# Patient Record
Sex: Female | Born: 1935 | Race: White | Hispanic: No | Smoking: Never smoker
Health system: Southern US, Community
[De-identification: ages and names within clinical notes are randomized; demographics above are authoritative.]

## PROBLEM LIST (undated history)

## (undated) DIAGNOSIS — I10 Essential (primary) hypertension: Secondary | ICD-10-CM

## (undated) DIAGNOSIS — Z9889 Other specified postprocedural states: Secondary | ICD-10-CM

## (undated) DIAGNOSIS — E785 Hyperlipidemia, unspecified: Secondary | ICD-10-CM

## (undated) DIAGNOSIS — F419 Anxiety disorder, unspecified: Secondary | ICD-10-CM

## (undated) DIAGNOSIS — E7800 Pure hypercholesterolemia, unspecified: Secondary | ICD-10-CM

## (undated) DIAGNOSIS — G4730 Sleep apnea, unspecified: Secondary | ICD-10-CM

## (undated) DIAGNOSIS — I739 Peripheral vascular disease, unspecified: Secondary | ICD-10-CM

## (undated) DIAGNOSIS — F32A Depression, unspecified: Secondary | ICD-10-CM

## (undated) DIAGNOSIS — K5790 Diverticulosis of intestine, part unspecified, without perforation or abscess without bleeding: Secondary | ICD-10-CM

## (undated) DIAGNOSIS — R112 Nausea with vomiting, unspecified: Secondary | ICD-10-CM

## (undated) DIAGNOSIS — K219 Gastro-esophageal reflux disease without esophagitis: Secondary | ICD-10-CM

## (undated) HISTORY — PX: HERNIA REPAIR: SHX51

## (undated) HISTORY — PX: CATARACT EXTRACTION: SUR2

## (undated) HISTORY — PX: COLONOSCOPY: SHX174

---

## 1999-01-29 ENCOUNTER — Inpatient Hospital Stay (HOSPITAL_COMMUNITY): Admission: AD | Admit: 1999-01-29 | Discharge: 1999-01-29 | Payer: Self-pay | Admitting: Obstetrics & Gynecology

## 2001-05-01 ENCOUNTER — Ambulatory Visit (HOSPITAL_BASED_OUTPATIENT_CLINIC_OR_DEPARTMENT_OTHER): Admission: RE | Admit: 2001-05-01 | Discharge: 2001-05-01 | Payer: Self-pay | Admitting: *Deleted

## 2002-10-20 ENCOUNTER — Other Ambulatory Visit: Admission: RE | Admit: 2002-10-20 | Discharge: 2002-10-20 | Payer: Self-pay | Admitting: Obstetrics and Gynecology

## 2004-03-01 ENCOUNTER — Other Ambulatory Visit: Admission: RE | Admit: 2004-03-01 | Discharge: 2004-03-01 | Payer: Self-pay | Admitting: Obstetrics and Gynecology

## 2004-10-29 ENCOUNTER — Other Ambulatory Visit: Payer: Self-pay

## 2004-10-29 ENCOUNTER — Emergency Department: Payer: Self-pay | Admitting: Emergency Medicine

## 2005-08-21 ENCOUNTER — Ambulatory Visit: Payer: Self-pay | Admitting: Family Medicine

## 2006-08-23 ENCOUNTER — Ambulatory Visit: Payer: Self-pay | Admitting: Family Medicine

## 2007-01-01 ENCOUNTER — Inpatient Hospital Stay: Payer: Self-pay | Admitting: *Deleted

## 2007-01-27 ENCOUNTER — Emergency Department: Payer: Self-pay | Admitting: Emergency Medicine

## 2007-01-27 ENCOUNTER — Other Ambulatory Visit: Payer: Self-pay

## 2007-02-18 ENCOUNTER — Ambulatory Visit: Payer: Self-pay | Admitting: Unknown Physician Specialty

## 2007-05-29 ENCOUNTER — Emergency Department: Payer: Self-pay | Admitting: Emergency Medicine

## 2007-09-11 ENCOUNTER — Ambulatory Visit: Payer: Self-pay | Admitting: Family Medicine

## 2007-11-21 ENCOUNTER — Emergency Department: Payer: Self-pay | Admitting: Emergency Medicine

## 2007-11-26 ENCOUNTER — Emergency Department: Payer: Self-pay | Admitting: Unknown Physician Specialty

## 2008-02-21 ENCOUNTER — Emergency Department: Payer: Self-pay | Admitting: Emergency Medicine

## 2008-11-10 ENCOUNTER — Ambulatory Visit: Payer: Self-pay | Admitting: Family Medicine

## 2009-04-28 ENCOUNTER — Inpatient Hospital Stay: Payer: Self-pay | Admitting: Internal Medicine

## 2009-06-10 ENCOUNTER — Ambulatory Visit: Payer: Self-pay | Admitting: Family Medicine

## 2010-01-04 ENCOUNTER — Ambulatory Visit: Payer: Self-pay | Admitting: Family Medicine

## 2010-02-16 ENCOUNTER — Ambulatory Visit: Payer: Self-pay | Admitting: Family Medicine

## 2011-01-04 ENCOUNTER — Emergency Department: Payer: Self-pay | Admitting: *Deleted

## 2011-04-11 DIAGNOSIS — Z1339 Encounter for screening examination for other mental health and behavioral disorders: Secondary | ICD-10-CM | POA: Diagnosis not present

## 2011-04-11 DIAGNOSIS — Z23 Encounter for immunization: Secondary | ICD-10-CM | POA: Diagnosis not present

## 2011-04-11 DIAGNOSIS — Z1331 Encounter for screening for depression: Secondary | ICD-10-CM | POA: Diagnosis not present

## 2011-04-11 DIAGNOSIS — Z0000 Encounter for general adult medical examination without abnormal findings: Secondary | ICD-10-CM | POA: Diagnosis not present

## 2011-06-02 ENCOUNTER — Ambulatory Visit: Payer: Self-pay | Admitting: Family Medicine

## 2011-06-02 DIAGNOSIS — Z1231 Encounter for screening mammogram for malignant neoplasm of breast: Secondary | ICD-10-CM | POA: Diagnosis not present

## 2011-06-22 DIAGNOSIS — R1084 Generalized abdominal pain: Secondary | ICD-10-CM | POA: Diagnosis not present

## 2011-06-22 DIAGNOSIS — R112 Nausea with vomiting, unspecified: Secondary | ICD-10-CM | POA: Diagnosis not present

## 2011-06-22 DIAGNOSIS — R109 Unspecified abdominal pain: Secondary | ICD-10-CM | POA: Diagnosis not present

## 2011-06-22 DIAGNOSIS — R509 Fever, unspecified: Secondary | ICD-10-CM | POA: Diagnosis not present

## 2011-06-22 DIAGNOSIS — K5289 Other specified noninfective gastroenteritis and colitis: Secondary | ICD-10-CM | POA: Diagnosis not present

## 2011-06-22 DIAGNOSIS — R197 Diarrhea, unspecified: Secondary | ICD-10-CM | POA: Diagnosis not present

## 2011-07-05 ENCOUNTER — Emergency Department: Payer: Self-pay | Admitting: Unknown Physician Specialty

## 2011-07-05 DIAGNOSIS — K449 Diaphragmatic hernia without obstruction or gangrene: Secondary | ICD-10-CM | POA: Diagnosis not present

## 2011-07-05 DIAGNOSIS — R6889 Other general symptoms and signs: Secondary | ICD-10-CM | POA: Diagnosis not present

## 2011-07-05 DIAGNOSIS — Z7982 Long term (current) use of aspirin: Secondary | ICD-10-CM | POA: Diagnosis not present

## 2011-07-05 DIAGNOSIS — R109 Unspecified abdominal pain: Secondary | ICD-10-CM | POA: Diagnosis not present

## 2011-07-05 DIAGNOSIS — Z79899 Other long term (current) drug therapy: Secondary | ICD-10-CM | POA: Diagnosis not present

## 2011-07-05 DIAGNOSIS — R197 Diarrhea, unspecified: Secondary | ICD-10-CM | POA: Diagnosis not present

## 2011-07-05 DIAGNOSIS — E869 Volume depletion, unspecified: Secondary | ICD-10-CM | POA: Diagnosis not present

## 2011-07-05 LAB — CBC
MCH: 29.4 pg (ref 26.0–34.0)
MCHC: 32.4 g/dL (ref 32.0–36.0)
RBC: 4.41 10*6/uL (ref 3.80–5.20)
WBC: 11.8 10*3/uL — ABNORMAL HIGH (ref 3.6–11.0)

## 2011-07-05 LAB — COMPREHENSIVE METABOLIC PANEL
Albumin: 3.4 g/dL (ref 3.4–5.0)
Alkaline Phosphatase: 53 U/L (ref 50–136)
Chloride: 106 mmol/L (ref 98–107)
Co2: 25 mmol/L (ref 21–32)
Creatinine: 0.9 mg/dL (ref 0.60–1.30)
EGFR (African American): >60
EGFR (Non-African Amer.): >60
Osmolality: 282 (ref 275–301)
SGOT(AST): 26 U/L (ref 15–37)
Total Protein: 7.1 g/dL (ref 6.4–8.2)

## 2011-07-07 LAB — STOOL CULTURE

## 2011-07-10 DIAGNOSIS — F411 Generalized anxiety disorder: Secondary | ICD-10-CM | POA: Diagnosis not present

## 2011-07-10 DIAGNOSIS — F329 Major depressive disorder, single episode, unspecified: Secondary | ICD-10-CM | POA: Diagnosis not present

## 2011-07-10 DIAGNOSIS — K2100 Gastro-esophageal reflux disease with esophagitis, without bleeding: Secondary | ICD-10-CM | POA: Diagnosis not present

## 2011-07-10 DIAGNOSIS — R911 Solitary pulmonary nodule: Secondary | ICD-10-CM | POA: Diagnosis not present

## 2011-07-10 DIAGNOSIS — F3289 Other specified depressive episodes: Secondary | ICD-10-CM | POA: Diagnosis not present

## 2011-10-30 DIAGNOSIS — G4733 Obstructive sleep apnea (adult) (pediatric): Secondary | ICD-10-CM | POA: Diagnosis not present

## 2011-10-30 DIAGNOSIS — F3289 Other specified depressive episodes: Secondary | ICD-10-CM | POA: Diagnosis not present

## 2011-10-30 DIAGNOSIS — E785 Hyperlipidemia, unspecified: Secondary | ICD-10-CM | POA: Diagnosis not present

## 2011-10-30 DIAGNOSIS — F411 Generalized anxiety disorder: Secondary | ICD-10-CM | POA: Diagnosis not present

## 2011-10-30 DIAGNOSIS — F329 Major depressive disorder, single episode, unspecified: Secondary | ICD-10-CM | POA: Diagnosis not present

## 2011-12-02 DIAGNOSIS — Z23 Encounter for immunization: Secondary | ICD-10-CM | POA: Diagnosis not present

## 2011-12-02 DIAGNOSIS — G4733 Obstructive sleep apnea (adult) (pediatric): Secondary | ICD-10-CM | POA: Diagnosis not present

## 2011-12-02 DIAGNOSIS — E785 Hyperlipidemia, unspecified: Secondary | ICD-10-CM | POA: Diagnosis not present

## 2012-02-27 DIAGNOSIS — Z23 Encounter for immunization: Secondary | ICD-10-CM | POA: Diagnosis not present

## 2012-02-27 DIAGNOSIS — Z1331 Encounter for screening for depression: Secondary | ICD-10-CM | POA: Diagnosis not present

## 2012-02-27 DIAGNOSIS — G4733 Obstructive sleep apnea (adult) (pediatric): Secondary | ICD-10-CM | POA: Diagnosis not present

## 2012-02-27 DIAGNOSIS — E785 Hyperlipidemia, unspecified: Secondary | ICD-10-CM | POA: Diagnosis not present

## 2012-02-27 DIAGNOSIS — Z1339 Encounter for screening examination for other mental health and behavioral disorders: Secondary | ICD-10-CM | POA: Diagnosis not present

## 2012-02-27 DIAGNOSIS — Z0000 Encounter for general adult medical examination without abnormal findings: Secondary | ICD-10-CM | POA: Diagnosis not present

## 2012-03-11 ENCOUNTER — Ambulatory Visit: Payer: Self-pay | Admitting: Family Medicine

## 2012-03-11 DIAGNOSIS — N951 Menopausal and female climacteric states: Secondary | ICD-10-CM | POA: Diagnosis not present

## 2012-03-11 DIAGNOSIS — M899 Disorder of bone, unspecified: Secondary | ICD-10-CM | POA: Diagnosis not present

## 2012-04-19 DIAGNOSIS — H2510 Age-related nuclear cataract, unspecified eye: Secondary | ICD-10-CM | POA: Diagnosis not present

## 2012-04-25 DIAGNOSIS — F3289 Other specified depressive episodes: Secondary | ICD-10-CM | POA: Diagnosis not present

## 2012-04-25 DIAGNOSIS — I10 Essential (primary) hypertension: Secondary | ICD-10-CM | POA: Diagnosis not present

## 2012-04-25 DIAGNOSIS — E7800 Pure hypercholesterolemia, unspecified: Secondary | ICD-10-CM | POA: Diagnosis not present

## 2012-04-25 DIAGNOSIS — F411 Generalized anxiety disorder: Secondary | ICD-10-CM | POA: Diagnosis not present

## 2012-04-26 DIAGNOSIS — R5381 Other malaise: Secondary | ICD-10-CM | POA: Diagnosis not present

## 2012-04-26 DIAGNOSIS — E7800 Pure hypercholesterolemia, unspecified: Secondary | ICD-10-CM | POA: Diagnosis not present

## 2012-05-20 DIAGNOSIS — E7800 Pure hypercholesterolemia, unspecified: Secondary | ICD-10-CM | POA: Diagnosis not present

## 2012-05-20 DIAGNOSIS — R350 Frequency of micturition: Secondary | ICD-10-CM | POA: Diagnosis not present

## 2012-05-20 DIAGNOSIS — R002 Palpitations: Secondary | ICD-10-CM | POA: Diagnosis not present

## 2012-05-20 DIAGNOSIS — I10 Essential (primary) hypertension: Secondary | ICD-10-CM | POA: Diagnosis not present

## 2012-06-26 ENCOUNTER — Emergency Department: Payer: Self-pay

## 2012-06-26 DIAGNOSIS — I10 Essential (primary) hypertension: Secondary | ICD-10-CM | POA: Diagnosis not present

## 2012-06-26 DIAGNOSIS — R51 Headache: Secondary | ICD-10-CM | POA: Diagnosis not present

## 2012-06-26 DIAGNOSIS — Z888 Allergy status to other drugs, medicaments and biological substances status: Secondary | ICD-10-CM | POA: Diagnosis not present

## 2012-06-26 DIAGNOSIS — R6889 Other general symptoms and signs: Secondary | ICD-10-CM | POA: Diagnosis not present

## 2012-06-26 LAB — COMPREHENSIVE METABOLIC PANEL
Albumin: 3.7 g/dL (ref 3.4–5.0)
BUN: 15 mg/dL (ref 7–18)
Co2: 28 mmol/L (ref 21–32)
Creatinine: 0.83 mg/dL (ref 0.60–1.30)
EGFR (African American): >60
EGFR (Non-African Amer.): >60
Glucose: 92 mg/dL (ref 65–99)
Osmolality: 276 (ref 275–301)
Potassium: 3.9 mmol/L (ref 3.5–5.1)
SGOT(AST): 25 U/L (ref 15–37)
Sodium: 138 mmol/L (ref 136–145)
Total Protein: 7.7 g/dL (ref 6.4–8.2)

## 2012-06-26 LAB — URINALYSIS, COMPLETE
Bacteria: NONE SEEN
Glucose,UR: NEGATIVE mg/dL (ref 0–75)
Leukocyte Esterase: NEGATIVE
Nitrite: NEGATIVE
Ph: 6 (ref 4.5–8.0)
Protein: NEGATIVE
RBC,UR: 2 /HPF (ref 0–5)
WBC UR: 1 /HPF (ref 0–5)

## 2012-06-26 LAB — CBC WITH DIFFERENTIAL/PLATELET
Basophil #: 0.1 10*3/uL (ref 0.0–0.1)
Basophil %: 1.2 %
Eosinophil #: 0.5 10*3/uL (ref 0.0–0.7)
HCT: 40 % (ref 35.0–47.0)
Lymphocyte #: 0.6 10*3/uL — ABNORMAL LOW (ref 1.0–3.6)
MCH: 30 pg (ref 26.0–34.0)
MCHC: 33.8 g/dL (ref 32.0–36.0)
Monocyte %: 19.1 %
Neutrophil #: 5.4 10*3/uL (ref 1.4–6.5)
Platelet: 244 10*3/uL (ref 150–440)
RBC: 4.51 10*6/uL (ref 3.80–5.20)

## 2012-06-27 ENCOUNTER — Ambulatory Visit: Payer: Self-pay | Admitting: Family Medicine

## 2012-06-27 DIAGNOSIS — F329 Major depressive disorder, single episode, unspecified: Secondary | ICD-10-CM | POA: Diagnosis not present

## 2012-06-27 DIAGNOSIS — R509 Fever, unspecified: Secondary | ICD-10-CM | POA: Diagnosis not present

## 2012-06-27 DIAGNOSIS — F3289 Other specified depressive episodes: Secondary | ICD-10-CM | POA: Diagnosis not present

## 2012-06-27 DIAGNOSIS — J029 Acute pharyngitis, unspecified: Secondary | ICD-10-CM | POA: Diagnosis not present

## 2012-06-27 DIAGNOSIS — R059 Cough, unspecified: Secondary | ICD-10-CM | POA: Diagnosis not present

## 2012-06-27 DIAGNOSIS — R51 Headache: Secondary | ICD-10-CM | POA: Diagnosis not present

## 2012-06-27 DIAGNOSIS — R05 Cough: Secondary | ICD-10-CM | POA: Diagnosis not present

## 2012-06-28 LAB — BETA STREP CULTURE(ARMC)

## 2012-07-01 ENCOUNTER — Emergency Department: Payer: Self-pay | Admitting: Emergency Medicine

## 2012-07-01 DIAGNOSIS — R6889 Other general symptoms and signs: Secondary | ICD-10-CM | POA: Diagnosis not present

## 2012-07-01 DIAGNOSIS — Z888 Allergy status to other drugs, medicaments and biological substances status: Secondary | ICD-10-CM | POA: Diagnosis not present

## 2012-07-01 DIAGNOSIS — E785 Hyperlipidemia, unspecified: Secondary | ICD-10-CM | POA: Diagnosis not present

## 2012-07-01 DIAGNOSIS — I10 Essential (primary) hypertension: Secondary | ICD-10-CM | POA: Diagnosis not present

## 2012-07-01 LAB — URINALYSIS, COMPLETE
Bacteria: NONE SEEN
Blood: NEGATIVE
Ketone: NEGATIVE
Leukocyte Esterase: NEGATIVE
Nitrite: NEGATIVE
Ph: 7 (ref 4.5–8.0)
Protein: NEGATIVE
Squamous Epithelial: <1
WBC UR: 1 /HPF (ref 0–5)

## 2012-07-01 LAB — COMPREHENSIVE METABOLIC PANEL
Albumin: 3.5 g/dL (ref 3.4–5.0)
Alkaline Phosphatase: 89 U/L (ref 50–136)
Anion Gap: 9 (ref 7–16)
BUN: 17 mg/dL (ref 7–18)
Bilirubin,Total: 0.4 mg/dL (ref 0.2–1.0)
Calcium, Total: 9.4 mg/dL (ref 8.5–10.1)
Creatinine: 0.97 mg/dL (ref 0.60–1.30)
EGFR (African American): >60
EGFR (Non-African Amer.): 56 — ABNORMAL LOW
Glucose: 95 mg/dL (ref 65–99)
Potassium: 3.8 mmol/L (ref 3.5–5.1)
SGOT(AST): 24 U/L (ref 15–37)
SGPT (ALT): 24 U/L (ref 12–78)
Sodium: 140 mmol/L (ref 136–145)
Total Protein: 7.4 g/dL (ref 6.4–8.2)

## 2012-07-01 LAB — CBC
HCT: 39.9 % (ref 35.0–47.0)
MCH: 30.6 pg (ref 26.0–34.0)
MCHC: 34.7 g/dL (ref 32.0–36.0)
MCV: 88 fL (ref 80–100)
Platelet: 246 10*3/uL (ref 150–440)
WBC: 6 10*3/uL (ref 3.6–11.0)

## 2012-07-01 LAB — CK TOTAL AND CKMB (NOT AT ARMC)
CK, Total: 55 U/L (ref 21–215)
CK, Total: 47 U/L (ref 21–215)
CK-MB: 0.6 ng/mL (ref 0.5–3.6)

## 2012-07-01 LAB — TROPONIN I: Troponin-I: <0.02 ng/mL

## 2012-07-02 DIAGNOSIS — R079 Chest pain, unspecified: Secondary | ICD-10-CM | POA: Diagnosis not present

## 2012-07-02 DIAGNOSIS — R0609 Other forms of dyspnea: Secondary | ICD-10-CM | POA: Diagnosis not present

## 2012-07-02 DIAGNOSIS — R0989 Other specified symptoms and signs involving the circulatory and respiratory systems: Secondary | ICD-10-CM | POA: Diagnosis not present

## 2012-07-02 DIAGNOSIS — R9431 Abnormal electrocardiogram [ECG] [EKG]: Secondary | ICD-10-CM | POA: Diagnosis not present

## 2012-07-02 DIAGNOSIS — I10 Essential (primary) hypertension: Secondary | ICD-10-CM | POA: Diagnosis not present

## 2012-07-05 DIAGNOSIS — R0609 Other forms of dyspnea: Secondary | ICD-10-CM | POA: Diagnosis not present

## 2012-07-05 DIAGNOSIS — R0989 Other specified symptoms and signs involving the circulatory and respiratory systems: Secondary | ICD-10-CM | POA: Diagnosis not present

## 2012-07-05 DIAGNOSIS — I10 Essential (primary) hypertension: Secondary | ICD-10-CM | POA: Diagnosis not present

## 2012-07-05 DIAGNOSIS — R5381 Other malaise: Secondary | ICD-10-CM | POA: Diagnosis not present

## 2012-07-05 DIAGNOSIS — R5383 Other fatigue: Secondary | ICD-10-CM | POA: Diagnosis not present

## 2012-08-01 DIAGNOSIS — F3289 Other specified depressive episodes: Secondary | ICD-10-CM | POA: Diagnosis not present

## 2012-08-01 DIAGNOSIS — R9431 Abnormal electrocardiogram [ECG] [EKG]: Secondary | ICD-10-CM | POA: Diagnosis not present

## 2012-08-01 DIAGNOSIS — I10 Essential (primary) hypertension: Secondary | ICD-10-CM | POA: Diagnosis not present

## 2012-08-01 DIAGNOSIS — F329 Major depressive disorder, single episode, unspecified: Secondary | ICD-10-CM | POA: Diagnosis not present

## 2012-08-01 DIAGNOSIS — F411 Generalized anxiety disorder: Secondary | ICD-10-CM | POA: Diagnosis not present

## 2012-08-02 DIAGNOSIS — R0602 Shortness of breath: Secondary | ICD-10-CM | POA: Diagnosis not present

## 2012-08-02 DIAGNOSIS — R509 Fever, unspecified: Secondary | ICD-10-CM | POA: Diagnosis not present

## 2012-08-02 DIAGNOSIS — I658 Occlusion and stenosis of other precerebral arteries: Secondary | ICD-10-CM | POA: Diagnosis not present

## 2012-08-12 DIAGNOSIS — I10 Essential (primary) hypertension: Secondary | ICD-10-CM | POA: Diagnosis not present

## 2012-08-12 DIAGNOSIS — E782 Mixed hyperlipidemia: Secondary | ICD-10-CM | POA: Diagnosis not present

## 2012-08-29 ENCOUNTER — Ambulatory Visit: Payer: Self-pay | Admitting: Family Medicine

## 2012-08-29 DIAGNOSIS — F411 Generalized anxiety disorder: Secondary | ICD-10-CM | POA: Diagnosis not present

## 2012-08-29 DIAGNOSIS — Z1231 Encounter for screening mammogram for malignant neoplasm of breast: Secondary | ICD-10-CM | POA: Diagnosis not present

## 2012-08-29 DIAGNOSIS — N3000 Acute cystitis without hematuria: Secondary | ICD-10-CM | POA: Diagnosis not present

## 2012-08-29 DIAGNOSIS — R9431 Abnormal electrocardiogram [ECG] [EKG]: Secondary | ICD-10-CM | POA: Diagnosis not present

## 2012-08-29 DIAGNOSIS — F329 Major depressive disorder, single episode, unspecified: Secondary | ICD-10-CM | POA: Diagnosis not present

## 2012-08-29 DIAGNOSIS — F3289 Other specified depressive episodes: Secondary | ICD-10-CM | POA: Diagnosis not present

## 2012-11-02 DIAGNOSIS — R9431 Abnormal electrocardiogram [ECG] [EKG]: Secondary | ICD-10-CM | POA: Diagnosis not present

## 2012-11-02 DIAGNOSIS — F3289 Other specified depressive episodes: Secondary | ICD-10-CM | POA: Diagnosis not present

## 2012-11-02 DIAGNOSIS — F329 Major depressive disorder, single episode, unspecified: Secondary | ICD-10-CM | POA: Diagnosis not present

## 2012-11-02 DIAGNOSIS — F411 Generalized anxiety disorder: Secondary | ICD-10-CM | POA: Diagnosis not present

## 2012-11-02 DIAGNOSIS — Z23 Encounter for immunization: Secondary | ICD-10-CM | POA: Diagnosis not present

## 2012-11-02 DIAGNOSIS — N3000 Acute cystitis without hematuria: Secondary | ICD-10-CM | POA: Diagnosis not present

## 2013-02-04 DIAGNOSIS — Z23 Encounter for immunization: Secondary | ICD-10-CM | POA: Diagnosis not present

## 2013-02-04 DIAGNOSIS — F411 Generalized anxiety disorder: Secondary | ICD-10-CM | POA: Diagnosis not present

## 2013-02-04 DIAGNOSIS — K2100 Gastro-esophageal reflux disease with esophagitis, without bleeding: Secondary | ICD-10-CM | POA: Diagnosis not present

## 2013-02-04 DIAGNOSIS — I10 Essential (primary) hypertension: Secondary | ICD-10-CM | POA: Diagnosis not present

## 2013-02-04 DIAGNOSIS — F320 Major depressive disorder, single episode, mild: Secondary | ICD-10-CM | POA: Diagnosis not present

## 2013-04-21 DIAGNOSIS — E782 Mixed hyperlipidemia: Secondary | ICD-10-CM | POA: Diagnosis not present

## 2013-04-21 DIAGNOSIS — I10 Essential (primary) hypertension: Secondary | ICD-10-CM | POA: Diagnosis not present

## 2013-04-22 DIAGNOSIS — H2510 Age-related nuclear cataract, unspecified eye: Secondary | ICD-10-CM | POA: Diagnosis not present

## 2013-05-27 DIAGNOSIS — H2510 Age-related nuclear cataract, unspecified eye: Secondary | ICD-10-CM | POA: Diagnosis not present

## 2013-05-29 ENCOUNTER — Ambulatory Visit: Payer: Self-pay | Admitting: Ophthalmology

## 2013-05-29 DIAGNOSIS — H25049 Posterior subcapsular polar age-related cataract, unspecified eye: Secondary | ICD-10-CM | POA: Diagnosis not present

## 2013-05-29 DIAGNOSIS — Z01810 Encounter for preprocedural cardiovascular examination: Secondary | ICD-10-CM | POA: Diagnosis not present

## 2013-05-29 DIAGNOSIS — I10 Essential (primary) hypertension: Secondary | ICD-10-CM | POA: Diagnosis not present

## 2013-06-02 ENCOUNTER — Ambulatory Visit: Payer: Self-pay | Admitting: Ophthalmology

## 2013-06-02 DIAGNOSIS — Z79899 Other long term (current) drug therapy: Secondary | ICD-10-CM | POA: Diagnosis not present

## 2013-06-02 DIAGNOSIS — H269 Unspecified cataract: Secondary | ICD-10-CM | POA: Diagnosis not present

## 2013-06-02 DIAGNOSIS — H25049 Posterior subcapsular polar age-related cataract, unspecified eye: Secondary | ICD-10-CM | POA: Diagnosis not present

## 2013-06-02 DIAGNOSIS — E7800 Pure hypercholesterolemia, unspecified: Secondary | ICD-10-CM | POA: Diagnosis not present

## 2013-06-02 DIAGNOSIS — H2510 Age-related nuclear cataract, unspecified eye: Secondary | ICD-10-CM | POA: Diagnosis not present

## 2013-06-02 DIAGNOSIS — G4730 Sleep apnea, unspecified: Secondary | ICD-10-CM | POA: Diagnosis not present

## 2013-06-02 DIAGNOSIS — K219 Gastro-esophageal reflux disease without esophagitis: Secondary | ICD-10-CM | POA: Diagnosis not present

## 2013-06-02 DIAGNOSIS — I10 Essential (primary) hypertension: Secondary | ICD-10-CM | POA: Diagnosis not present

## 2013-06-13 DIAGNOSIS — H2510 Age-related nuclear cataract, unspecified eye: Secondary | ICD-10-CM | POA: Diagnosis not present

## 2013-06-23 ENCOUNTER — Ambulatory Visit: Payer: Self-pay | Admitting: Ophthalmology

## 2013-06-23 DIAGNOSIS — G4730 Sleep apnea, unspecified: Secondary | ICD-10-CM | POA: Diagnosis not present

## 2013-06-23 DIAGNOSIS — F329 Major depressive disorder, single episode, unspecified: Secondary | ICD-10-CM | POA: Diagnosis not present

## 2013-06-23 DIAGNOSIS — H269 Unspecified cataract: Secondary | ICD-10-CM | POA: Diagnosis not present

## 2013-06-23 DIAGNOSIS — Z881 Allergy status to other antibiotic agents status: Secondary | ICD-10-CM | POA: Diagnosis not present

## 2013-06-23 DIAGNOSIS — I10 Essential (primary) hypertension: Secondary | ICD-10-CM | POA: Diagnosis not present

## 2013-06-23 DIAGNOSIS — Z79899 Other long term (current) drug therapy: Secondary | ICD-10-CM | POA: Diagnosis not present

## 2013-06-23 DIAGNOSIS — E7800 Pure hypercholesterolemia, unspecified: Secondary | ICD-10-CM | POA: Diagnosis not present

## 2013-06-23 DIAGNOSIS — H2510 Age-related nuclear cataract, unspecified eye: Secondary | ICD-10-CM | POA: Diagnosis not present

## 2013-06-23 DIAGNOSIS — F3289 Other specified depressive episodes: Secondary | ICD-10-CM | POA: Diagnosis not present

## 2013-08-04 DIAGNOSIS — F320 Major depressive disorder, single episode, mild: Secondary | ICD-10-CM | POA: Diagnosis not present

## 2013-08-04 DIAGNOSIS — F411 Generalized anxiety disorder: Secondary | ICD-10-CM | POA: Diagnosis not present

## 2013-08-04 DIAGNOSIS — I10 Essential (primary) hypertension: Secondary | ICD-10-CM | POA: Diagnosis not present

## 2013-08-04 DIAGNOSIS — Z23 Encounter for immunization: Secondary | ICD-10-CM | POA: Diagnosis not present

## 2013-08-04 DIAGNOSIS — K2100 Gastro-esophageal reflux disease with esophagitis, without bleeding: Secondary | ICD-10-CM | POA: Diagnosis not present

## 2013-08-06 DIAGNOSIS — D649 Anemia, unspecified: Secondary | ICD-10-CM | POA: Diagnosis not present

## 2013-08-06 DIAGNOSIS — E7800 Pure hypercholesterolemia, unspecified: Secondary | ICD-10-CM | POA: Diagnosis not present

## 2013-08-06 DIAGNOSIS — R5383 Other fatigue: Secondary | ICD-10-CM | POA: Diagnosis not present

## 2013-08-06 DIAGNOSIS — R5381 Other malaise: Secondary | ICD-10-CM | POA: Diagnosis not present

## 2013-10-28 DIAGNOSIS — K2100 Gastro-esophageal reflux disease with esophagitis, without bleeding: Secondary | ICD-10-CM | POA: Diagnosis not present

## 2013-10-28 DIAGNOSIS — Z111 Encounter for screening for respiratory tuberculosis: Secondary | ICD-10-CM | POA: Diagnosis not present

## 2013-10-28 DIAGNOSIS — Z0000 Encounter for general adult medical examination without abnormal findings: Secondary | ICD-10-CM | POA: Diagnosis not present

## 2013-10-28 DIAGNOSIS — F411 Generalized anxiety disorder: Secondary | ICD-10-CM | POA: Diagnosis not present

## 2013-10-28 DIAGNOSIS — Z23 Encounter for immunization: Secondary | ICD-10-CM | POA: Diagnosis not present

## 2013-10-28 DIAGNOSIS — I10 Essential (primary) hypertension: Secondary | ICD-10-CM | POA: Diagnosis not present

## 2013-10-28 DIAGNOSIS — Z1339 Encounter for screening examination for other mental health and behavioral disorders: Secondary | ICD-10-CM | POA: Diagnosis not present

## 2013-10-28 DIAGNOSIS — Z1331 Encounter for screening for depression: Secondary | ICD-10-CM | POA: Diagnosis not present

## 2013-11-24 ENCOUNTER — Ambulatory Visit: Payer: Self-pay | Admitting: Family Medicine

## 2013-11-24 DIAGNOSIS — Z1231 Encounter for screening mammogram for malignant neoplasm of breast: Secondary | ICD-10-CM | POA: Diagnosis not present

## 2013-12-16 DIAGNOSIS — E780 Pure hypercholesterolemia: Secondary | ICD-10-CM | POA: Diagnosis not present

## 2013-12-16 DIAGNOSIS — I10 Essential (primary) hypertension: Secondary | ICD-10-CM | POA: Diagnosis not present

## 2013-12-16 DIAGNOSIS — F419 Anxiety disorder, unspecified: Secondary | ICD-10-CM | POA: Diagnosis not present

## 2013-12-16 DIAGNOSIS — I495 Sick sinus syndrome: Secondary | ICD-10-CM | POA: Diagnosis not present

## 2013-12-16 DIAGNOSIS — G4730 Sleep apnea, unspecified: Secondary | ICD-10-CM | POA: Insufficient documentation

## 2013-12-26 DIAGNOSIS — Z961 Presence of intraocular lens: Secondary | ICD-10-CM | POA: Diagnosis not present

## 2014-04-27 ENCOUNTER — Ambulatory Visit: Payer: Self-pay | Admitting: Family Medicine

## 2014-04-27 DIAGNOSIS — R05 Cough: Secondary | ICD-10-CM | POA: Diagnosis not present

## 2014-04-27 DIAGNOSIS — I10 Essential (primary) hypertension: Secondary | ICD-10-CM | POA: Diagnosis not present

## 2014-04-27 DIAGNOSIS — K219 Gastro-esophageal reflux disease without esophagitis: Secondary | ICD-10-CM | POA: Diagnosis not present

## 2014-04-27 DIAGNOSIS — R062 Wheezing: Secondary | ICD-10-CM | POA: Diagnosis not present

## 2014-04-27 DIAGNOSIS — J309 Allergic rhinitis, unspecified: Secondary | ICD-10-CM | POA: Diagnosis not present

## 2014-04-27 DIAGNOSIS — F329 Major depressive disorder, single episode, unspecified: Secondary | ICD-10-CM | POA: Diagnosis not present

## 2014-04-28 DIAGNOSIS — D649 Anemia, unspecified: Secondary | ICD-10-CM | POA: Diagnosis not present

## 2014-04-28 DIAGNOSIS — I10 Essential (primary) hypertension: Secondary | ICD-10-CM | POA: Diagnosis not present

## 2014-04-28 DIAGNOSIS — E780 Pure hypercholesterolemia: Secondary | ICD-10-CM | POA: Diagnosis not present

## 2014-04-28 LAB — LIPID PANEL
CHOLESTEROL: 179 mg/dL (ref 0–200)
HDL: 49 mg/dL (ref 35–70)
LDL CALC: 110 mg/dL
TRIGLYCERIDES: 101 mg/dL (ref 40–160)

## 2014-04-28 LAB — CBC AND DIFFERENTIAL
HCT: 38 % (ref 36–46)
Hemoglobin: 12.6 g/dL (ref 12.0–16.0)
NEUTROS ABS: 46 /uL
PLATELETS: 292 10*3/uL (ref 150–399)
WBC: 6.5 10^3/mL

## 2014-04-28 LAB — HEPATIC FUNCTION PANEL
ALT: 11 U/L (ref 7–35)
AST: 14 U/L (ref 13–35)
Alkaline Phosphatase: 72 U/L (ref 25–125)
Bilirubin, Total: 0.3 mg/dL

## 2014-04-28 LAB — BASIC METABOLIC PANEL
BUN: 15 mg/dL (ref 4–21)
Creatinine: 0.9 mg/dL (ref 0.5–1.1)
GLUCOSE: 94 mg/dL
Potassium: 4.4 mmol/L (ref 3.4–5.3)
Sodium: 143 mmol/L (ref 137–147)

## 2014-04-28 LAB — TSH: TSH: 1.52 u[IU]/mL (ref 0.41–5.90)

## 2014-05-11 DIAGNOSIS — Z961 Presence of intraocular lens: Secondary | ICD-10-CM | POA: Diagnosis not present

## 2014-05-19 DIAGNOSIS — K219 Gastro-esophageal reflux disease without esophagitis: Secondary | ICD-10-CM | POA: Diagnosis not present

## 2014-05-19 DIAGNOSIS — F329 Major depressive disorder, single episode, unspecified: Secondary | ICD-10-CM | POA: Diagnosis not present

## 2014-05-19 DIAGNOSIS — J309 Allergic rhinitis, unspecified: Secondary | ICD-10-CM | POA: Diagnosis not present

## 2014-05-19 DIAGNOSIS — R05 Cough: Secondary | ICD-10-CM | POA: Diagnosis not present

## 2014-05-19 DIAGNOSIS — I10 Essential (primary) hypertension: Secondary | ICD-10-CM | POA: Diagnosis not present

## 2014-05-30 NOTE — Op Note (Signed)
PATIENT NAME:  Kathy Pacheco, Kathy Pacheco MR#:  [id number] DATE OF BIRTH:  1931-12-04  DATE OF PROCEDURE:  06/23/2013  PREOPERATIVE DIAGNOSIS:  Cataract, right eye.   POSTOPERATIVE DIAGNOSIS:  Cataract, right eye.  PROCEDURE PERFORMED:  Extracapsular cataract extraction using phacoemulsification with placement of an Alcon SN6CWS, 22.0-diopter posterior chamber lens, serial [id number].  SURGEON:  Loura Back. Brylin Stopper, MD  ASSISTANT:  None.  ANESTHESIA:  4% lidocaine and 0.75% Marcaine in a 50/50 mixture with 10 units/mL of Hylenex added, given as a peribulbar.   ANESTHESIOLOGIST:  Dr. Benjamine Mola  COMPLICATIONS:  None.  ESTIMATED BLOOD LOSS:  Less than 1 ml.  DESCRIPTION OF PROCEDURE:  The patient was brought to the operating room and given a peribulbar block.  The patient was then prepped and draped in the usual fashion.  The vertical rectus muscles were imbricated using 5-0 silk sutures.  These sutures were then clamped to the sterile drapes as bridle sutures.  A limbal peritomy was performed extending two clock hours and hemostasis was obtained with cautery.  A partial thickness scleral groove was made at the surgical limbus and dissected anteriorly in a lamellar dissection using an Alcon crescent knife.  The anterior chamber was entered superonasally with a Superblade and through the lamellar dissection with a 2.6 mm keratome.  DisCoVisc was used to replace the aqueous and a continuous tear capsulorrhexis was carried out.  Hydrodissection and hydrodelineation were carried out with balanced salt and a 27 gauge canula.  The nucleus was rotated to confirm the effectiveness of the hydrodissection.  Phacoemulsification was carried out using a divide-and-conquer technique.  Total ultrasound time was 1 minute and 17 seconds with an average power of 23.8 percent and CDE of 30.36.  Irrigation/aspiration was used to remove the residual cortex.  DisCoVisc was used to inflate the capsule and the internal incision  was enlarged to 3 mm with the crescent knife.  The intraocular lens was folded and inserted into the capsular bag using the AcrySert delivery system. Irrigation/aspiration was used to remove the residual DisCoVisc.  Miostat was injected into the anterior chamber through the paracentesis track to inflate the anterior chamber and induce miosis.  A tenth of a milliliter of cefuroxime was injected via the paracentesis tract containing 1 mg of drug. The wound was checked for leaks and none were found. The conjunctiva was closed with cautery and the bridle sutures were removed.  Two drops of 0.3% Vigamox were placed on the eye.   An eye shield was placed on the eye.  The patient was discharged to the recovery room in good condition.  ____________________________ Loura Back Karisma Meiser, MD sad:sb D: 06/23/2013 13:38:07 ET T: 06/23/2013 14:22:04 ET JOB#: [id number]  cc: Remo Lipps A. Wyeth Hoffer, MD, <Dictator> Martie Lee MD ELECTRONICALLY SIGNED 07/14/2013 13:50

## 2014-05-30 NOTE — Op Note (Signed)
PATIENT NAME:  Kathy Pacheco, Kathy Pacheco MR#:  [id number] DATE OF BIRTH:  Feb 25, 1931  DATE OF PROCEDURE:  06/02/2013  PREOPERATIVE DIAGNOSIS:  Cataract, left eye.    POSTOPERATIVE DIAGNOSIS:  Cataract, left eye.  PROCEDURE PERFORMED:  Extracapsular cataract extraction using phacoemulsification with placement of an Alcon SN6CWS, 21.5-diopter posterior chamber lens, serial #[id number].  SURGEON:  Loura Back. Breckan Cafiero, MD  ASSISTANT:  None.  ANESTHESIA:  4% lidocaine and 0.75% Marcaine in a 50/50 mixture with 10 units/mL of Hylenex added, given as a peribulbar.   ANESTHESIOLOGIST:  Dr. Myra Gianotti  COMPLICATIONS:  None.  ESTIMATED BLOOD LOSS:  Less than 1 ml.  DESCRIPTION OF PROCEDURE:  The patient was brought to the operating room and given a peribulbar block.  The patient was then prepped and draped in the usual fashion.  The vertical rectus muscles were imbricated using 5-0 silk sutures.  These sutures were then clamped to the sterile drapes as bridle sutures.  A limbal peritomy was performed extending two clock hours and hemostasis was obtained with cautery.  A partial thickness scleral groove was made at the surgical limbus and dissected anteriorly in a lamellar dissection using an Alcon crescent knife.  The anterior chamber was entered supero-temporally with a Superblade and through the lamellar dissection with a 2.6 mm keratome.  DisCoVisc was used to replace the aqueous and a continuous tear capsulorrhexis was carried out.  Hydrodissection and hydrodelineation were carried out with balanced salt and a 27 gauge canula.  The nucleus was rotated to confirm the effectiveness of the hydrodissection.  Phacoemulsification was carried out using a divide-and-conquer technique.  Total ultrasound time was 1 minute and 37 seconds with an average power of 26.5 percent and a CDE of 42.20.  Irrigation/aspiration was used to remove the residual cortex.  DisCoVisc was used to inflate the capsule and the internal  incision was enlarged to 3 mm with the crescent knife.  The intraocular lens was folded and inserted into the capsular bag using the AcrySert delivery system. Irrigation/aspiration was used to remove the residual DisCoVisc.  Miostat was injected into the anterior chamber through the paracentesis track to inflate the anterior chamber and induce miosis. A tenth of a milliliter of cefuroxime containing 1 mg of drug was injected via the paracentesis tract. The wound was checked for leaks and none were found. The conjunctiva was closed with cautery and the bridle sutures were removed.  Two drops of 0.3% Vigamox were placed on the eye.   An eye shield was placed on the eye.  The patient was discharged to the recovery room in good condition.  ____________________________ Loura Back Lawson Isabell, MD sad:sb D: 06/02/2013 12:53:38 ET T: 06/02/2013 13:19:22 ET JOB#: [id number]  cc: Remo Lipps A. Erum Cercone, MD, <Dictator> Martie Lee MD ELECTRONICALLY SIGNED 06/09/2013 9:11

## 2014-06-17 DIAGNOSIS — I10 Essential (primary) hypertension: Secondary | ICD-10-CM | POA: Diagnosis not present

## 2014-06-17 DIAGNOSIS — E780 Pure hypercholesterolemia: Secondary | ICD-10-CM | POA: Diagnosis not present

## 2014-06-17 DIAGNOSIS — G4733 Obstructive sleep apnea (adult) (pediatric): Secondary | ICD-10-CM | POA: Diagnosis not present

## 2014-06-26 DIAGNOSIS — Z961 Presence of intraocular lens: Secondary | ICD-10-CM | POA: Diagnosis not present

## 2014-08-04 ENCOUNTER — Other Ambulatory Visit: Payer: Self-pay | Admitting: Family Medicine

## 2014-10-01 ENCOUNTER — Other Ambulatory Visit: Payer: Self-pay

## 2014-10-01 MED ORDER — FLUTICASONE PROPIONATE 50 MCG/ACT NA SUSP: 2.0000 | Freq: Every day | NASAL | Status: DC

## 2014-10-01 MED ORDER — CITALOPRAM HYDROBROMIDE 10 MG PO TABS: 10.0000 mg | ORAL_TABLET | Freq: Every day | ORAL | Status: DC

## 2014-10-01 MED ORDER — AMLODIPINE BESYLATE 5 MG PO TABS: 5.0000 mg | ORAL_TABLET | Freq: Every day | ORAL | Status: DC

## 2014-10-25 ENCOUNTER — Other Ambulatory Visit: Payer: Self-pay | Admitting: Family Medicine

## 2014-11-03 DIAGNOSIS — K2100 Gastro-esophageal reflux disease with esophagitis, without bleeding: Secondary | ICD-10-CM | POA: Insufficient documentation

## 2014-11-03 DIAGNOSIS — R002 Palpitations: Secondary | ICD-10-CM | POA: Insufficient documentation

## 2014-11-03 DIAGNOSIS — G4733 Obstructive sleep apnea (adult) (pediatric): Secondary | ICD-10-CM | POA: Insufficient documentation

## 2014-11-03 DIAGNOSIS — N3000 Acute cystitis without hematuria: Secondary | ICD-10-CM | POA: Insufficient documentation

## 2014-11-03 DIAGNOSIS — J302 Other seasonal allergic rhinitis: Secondary | ICD-10-CM | POA: Insufficient documentation

## 2014-11-03 DIAGNOSIS — H269 Unspecified cataract: Secondary | ICD-10-CM | POA: Insufficient documentation

## 2014-11-03 DIAGNOSIS — Z9849 Cataract extraction status, unspecified eye: Secondary | ICD-10-CM | POA: Insufficient documentation

## 2014-11-03 DIAGNOSIS — I10 Essential (primary) hypertension: Secondary | ICD-10-CM | POA: Insufficient documentation

## 2014-11-03 DIAGNOSIS — E7800 Pure hypercholesterolemia, unspecified: Secondary | ICD-10-CM | POA: Insufficient documentation

## 2014-11-03 DIAGNOSIS — E785 Hyperlipidemia, unspecified: Secondary | ICD-10-CM | POA: Insufficient documentation

## 2014-11-03 DIAGNOSIS — F419 Anxiety disorder, unspecified: Secondary | ICD-10-CM | POA: Insufficient documentation

## 2014-11-03 DIAGNOSIS — H60549 Acute eczematoid otitis externa, unspecified ear: Secondary | ICD-10-CM | POA: Insufficient documentation

## 2014-11-03 DIAGNOSIS — F329 Major depressive disorder, single episode, unspecified: Secondary | ICD-10-CM | POA: Insufficient documentation

## 2014-11-03 DIAGNOSIS — D649 Anemia, unspecified: Secondary | ICD-10-CM | POA: Insufficient documentation

## 2014-11-03 DIAGNOSIS — N959 Unspecified menopausal and perimenopausal disorder: Secondary | ICD-10-CM | POA: Insufficient documentation

## 2014-11-03 DIAGNOSIS — K219 Gastro-esophageal reflux disease without esophagitis: Secondary | ICD-10-CM | POA: Insufficient documentation

## 2014-11-03 DIAGNOSIS — J449 Chronic obstructive pulmonary disease, unspecified: Secondary | ICD-10-CM | POA: Insufficient documentation

## 2014-11-03 DIAGNOSIS — R911 Solitary pulmonary nodule: Secondary | ICD-10-CM | POA: Insufficient documentation

## 2014-11-03 DIAGNOSIS — F411 Generalized anxiety disorder: Secondary | ICD-10-CM | POA: Insufficient documentation

## 2014-11-04 ENCOUNTER — Ambulatory Visit (INDEPENDENT_AMBULATORY_CARE_PROVIDER_SITE_OTHER): Payer: Medicare Other | Admitting: Family Medicine

## 2014-11-04 ENCOUNTER — Encounter: Payer: Self-pay | Admitting: Family Medicine

## 2014-11-04 VITALS — BP 140/60 | HR 60 | Temp 97.5°F | Resp 16 | Ht 61.0 in | Wt 155.0 lb

## 2014-11-04 DIAGNOSIS — Z0000 Encounter for general adult medical examination without abnormal findings: Secondary | ICD-10-CM

## 2014-11-04 DIAGNOSIS — Z23 Encounter for immunization: Secondary | ICD-10-CM | POA: Diagnosis not present

## 2014-11-04 MED ORDER — ROSUVASTATIN CALCIUM 5 MG PO TABS: 5.0000 mg | ORAL_TABLET | Freq: Every day | ORAL | Status: DC

## 2014-11-04 NOTE — Progress Notes (Signed)
Patient: Kathy Pacheco, Female    DOB: [birth year], 81 y.o.   MRN: [id number] Visit Date: 11/04/2014  Today's Provider: [name], MD   Chief Complaint  Patient presents with  . Medicare Wellness   Subjective:    Annual wellness visit Kathy Pacheco is a 81 y.o. female. She feels well. She reports exercising, walks everyday depends on what patient is doing. She reports she is sleeping well sleeps 8-9 hours.   Mammo 11/24/13 EKG 05/29/13 BMD 03/11/12 Osteopenia Pap 09/10/2007 Normal Colon 06/01/2003 repeat 10 years Td 09/10/2007 Prevnar 08/04/13 Pneumovax 23 05/02/09 -----------------------------------------------------------   Review of Systems  Constitutional: Negative.   HENT: Positive for hearing loss and sinus pressure.   Eyes: Negative.   Respiratory: Positive for cough and wheezing (a little).   Cardiovascular: Negative.   Gastrointestinal: Negative.   Endocrine: Positive for polyuria.  Genitourinary: Negative.   Musculoskeletal: Negative.   Skin: Negative.   Allergic/Immunologic: Positive for environmental allergies.  Neurological: Negative.   Hematological: Negative.   Psychiatric/Behavioral: Negative.     Social History   Social History  . Marital Status: Widowed    Spouse Name: N/A  . Number of Children: N/A  . Years of Education: N/A   Occupational History  . Not on file.   Social History Main Topics  . Smoking status: Never Smoker   . Smokeless tobacco: Never Used  . Alcohol Use: No  . Drug Use: No  . Sexual Activity: Not on file   Other Topics Concern  . Not on file   Social History Narrative    Patient Active Problem List   Diagnosis Date Noted  . Absolute anemia 11/03/2014  . Anxiety 11/03/2014  . Bilateral cataracts 11/03/2014  . COPD, mild 11/03/2014  . Acute cystitis 11/03/2014  . 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 Esophagitis, reflux 11/03/2014  . Anxiety, generalized 11/03/2014  . Acid reflux  11/03/2014  . Hypercholesteremia 11/03/2014  . HLD (hyperlipidemia) 11/03/2014  . BP (high blood pressure) 11/03/2014  . Affective disorder, major 11/03/2014  . Menopausal and perimenopausal disorder 11/03/2014  . Obstructive apnea 11/03/2014  . Awareness of heartbeats 11/03/2014  . Solitary pulmonary nodule 11/03/2014  . Hypercholesterolemia without hypertriglyceridemia 11/03/2014  . Allergic rhinitis, seasonal 11/03/2014  . H/O cataract extraction 11/03/2014  . Apnea, sleep 12/16/2013    Past Surgical History  Procedure Laterality Date  . Cataract extraction Bilateral   . Hernia repair      Her family history includes Allergies in her father; Anxiety disorder in her brother; Arthritis in her brother; COPD in her father; CVA in her brother and father; Cancer in her maternal grandmother; Emphysema in her father; Epilepsy in her mother; Healthy in her brother and brother; Heart disease in her brother and brother; Hypertension in her father and maternal grandmother; Lung cancer in her father; Lung disease in her brother; Osteoporosis in her mother and sister.    Previous Medications   ACETAMINOPHEN (TYLENOL) 500 MG TABLET    Take by mouth.   ALPRAZOLAM (XANAX) 0.5 MG TABLET    Take by mouth.   AMLODIPINE (NORVASC) 5 MG TABLET    Take 1 tablet (5 mg total) by mouth daily.   ASPIRIN 81 MG TABLET    Take by mouth.   BUSPIRONE (BUSPAR) 10 MG TABLET    Take by mouth.   CALCIUM CARBONATE (OS-CAL) 600 MG TABS TABLET    Take by mouth.   CITALOPRAM (CELEXA) 10  MG TABLET    Take 1 tablet (10 mg total) by mouth daily.   FLUTICASONE (FLONASE) 50 MCG/ACT NASAL SPRAY    Place 2 sprays into both nostrils daily.   FLUTICASONE-SALMETEROL (ADVAIR DISKUS) 500-50 MCG/DOSE AEPB    Inhale into the lungs.   LORATADINE (CLARITIN) 10 MG TABLET    Take by mouth.   MOMETASONE (ELOCON) 0.1 % CREAM    MOMETASONE FUROATE, 0.1% (External Cream)  1 (one) application application nightly as needed for 0 days   Quantity: 30;  Refills: 1   Ordered :28-Oct-2013  Miguel Aschoff MD;  Started 04-August-2013 Active Comments: Medication taken as needed. dab on fingertip to external ear nightly prn   OMEPRAZOLE (PRILOSEC) 20 MG CAPSULE    TAKE ONE CAPSULE BY MOUTH EVERY DAY   ROSUVASTATIN (CRESTOR) 5 MG TABLET    Take by mouth.    Patient Care Team: Jerrol Banana., MD as PCP - General (Family Medicine)     Objective:   Vitals: BP 140/60 mmHg  Pulse 60  Temp(Src) 97.5 F (36.4 C) (Oral)  Resp 16  Ht 5\' 1"  (1.549 m)  Wt 155 lb (70.308 kg)  BMI 29.30 kg/m2  Physical Exam  Activities of Daily Living In your present state of health, do you have any difficulty performing the following activities: 11/04/2014  Hearing? Y  Vision? N  Difficulty concentrating or making decisions? N  Walking or climbing stairs? N  Dressing or bathing? N  Doing errands, shopping? N    Fall Risk Assessment Fall Risk  11/04/2014  Falls in the past year? No     Depression Screen PHQ 2/9 Scores 11/04/2014  PHQ - 2 Score 0    Cognitive Testing - 6-CIT  Correct? Score   What year is it? yes 0 0 or 4  What month is it? yes 0 0 or 3  Memorize:    Kathy Pacheco,  44,  [place],      What time is it? (within 1 hour) yes 0 0 or 3  Count backwards from 20 yes 0 0, 2, or 4  Name the months of the year yes 0 0, 2, or 4  Repeat name & address above no 6 0, 2, 4, 6, 8, or 10       TOTAL SCORE  6/28   Interpretation:  Normal  Normal (0-7) Abnormal (8-28)   Audit-C Alcohol Use Screening  Question Answer Points  How often do you have alcoholic drink? never 0  How many drinks do you typically consume in a day? 0 0  How oftey will you drink 6 or more in a total? never 0  Total Score:  0   A score of 3 or more in women, and 4 or more in men indicates increased risk for alcohol abuse, EXCEPT if all of the points are from question 1.     Assessment & Plan:     Annual Wellness Visit  Reviewed  patient's Family Medical History Reviewed and updated list of patient's medical providers Assessment of cognitive impairment was done Assessed patient's functional ability Established a written schedule for health screening [place] Completed and Reviewed  Exercise Activities and Dietary recommendations Goals    None      Immunization History  Administered Date(s) Administered  . Influenza, High Dose Seasonal PF 11/04/2014  . Pneumococcal Conjugate-13 08/04/2013  . Pneumococcal Polysaccharide-23 05/02/2009  . Td 09/10/2007    Health Maintenance  Topic Date Due  . ZOSTAVAX  10/11/1993  . DEXA SCAN  10/12/1998  . INFLUENZA VACCINE  09/07/2014  . TETANUS/TDAP  09/09/2017  . PNA vac Low Risk Adult  Completed      Discussed health benefits of physical activity, and encouraged her to engage in regular exercise appropriate for her age and condition.    ------------------------------------------------------------------------------------------------------------

## 2014-11-23 ENCOUNTER — Other Ambulatory Visit: Payer: Self-pay | Admitting: Family Medicine

## 2014-11-24 ENCOUNTER — Other Ambulatory Visit: Payer: Self-pay | Admitting: Family Medicine

## 2014-12-09 ENCOUNTER — Telehealth: Payer: Self-pay | Admitting: Family Medicine

## 2014-12-09 ENCOUNTER — Other Ambulatory Visit: Payer: Self-pay

## 2014-12-09 MED ORDER — ROSUVASTATIN CALCIUM 5 MG PO TABS: 5.0000 mg | ORAL_TABLET | Freq: Every day | ORAL | Status: DC

## 2014-12-09 NOTE — Telephone Encounter (Signed)
Pt states she is out of the Rx rosuvastatin (CRESTOR) 5 MG tablet and pt is waiting on this to come in from mail order. Pt is asking if she can get samples for 10 days.  FV#[phone]

## 2014-12-10 ENCOUNTER — Other Ambulatory Visit: Payer: Self-pay

## 2014-12-10 MED ORDER — ROSUVASTATIN CALCIUM 5 MG PO TABS: 5.0000 mg | ORAL_TABLET | Freq: Every day | ORAL | Status: DC

## 2014-12-16 DIAGNOSIS — G4733 Obstructive sleep apnea (adult) (pediatric): Secondary | ICD-10-CM | POA: Diagnosis not present

## 2014-12-16 DIAGNOSIS — E7800 Pure hypercholesterolemia, unspecified: Secondary | ICD-10-CM | POA: Diagnosis not present

## 2014-12-16 DIAGNOSIS — I10 Essential (primary) hypertension: Secondary | ICD-10-CM | POA: Diagnosis not present

## 2014-12-16 DIAGNOSIS — I495 Sick sinus syndrome: Secondary | ICD-10-CM | POA: Diagnosis not present

## 2015-01-04 ENCOUNTER — Other Ambulatory Visit: Payer: Self-pay | Admitting: Family Medicine

## 2015-01-26 ENCOUNTER — Other Ambulatory Visit: Payer: Self-pay

## 2015-01-26 MED ORDER — BUSPIRONE HCL 10 MG PO TABS: 10.0000 mg | ORAL_TABLET | Freq: Two times a day (BID) | ORAL | Status: DC

## 2015-02-27 IMAGING — CT CT HEAD WITHOUT CONTRAST
1 series · 15 of 29 positions shown, 19 images · non-contrast
Comparison: none

REASON FOR EXAM: headache, elevated BP
COMMENTS:

PROCEDURE:     CT  - CT HEAD WITHOUT CONTRAST  - June 26, 2012  [DATE]
RESULT:     Comparison:  None
TECHNIQUE: Multiple axial images from the foramen magnum to the vertex were
obtained without IV contrast.

[Series 2: soft tissue · axial · 0.41mm/px · z∈[-16,+114]mm · 15 of 29 slices shown, 19 images]
[im 2/29  brain]
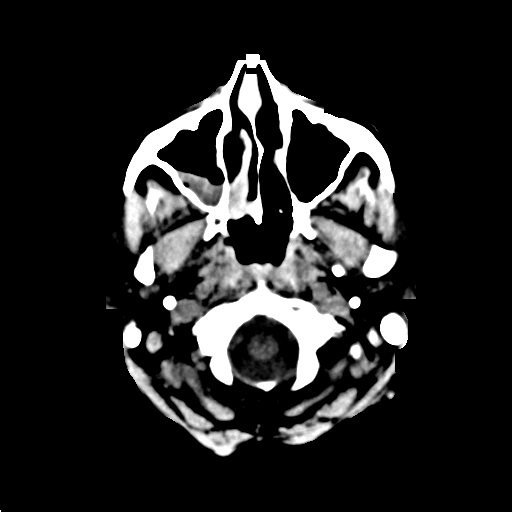
[im 2/29  bone]
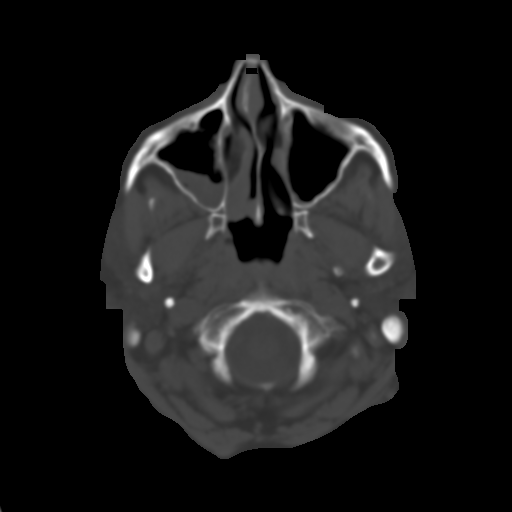
[im 4/29  brain]
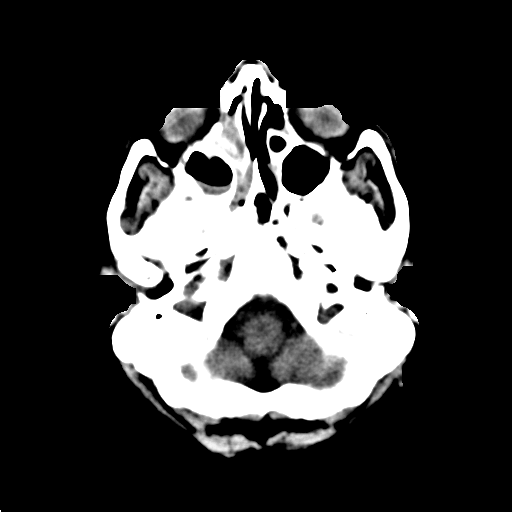
[im 6/29  brain]
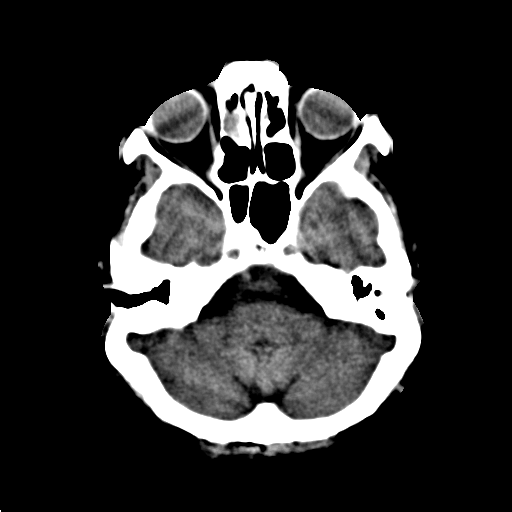
[im 8/29  brain]
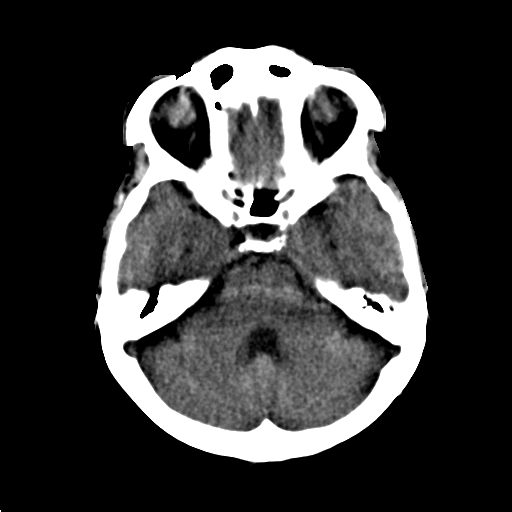
[im 10/29  brain]
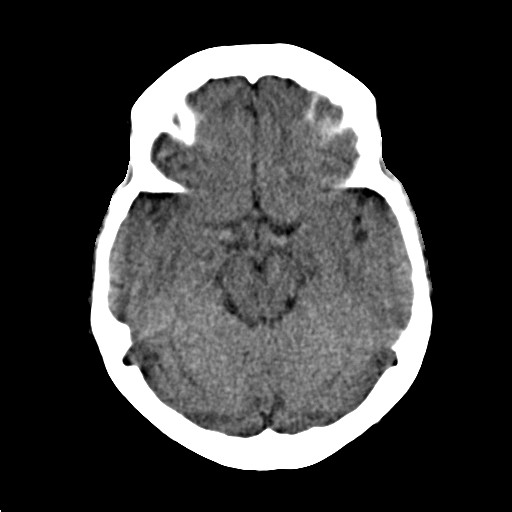
[im 10/29  bone]
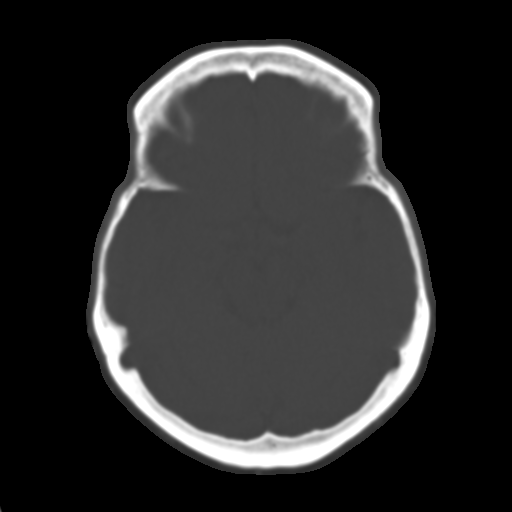
[im 11/29  brain]
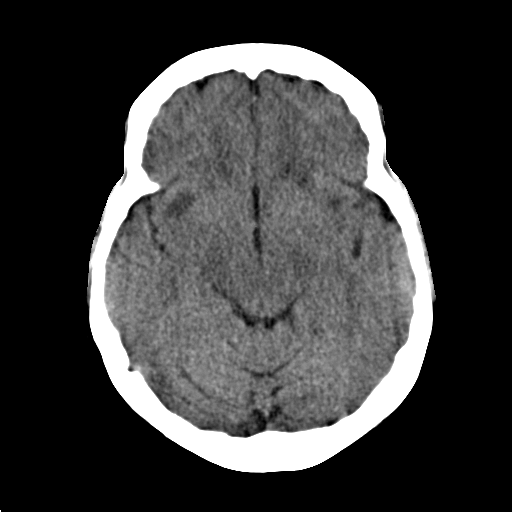
[im 13/29  brain]
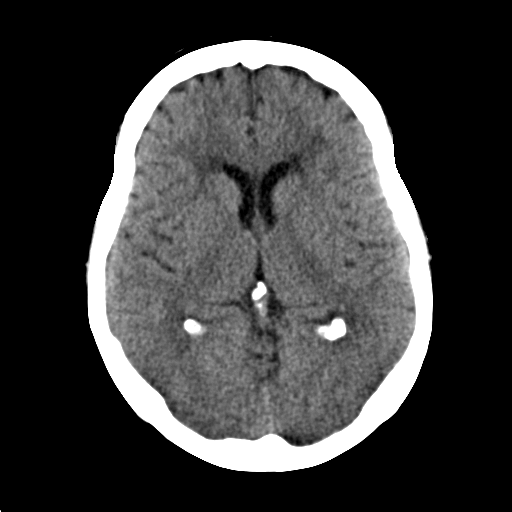
[im 15/29  brain]
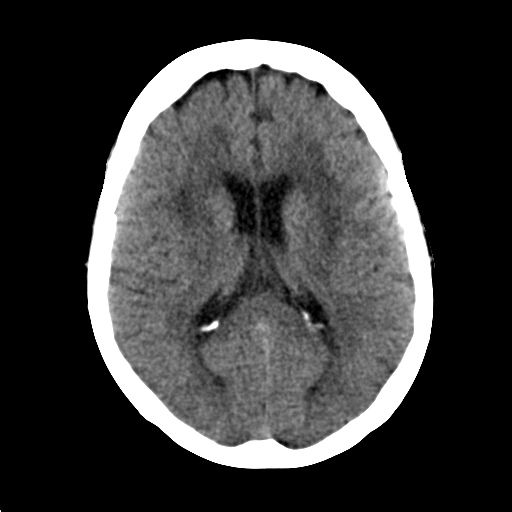
[im 17/29  brain]
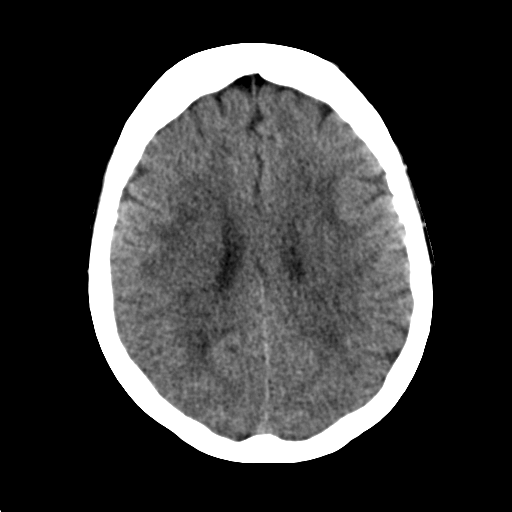
[im 17/29  bone]
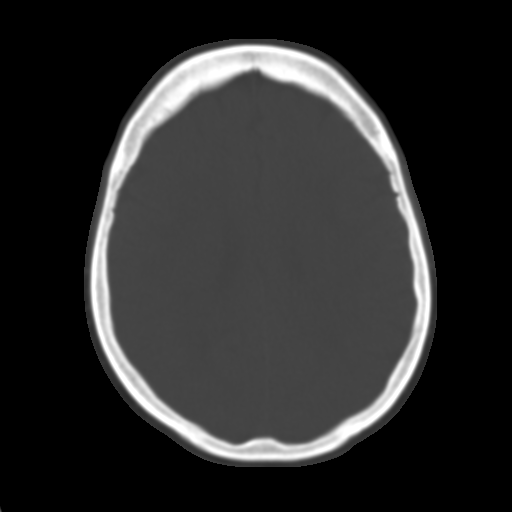
[im 19/29  brain]
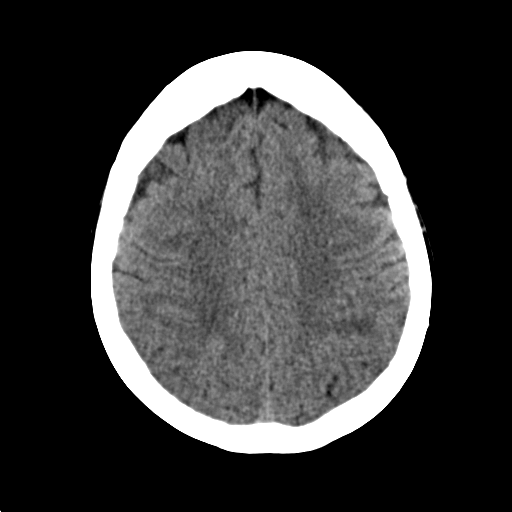
[im 20/29  brain]
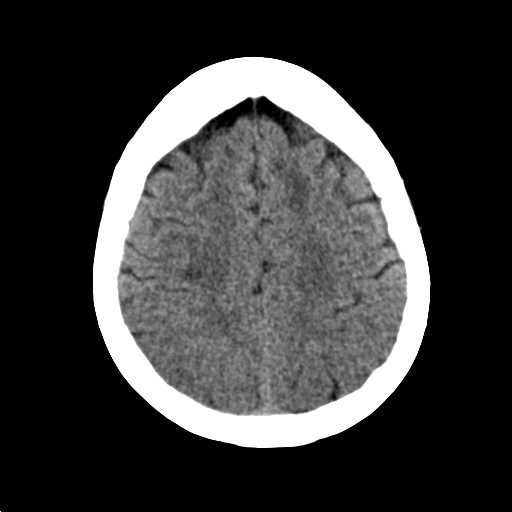
[im 22/29  brain]
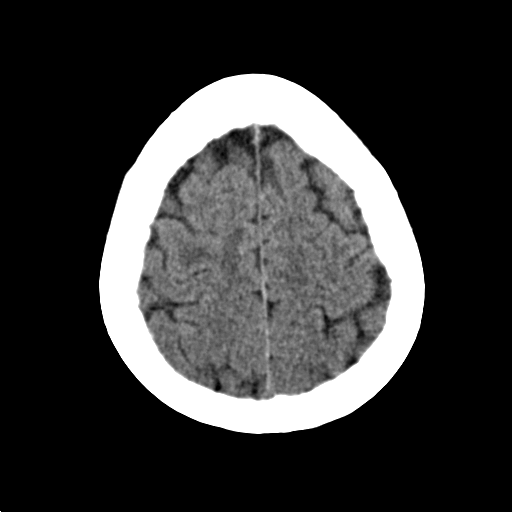
[im 24/29  brain]
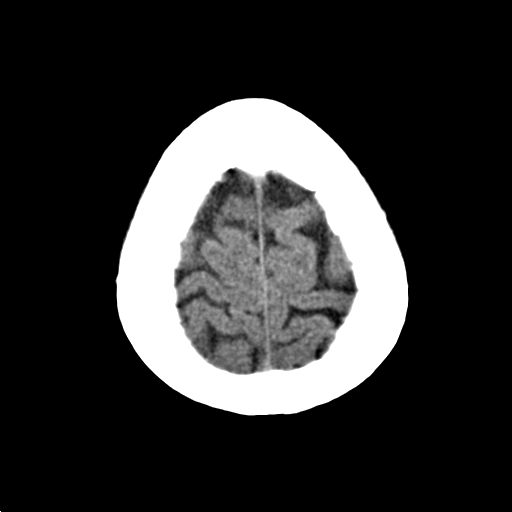
[im 24/29  bone]
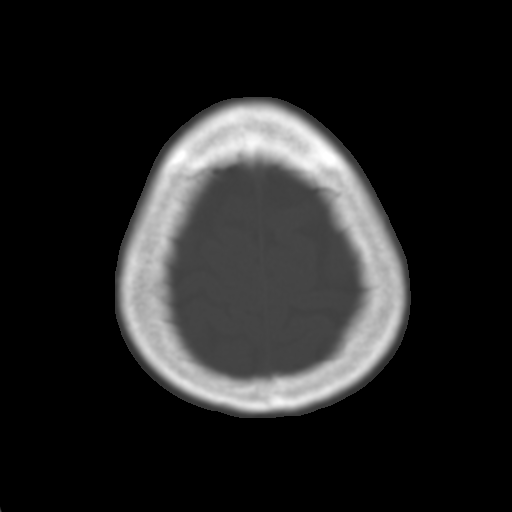
[im 26/29  brain]
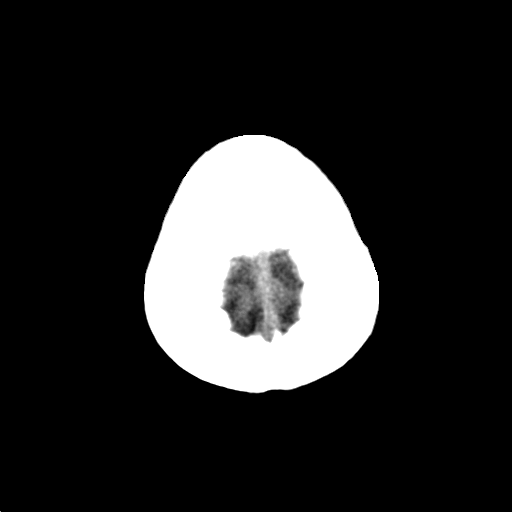
[im 28/29  brain]
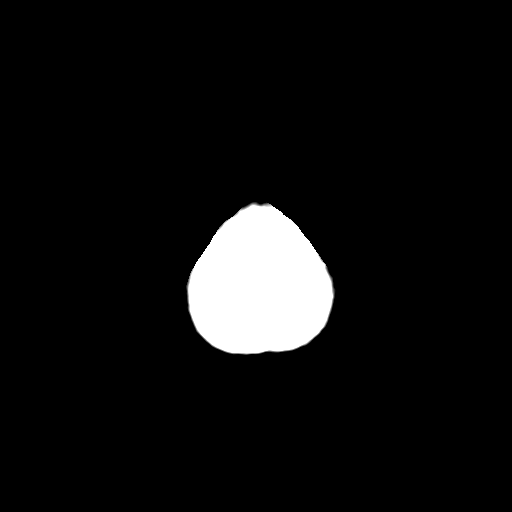

[15 of 29 positions shown; findings below may reference images not displayed]

FINDINGS: There is no evidence of mass effect, midline shift, or extra-axial fluid
collections.  There is no evidence of a space-occupying lesion or
intracranial hemorrhage. There is no evidence of a cortical-based area of
acute infarction. There is generalized cerebral atrophy. There is
periventricular white matter low attenuation likely secondary to
microangiopathy.

The ventricles and sulci are appropriate for the patient's age. The basal
cisterns are patent.

Visualized portions of the orbits are unremarkable. There is mild left
maxillary sinus mucosal thickening. There is a small right maxillary sinus
air-fluid level. There is right ethmoid sinus mucosal thickening.

The osseous structures are unremarkable.
IMPRESSION: No acute intracranial process.

Sinus disease as described above.

[REDACTED]

## 2015-05-04 ENCOUNTER — Ambulatory Visit (INDEPENDENT_AMBULATORY_CARE_PROVIDER_SITE_OTHER): Payer: Medicare Other | Admitting: Family Medicine

## 2015-05-04 ENCOUNTER — Encounter: Payer: Self-pay | Admitting: Family Medicine

## 2015-05-04 VITALS — BP 118/82 | HR 72 | Temp 98.2°F | Resp 12 | Wt 158.0 lb

## 2015-05-04 DIAGNOSIS — J302 Other seasonal allergic rhinitis: Secondary | ICD-10-CM

## 2015-05-04 DIAGNOSIS — D649 Anemia, unspecified: Secondary | ICD-10-CM | POA: Diagnosis not present

## 2015-05-04 DIAGNOSIS — E7800 Pure hypercholesterolemia, unspecified: Secondary | ICD-10-CM | POA: Diagnosis not present

## 2015-05-04 DIAGNOSIS — Z1239 Encounter for other screening for malignant neoplasm of breast: Secondary | ICD-10-CM

## 2015-05-04 DIAGNOSIS — Z5320 Procedure and treatment not carried out because of patient's decision for unspecified reasons: Secondary | ICD-10-CM

## 2015-05-04 DIAGNOSIS — I10 Essential (primary) hypertension: Secondary | ICD-10-CM

## 2015-05-04 DIAGNOSIS — Z5329 Procedure and treatment not carried out because of patient's decision for other reasons: Secondary | ICD-10-CM | POA: Diagnosis not present

## 2015-05-04 DIAGNOSIS — K2100 Gastro-esophageal reflux disease with esophagitis, without bleeding: Secondary | ICD-10-CM

## 2015-05-04 DIAGNOSIS — F419 Anxiety disorder, unspecified: Secondary | ICD-10-CM

## 2015-05-04 DIAGNOSIS — Z1231 Encounter for screening mammogram for malignant neoplasm of breast: Secondary | ICD-10-CM

## 2015-05-04 NOTE — Progress Notes (Signed)
Patient ID: Kathy Pacheco, female   DOB: Jun 29, 1931, 82 y.o.   MRN: [id number]    Subjective:  HPI  Patient is here for 6 months follow up.  Hypertension: Patient does check her B/P at times and readings have been good-the other day reading was 106/60s. BP Readings from Last 3 Encounters:  05/04/15 118/82  11/04/14 140/60  05/19/14 140/64   Hyperlipidemia: Takes Crestor. No side effects. Lab Results  Component Value Date   CHOL 179 04/28/2014   HDL 49 04/28/2014   LDLCALC 110 04/28/2014   TRIG 101 04/28/2014   GERD: symptoms are stable on Omeprazole daily, depends on what she eats sometimes she has some heartburn.  Anxiety: Takes Buspar and Celexa daily and feels like her symptoms are controlled on this regimen. She takes Xanax 1/2 tablet at bedtime.  Prior to Admission medications   Medication Sig Start Date End Date Taking? Authorizing Provider  acetaminophen (TYLENOL) 500 MG tablet Take by mouth.   Yes Historical Provider, MD  ALPRAZolam Duanne Moron) 0.5 MG tablet TAKE 1 TABLET BY MOUTH AT BEDTIME 01/05/15  Yes Richard Maceo Pro., MD  amLODipine (NORVASC) 5 MG tablet Take 1 tablet (5 mg total) by mouth daily. 10/01/14  Yes Richard Maceo Pro., MD  aspirin 81 MG tablet Take by mouth. 01/27/10  Yes Historical Provider, MD  busPIRone (BUSPAR) 10 MG tablet Take 1 tablet (10 mg total) by mouth 2 (two) times daily. 01/26/15  Yes Richard Maceo Pro., MD  calcium carbonate (OS-CAL) 600 MG TABS tablet Take by mouth. 01/27/10  Yes Historical Provider, MD  citalopram (CELEXA) 10 MG tablet Take 1 tablet (10 mg total) by mouth daily. 10/01/14  Yes Richard Maceo Pro., MD  fluticasone [facility]) 50 MCG/ACT nasal spray Place 2 sprays into both nostrils daily. 10/01/14  Yes Richard Maceo Pro., MD  Fluticasone-Salmeterol (ADVAIR DISKUS) 500-50 MCG/DOSE AEPB Inhale into the lungs. 04/27/14  Yes Historical Provider, MD  loratadine (CLARITIN) 10 MG tablet Take by mouth. 04/27/14  Yes Historical  Provider, MD  mometasone (ELOCON) 0.1 % cream MOMETASONE FUROATE, 0.1% (External Cream)  1 (one) application application nightly as needed for 0 days  Quantity: 30;  Refills: 1   Ordered :28-Oct-2013  Miguel Aschoff MD;  Started 04-August-2013 Active Comments: Medication taken as needed. dab on fingertip to external ear nightly prn 08/04/13  Yes Historical Provider, MD  omeprazole (PRILOSEC) 20 MG capsule TAKE ONE CAPSULE BY MOUTH EVERY DAY 10/26/14  Yes Richard Maceo Pro., MD  rosuvastatin (CRESTOR) 5 MG tablet Take 1 tablet (5 mg total) by mouth daily. 12/10/14  Yes Richard Maceo Pro., MD    Patient Active Problem List   Diagnosis Date Noted  . Absolute anemia 11/03/2014  . Anxiety 11/03/2014  . Bilateral cataracts 11/03/2014  . COPD, mild ([place]) 11/03/2014  . Acute cystitis 11/03/2014  . 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 Esophagitis, reflux 11/03/2014  . Anxiety, generalized 11/03/2014  . Acid reflux 11/03/2014  . Hypercholesteremia 11/03/2014  . HLD (hyperlipidemia) 11/03/2014  . BP (high blood pressure) 11/03/2014  . Affective disorder, major ([place]) 11/03/2014  . Menopausal and perimenopausal disorder 11/03/2014  . Obstructive apnea 11/03/2014  . Awareness of heartbeats 11/03/2014  . Solitary pulmonary nodule 11/03/2014  . Hypercholesterolemia without hypertriglyceridemia 11/03/2014  . Allergic rhinitis, seasonal 11/03/2014  . H/O cataract extraction 11/03/2014  . Apnea, sleep 12/16/2013    No past medical history on file.  Social History   Social History  .  Marital Status: Widowed    Spouse Name: N/A  . Number of Children: N/A  . Years of Education: N/A   Occupational History  . Not on file.   Social History Main Topics  . Smoking status: Never Smoker   . Smokeless tobacco: Never Used  . Alcohol Use: No  . Drug Use: No  . Sexual Activity: Not on file   Other Topics Concern  . Not on file   Social History Narrative    Allergies  Allergen  Reactions  . Ciprofloxacin     Review of Systems  Constitutional: Negative.   HENT: Negative.   Eyes: Negative.   Respiratory: Negative.   Cardiovascular: Negative.   Gastrointestinal: Negative.   Musculoskeletal: Negative.   Skin: Negative.   Endo/Heme/Allergies: Negative.   Psychiatric/Behavioral: Negative.     Immunization History  Administered Date(s) Administered  . Influenza, High Dose Seasonal PF 11/04/2014  . Pneumococcal Conjugate-13 08/04/2013  . Pneumococcal Polysaccharide-23 05/02/2009  . Td 09/10/2007   Objective:  BP 118/82 mmHg  Pulse 72  Temp(Src) 98.2 F (36.8 C)  Resp 12  Wt 158 lb (71.668 kg)  Physical Exam  Constitutional: She is oriented to person, place, and time and well-developed, well-nourished, and in no distress.  HENT:  Head: Normocephalic and atraumatic.  Right Ear: External ear normal.  Left Ear: External ear normal.  Nose: Nose normal.  Eyes: Conjunctivae are normal. Pupils are equal, round, and reactive to light.  Neck: Normal range of motion. Neck supple.  Cardiovascular: Normal rate and regular rhythm.   Murmur heard.  Systolic (upper border-early soft murmur) murmur is present with a grade of 2/6  Pulmonary/Chest: Effort normal and breath sounds normal. No respiratory distress. She has no wheezes.  Abdominal: Soft.  Musculoskeletal: She exhibits no edema or tenderness.  Neurological: She is alert and oriented to person, place, and time. Gait normal.  Skin: Skin is warm and dry.  Psychiatric: Mood, memory, affect and judgment normal.    Lab Results  Component Value Date   WBC 6.5 04/28/2014   HGB 12.6 04/28/2014   HCT 38 04/28/2014   PLT 292 04/28/2014   GLUCOSE 95 07/01/2012   CHOL 179 04/28/2014   TRIG 101 04/28/2014   HDL 49 04/28/2014   LDLCALC 110 04/28/2014   TSH 1.52 04/28/2014    CMP     Component Value Date/Time   NA 143 04/28/2014   NA 140 07/01/2012 0236   K 4.4 04/28/2014   K 3.8 07/01/2012 0236    CL 104 07/01/2012 0236   CO2 27 07/01/2012 0236   GLUCOSE 95 07/01/2012 0236   BUN 15 04/28/2014   BUN 17 07/01/2012 0236   CREATININE 0.9 04/28/2014   CREATININE 0.97 07/01/2012 0236   CALCIUM 9.4 07/01/2012 0236   PROT 7.4 07/01/2012 0236   ALBUMIN 3.5 07/01/2012 0236   AST 14 04/28/2014   AST 24 07/01/2012 0236   ALT 11 04/28/2014   ALT 24 07/01/2012 0236   ALKPHOS 72 04/28/2014   ALKPHOS 89 07/01/2012 0236   BILITOT 0.4 07/01/2012 0236   GFRNONAA 56* 07/01/2012 0236   GFRAA >60 07/01/2012 0236    Assessment and Plan :  1. Essential hypertension Stable. Continue current medication.  2. Allergic rhinitis, seasonal Stable.  3. Esophagitis, reflux Stable on Omeprazole.  4. Hypercholesteremia Will check labs today.  5. Anxiety Patient wants to try and stop Xanax. Discussed risks of taking this medication daily. Will follow.  6. Anemia, unspecified anemia  type Check labs.  Patient was seen and examined by Dr. Eulas Post and note was scribed by Theressa Millard, RMA.   I have done the exam and reviewed the above chart and it is accurate to the best of my knowledge.  Miguel Aschoff MD [facility] 05/04/2015 8:17 AM

## 2015-05-06 DIAGNOSIS — E7800 Pure hypercholesterolemia, unspecified: Secondary | ICD-10-CM | POA: Diagnosis not present

## 2015-05-06 DIAGNOSIS — I10 Essential (primary) hypertension: Secondary | ICD-10-CM | POA: Diagnosis not present

## 2015-05-06 DIAGNOSIS — D649 Anemia, unspecified: Secondary | ICD-10-CM | POA: Diagnosis not present

## 2015-05-06 DIAGNOSIS — F419 Anxiety disorder, unspecified: Secondary | ICD-10-CM | POA: Diagnosis not present

## 2015-05-07 LAB — CBC WITH DIFFERENTIAL/PLATELET
Basophils Absolute: 0 10*3/uL (ref 0.0–0.2)
Basos: 1 %
EOS (ABSOLUTE): 0.5 10*3/uL — ABNORMAL HIGH (ref 0.0–0.4)
EOS: 9 %
HEMATOCRIT: 40.4 % (ref 34.0–46.6)
Hemoglobin: 13.2 g/dL (ref 11.1–15.9)
Immature Grans (Abs): 0 10*3/uL (ref 0.0–0.1)
Immature Granulocytes: 0 %
LYMPHS ABS: 1.3 10*3/uL (ref 0.7–3.1)
Lymphs: 25 %
MCH: 28.9 pg (ref 26.6–33.0)
MCHC: 32.7 g/dL (ref 31.5–35.7)
MCV: 88 fL (ref 79–97)
MONOCYTES: 14 %
MONOS ABS: 0.7 10*3/uL (ref 0.1–0.9)
NEUTROS ABS: 2.7 10*3/uL (ref 1.4–7.0)
Neutrophils: 51 %
PLATELETS: 286 10*3/uL (ref 150–379)
RBC: 4.57 x10E6/uL (ref 3.77–5.28)
RDW: 14.9 % (ref 12.3–15.4)
WBC: 5.2 10*3/uL (ref 3.4–10.8)

## 2015-05-07 LAB — TSH: TSH: 1.2 u[IU]/mL (ref 0.450–4.500)

## 2015-05-07 LAB — COMPREHENSIVE METABOLIC PANEL
A/G RATIO: 1.4 (ref 1.2–2.2)
ALT: 11 IU/L (ref 0–32)
AST: 17 IU/L (ref 0–40)
Albumin: 3.9 g/dL (ref 3.5–4.7)
Alkaline Phosphatase: 78 IU/L (ref 39–117)
BILIRUBIN TOTAL: 0.4 mg/dL (ref 0.0–1.2)
BUN / CREAT RATIO: 15 (ref 11–26)
BUN: 16 mg/dL (ref 8–27)
CHLORIDE: 100 mmol/L (ref 96–106)
CO2: 22 mmol/L (ref 18–29)
Calcium: 9.5 mg/dL (ref 8.7–10.3)
Creatinine, Ser: 1.07 mg/dL — ABNORMAL HIGH (ref 0.57–1.00)
GFR calc non Af Amer: 49 mL/min/{1.73_m2} — ABNORMAL LOW (ref >59)
GFR, EST AFRICAN AMERICAN: 56 mL/min/{1.73_m2} — AB (ref >59)
GLOBULIN, TOTAL: 2.7 g/dL (ref 1.5–4.5)
Glucose: 97 mg/dL (ref 65–99)
POTASSIUM: 4.5 mmol/L (ref 3.5–5.2)
SODIUM: 141 mmol/L (ref 134–144)
TOTAL PROTEIN: 6.6 g/dL (ref 6.0–8.5)

## 2015-05-07 LAB — LIPID PANEL WITH LDL/HDL RATIO
Cholesterol, Total: 164 mg/dL (ref 100–199)
HDL: 43 mg/dL (ref >39)
LDL Calculated: 100 mg/dL — ABNORMAL HIGH (ref 0–99)
LDL/HDL RATIO: 2.3 ratio (ref 0.0–3.2)
Triglycerides: 105 mg/dL (ref 0–149)
VLDL Cholesterol Cal: 21 mg/dL (ref 5–40)

## 2015-05-15 ENCOUNTER — Other Ambulatory Visit: Payer: Self-pay | Admitting: Family Medicine

## 2015-05-17 DIAGNOSIS — H26493 Other secondary cataract, bilateral: Secondary | ICD-10-CM | POA: Diagnosis not present

## 2015-06-16 DIAGNOSIS — Z961 Presence of intraocular lens: Secondary | ICD-10-CM | POA: Diagnosis not present

## 2015-06-22 DIAGNOSIS — F419 Anxiety disorder, unspecified: Secondary | ICD-10-CM | POA: Diagnosis not present

## 2015-06-22 DIAGNOSIS — E7800 Pure hypercholesterolemia, unspecified: Secondary | ICD-10-CM | POA: Diagnosis not present

## 2015-06-22 DIAGNOSIS — I10 Essential (primary) hypertension: Secondary | ICD-10-CM | POA: Diagnosis not present

## 2015-06-22 DIAGNOSIS — G4733 Obstructive sleep apnea (adult) (pediatric): Secondary | ICD-10-CM | POA: Diagnosis not present

## 2015-07-19 ENCOUNTER — Telehealth: Payer: Self-pay

## 2015-07-19 NOTE — Telephone Encounter (Signed)
Refill request for Omeprazole from [facility] [address], for 90 day supply-aa

## 2015-07-20 MED ORDER — OMEPRAZOLE 20 MG PO CPDR: 20.0000 mg | DELAYED_RELEASE_CAPSULE | Freq: Every day | ORAL | Status: DC

## 2015-07-20 NOTE — Telephone Encounter (Signed)
Done-aa 

## 2015-07-20 NOTE — Telephone Encounter (Signed)
ok 

## 2015-08-16 ENCOUNTER — Other Ambulatory Visit: Payer: Self-pay

## 2015-08-16 MED ORDER — BUSPIRONE HCL 10 MG PO TABS: 10.0000 mg | ORAL_TABLET | Freq: Two times a day (BID) | ORAL | Status: DC

## 2015-11-04 ENCOUNTER — Encounter: Payer: Self-pay | Admitting: Family Medicine

## 2015-11-04 ENCOUNTER — Ambulatory Visit (INDEPENDENT_AMBULATORY_CARE_PROVIDER_SITE_OTHER): Payer: Medicare Other | Admitting: Family Medicine

## 2015-11-04 VITALS — BP 122/60 | HR 76 | Temp 97.5°F | Ht 61.0 in | Wt 155.0 lb

## 2015-11-04 DIAGNOSIS — E7800 Pure hypercholesterolemia, unspecified: Secondary | ICD-10-CM

## 2015-11-04 DIAGNOSIS — Z1211 Encounter for screening for malignant neoplasm of colon: Secondary | ICD-10-CM

## 2015-11-04 DIAGNOSIS — Z23 Encounter for immunization: Secondary | ICD-10-CM

## 2015-11-04 DIAGNOSIS — R0981 Nasal congestion: Secondary | ICD-10-CM | POA: Diagnosis not present

## 2015-11-04 MED ORDER — ROSUVASTATIN CALCIUM 5 MG PO TABS: 5.0000 mg | ORAL_TABLET | Freq: Every day | ORAL | 3 refills | Status: DC

## 2015-11-04 NOTE — Progress Notes (Signed)
Patient: Kathy Pacheco, Female    DOB: 1931-03-22, 82 y.o.   MRN: [id number] Visit Date: 11/04/2015  Today's Provider: [name], MD   Chief Complaint  Patient presents with  . Annual Exam   Subjective:   Kathy Pacheco is a 82 y.o. female who presents today for her Subsequent Annual Wellness Visit. She feels well. She reports exercising none. She reports she is sleeping well.  11/24/2013 Mammogram 03/12/2003 BMD-Osteopenia 06/01/2003 Colonoscopy-diverticulosis 09/10/2007 Pap-normal  Immunization History  Administered Date(s) Administered  . Influenza, High Dose Seasonal PF 11/04/2014  . Pneumococcal Conjugate-13 08/04/2013  . Pneumococcal Polysaccharide-23 05/02/2009  . Td 09/10/2007     Review of Systems  Constitutional: Negative.   HENT: Positive for congestion.   Eyes: Negative.   Respiratory: Negative.   Cardiovascular: Negative.   Gastrointestinal: Negative.   Endocrine: Negative.   Genitourinary: Negative.   Musculoskeletal: Negative.   Skin: Negative.   Allergic/Immunologic: Negative.   Neurological: Negative.   Hematological: Negative.   Psychiatric/Behavioral: Negative.     Patient Active Problem List   Diagnosis Date Noted  . Absolute anemia 11/03/2014  . Anxiety 11/03/2014  . Bilateral cataracts 11/03/2014  . COPD, mild ([place]) 11/03/2014  . Acute cystitis 11/03/2014  . 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 Esophagitis, reflux 11/03/2014  . Anxiety, generalized 11/03/2014  . Acid reflux 11/03/2014  . Hypercholesteremia 11/03/2014  . HLD (hyperlipidemia) 11/03/2014  . BP (high blood pressure) 11/03/2014  . Affective disorder, major ([place]) 11/03/2014  . Menopausal and perimenopausal disorder 11/03/2014  . Obstructive apnea 11/03/2014  . Awareness of heartbeats 11/03/2014  . Solitary pulmonary nodule 11/03/2014  . Hypercholesterolemia without hypertriglyceridemia 11/03/2014  . Allergic rhinitis, seasonal 11/03/2014  . H/O cataract  extraction 11/03/2014  . Apnea, sleep 12/16/2013    Social History   Social History  . Marital status: Widowed    Spouse name: N/A  . Number of children: N/A  . Years of education: N/A   Occupational History  . Not on file.   Social History Main Topics  . Smoking status: Never Smoker  . Smokeless tobacco: Never Used  . Alcohol use No  . Drug use: No  . Sexual activity: Not on file   Other Topics Concern  . Not on file   Social History Narrative  . No narrative on file    Past Surgical History:  Procedure Laterality Date  . CATARACT EXTRACTION Bilateral   . HERNIA REPAIR      Her family history includes Allergies in her father; Anxiety disorder in her brother; Arthritis in her brother; COPD in her father; CVA in her brother and father; Cancer in her maternal grandmother; Emphysema in her father; Epilepsy in her mother; Healthy in her brother and brother; Heart disease in her brother and brother; Hypertension in her father and maternal grandmother; Lung cancer in her father; Lung disease in her brother; Osteoporosis in her mother and sister.    Outpatient Encounter Prescriptions as of 11/04/2015  Medication Sig Note  . acetaminophen (TYLENOL) 500 MG tablet Take by mouth. 11/04/2014: Received from: [facility]  . amLODipine (NORVASC) 5 MG tablet Take 1 tablet (5 mg total) by mouth daily.   Marland Kitchen aspirin 81 MG tablet Take by mouth. 11/03/2014: Received from: [facility]  . busPIRone (BUSPAR) 10 MG tablet Take 1 tablet (10 mg total) by mouth 2 (two) times daily.   . calcium carbonate (OS-CAL) 600 MG TABS tablet Take by mouth. 11/03/2014: Received  from: [facility]  . citalopram (CELEXA) 10 MG tablet Take 1 tablet (10 mg total) by mouth daily.   . fluticasone (FLONASE) 50 MCG/ACT nasal spray Place 2 sprays into both nostrils daily.   . Fluticasone-Salmeterol (ADVAIR DISKUS) 500-50 MCG/DOSE AEPB Inhale into the lungs. 11/03/2014:  Samples given # 2 Lot [id number] Exp 02/2015 Received from: [facility]  . loratadine (CLARITIN) 10 MG tablet TAKE 1 TABLET BY MOUTH EVERY DAY   . mometasone (ELOCON) 0.1 % cream MOMETASONE FUROATE, 0.1% (External Cream)  1 (one) application application nightly as needed for 0 days  Quantity: 30;  Refills: 1   Ordered :28-Oct-2013  Miguel Aschoff MD;  Started 04-August-2013 Active Comments: Medication taken as needed. dab on fingertip to external ear nightly prn 11/03/2014: Medication taken as needed. dab on fingertip to external ear nightly prn Received from: [facility]  . omeprazole (PRILOSEC) 20 MG capsule Take 1 capsule (20 mg total) by mouth daily.   . rosuvastatin (CRESTOR) 5 MG tablet Take 1 tablet (5 mg total) by mouth daily.   . [DISCONTINUED] ALPRAZolam (XANAX) 0.5 MG tablet TAKE 1 TABLET BY MOUTH AT BEDTIME    No facility-administered encounter medications on file as of 11/04/2015.     Allergies  Allergen Reactions  . Ciprofloxacin     Patient Care Team: Jerrol Banana., MD as PCP - General (Family Medicine)  Objective:   Vitals:  Vitals:   11/04/15 1009  BP: 122/60  Pulse: 76  Temp: 97.5 F (36.4 C)  TempSrc: Oral  Weight: 155 lb (70.3 kg)  Height: 5\' 1"  (1.549 m)    Physical Exam  Constitutional: She is oriented to person, place, and time. She appears well-developed and well-nourished.  HENT:  Head: Normocephalic and atraumatic.  Right Ear: External ear normal.  Left Ear: External ear normal.  Nose: Nose normal.  Mouth/Throat: Oropharynx is clear and moist.  Eyes: Conjunctivae and EOM are normal. Pupils are equal, round, and reactive to light.  Neck: Normal range of motion. Neck supple.  Cardiovascular: Normal rate, regular rhythm, normal heart sounds and intact distal pulses.   Pulmonary/Chest: Effort normal and breath sounds normal.  Abdominal: Soft. Bowel sounds are normal.  Musculoskeletal: Normal range of  motion.  Neurological: She is alert and oriented to person, place, and time.  Skin: Skin is warm and dry.  Psychiatric: She has a normal mood and affect. Her behavior is normal. Judgment and thought content normal.    Activities of Daily Living In your present state of health, do you have any difficulty performing the following activities: 11/04/2015 05/04/2015  Hearing? Y N  Vision? N N  Difficulty concentrating or making decisions? N N  Walking or climbing stairs? N N  Dressing or bathing? N N  Doing errands, shopping? N N  Some recent data might be hidden    Fall Risk Assessment Fall Risk  11/04/2015 05/04/2015 11/04/2014  Falls in the past year? Yes No No     Depression Screen PHQ 2/9 Scores 11/04/2015 05/04/2015 11/04/2014  PHQ - 2 Score 0 1 0    Cognitive Testing - 6-CIT    Year: 0 4 points  Month: 0 3 points  Memorize "[name], [address], [place]"  Time (within 1 hour:) 0 3 points  Count backwards from 20: 0 2 4 points  Name months of year: 0 2 4 points  Repeat Address: 0 2 4 6 8 10  points   Total Score: 8/28  Interpretation : Normal (0-7) Abnormal (8-28)    Assessment & Plan:     Annual Wellness Visit  Reviewed patient's Family Medical History Reviewed and updated list of patient's medical providers Assessment of cognitive impairment was done Assessed patient's functional ability Established a written schedule for health screening [place] Completed and Reviewed  Exercise Activities and Dietary recommendations Goals    None      Immunization History  Administered Date(s) Administered  . Influenza, High Dose Seasonal PF 11/04/2014  . Pneumococcal Conjugate-13 08/04/2013  . Pneumococcal Polysaccharide-23 05/02/2009  . Td 09/10/2007    Health Maintenance  Topic Date Due  . ZOSTAVAX  10/11/1993  . INFLUENZA VACCINE  09/07/2015  . TETANUS/TDAP  09/09/2017  . DEXA SCAN  Completed  . PNA vac Low Risk Adult  Completed      Cologard for colon screening in this 82 year old. Discussed health benefits of physical activity, and encouraged her to engage in regular exercise appropriate for her age and condition.       HPI, Exam and A&P Transcribed under the direction and in the presence of Miguel Aschoff, Brooke Bonito., MD. Electronically Signed: Althea Charon, RMA  I have done the exam and reviewed the chart and it is accurate to the best of my knowledge. Miguel Aschoff M.D. [facility]

## 2015-11-18 ENCOUNTER — Other Ambulatory Visit: Payer: Self-pay | Admitting: Family Medicine

## 2015-11-18 DIAGNOSIS — J339 Nasal polyp, unspecified: Secondary | ICD-10-CM | POA: Diagnosis not present

## 2015-11-18 DIAGNOSIS — R0981 Nasal congestion: Secondary | ICD-10-CM | POA: Diagnosis not present

## 2015-11-18 DIAGNOSIS — J309 Allergic rhinitis, unspecified: Secondary | ICD-10-CM | POA: Diagnosis not present

## 2015-11-23 ENCOUNTER — Ambulatory Visit
Admission: RE | Admit: 2015-11-23 | Discharge: 2015-11-23 | Disposition: A | Discharge status: Home / Self Care | Payer: Medicare Other | Source: Ambulatory Visit | Attending: Family Medicine | Admitting: Family Medicine

## 2015-11-23 DIAGNOSIS — Z1231 Encounter for screening mammogram for malignant neoplasm of breast: Secondary | ICD-10-CM | POA: Diagnosis not present

## 2015-11-23 DIAGNOSIS — Z1239 Encounter for other screening for malignant neoplasm of breast: Secondary | ICD-10-CM

## 2015-12-02 DIAGNOSIS — R0981 Nasal congestion: Secondary | ICD-10-CM | POA: Diagnosis not present

## 2015-12-02 DIAGNOSIS — J339 Nasal polyp, unspecified: Secondary | ICD-10-CM | POA: Diagnosis not present

## 2015-12-06 ENCOUNTER — Other Ambulatory Visit: Payer: Self-pay | Admitting: Family Medicine

## 2015-12-24 DIAGNOSIS — I10 Essential (primary) hypertension: Secondary | ICD-10-CM | POA: Diagnosis not present

## 2015-12-24 DIAGNOSIS — G4733 Obstructive sleep apnea (adult) (pediatric): Secondary | ICD-10-CM | POA: Diagnosis not present

## 2015-12-24 DIAGNOSIS — E7800 Pure hypercholesterolemia, unspecified: Secondary | ICD-10-CM | POA: Diagnosis not present

## 2015-12-29 DIAGNOSIS — J209 Acute bronchitis, unspecified: Secondary | ICD-10-CM | POA: Diagnosis not present

## 2016-01-03 ENCOUNTER — Other Ambulatory Visit: Payer: Self-pay | Admitting: Family Medicine

## 2016-01-03 DIAGNOSIS — E7800 Pure hypercholesterolemia, unspecified: Secondary | ICD-10-CM

## 2016-01-03 NOTE — Telephone Encounter (Signed)
Ok to do?-aa 

## 2016-01-03 NOTE — Telephone Encounter (Signed)
Pt called in her Crestor to [facility]  They told her it would not be in for two weeks.  She will be out in a few days.  She would like 30 days called in to [place]  Thank sTeri

## 2016-01-04 MED ORDER — ROSUVASTATIN CALCIUM 5 MG PO TABS: 5.0000 mg | ORAL_TABLET | Freq: Every day | ORAL | 0 refills | Status: DC

## 2016-01-04 NOTE — Telephone Encounter (Signed)
Done-aa 

## 2016-01-04 NOTE — Telephone Encounter (Signed)
ok 

## 2016-01-13 DIAGNOSIS — J309 Allergic rhinitis, unspecified: Secondary | ICD-10-CM | POA: Diagnosis not present

## 2016-01-13 DIAGNOSIS — J339 Nasal polyp, unspecified: Secondary | ICD-10-CM | POA: Diagnosis not present

## 2016-01-31 ENCOUNTER — Other Ambulatory Visit: Payer: Self-pay | Admitting: Family Medicine

## 2016-01-31 DIAGNOSIS — E7800 Pure hypercholesterolemia, unspecified: Secondary | ICD-10-CM

## 2016-02-22 DIAGNOSIS — J339 Nasal polyp, unspecified: Secondary | ICD-10-CM | POA: Diagnosis not present

## 2016-02-22 DIAGNOSIS — J3489 Other specified disorders of nose and nasal sinuses: Secondary | ICD-10-CM | POA: Diagnosis not present

## 2016-03-01 DIAGNOSIS — J3489 Other specified disorders of nose and nasal sinuses: Secondary | ICD-10-CM | POA: Diagnosis not present

## 2016-03-01 DIAGNOSIS — J324 Chronic pansinusitis: Secondary | ICD-10-CM | POA: Diagnosis not present

## 2016-03-01 DIAGNOSIS — J339 Nasal polyp, unspecified: Secondary | ICD-10-CM | POA: Diagnosis not present

## 2016-03-06 ENCOUNTER — Encounter: Payer: Self-pay | Admitting: *Deleted

## 2016-03-09 NOTE — Discharge Instructions (Signed)
[facility] °[facility] °ENDOSCOPIC SINUS SURGERY °[facility] ° °What is Functional Endoscopic Sinus Surgery? ° The Surgery involves making the natural openings of the sinuses larger by removing the bony partitions that separate the sinuses from the nasal cavity.  The natural sinus lining is preserved as much as possible to allow the sinuses to resume normal function after the surgery.  In some patients nasal polyps (excessively swollen lining of the sinuses) may be removed to relieve obstruction of the sinus openings.  The surgery is performed through the nose using lighted scopes, which eliminates the need for incisions on the face.  A septoplasty is a different procedure which is sometimes performed with sinus surgery.  It involves straightening the boy partition that separates the two sides of your nose.  A crooked or deviated septum may need repair if is obstructing the sinuses or nasal airflow.  Turbinate reduction is also often performed during sinus surgery.  The turbinates are bony proturberances from the side walls of the nose which swell and can obstruct the nose in patients with sinus and allergy problems.  Their size can be surgically reduced to help relieve nasal obstruction. ° °What Can Sinus Surgery Do For Me? ° Sinus surgery can reduce the frequency of sinus infections requiring antibiotic treatment.  This can provide improvement in nasal congestion, post-nasal drainage, facial pressure and nasal obstruction.  Surgery will NOT prevent you from ever having an infection again, so it usually only for patients who get infections 4 or more times yearly requiring antibiotics, or for infections that do not clear with antibiotics.  It will not cure nasal allergies, so patients with allergies may still require medication to treat their allergies after surgery. Surgery may improve headaches related to sinusitis, however, some people will continue to  require medication to control sinus headaches related to allergies.  Surgery will do nothing for other forms of headache (migraine, tension or cluster). ° °What Are the Risks of Endoscopic Sinus Surgery? ° Current techniques allow surgery to be performed safely with little risk, however, there are rare complications that patients should be aware of.  Because the sinuses are located around the eyes, there is risk of eye injury, including blindness, though again, this would be quite rare. This is usually a result of bleeding behind the eye during surgery, which puts the vision oat risk, though there are treatments to protect the vision and prevent permanent disrupted by surgery causing a leak of the spinal fluid that surrounds the brain.  More serious complications would include bleeding inside the brain cavity or damage to the brain.  Again, all of these complications are uncommon, and spinal fluid leaks can be safely managed surgically if they occur.  The most common complication of sinus surgery is bleeding from the nose, which may require packing or cauterization of the nose.  Continued sinus have polyps may experience recurrence of the polyps requiring revision surgery.  Alterations of sense of smell or injury to the tear ducts are also rare complications.  ° °What is the Surgery Like, and what is the Recovery? ° The Surgery usually takes a couple of hours to perform, and is usually performed under a general anesthetic (completely asleep).  Patients are usually discharged home after a couple of hours.  Sometimes during surgery it is necessary to pack the nose to control bleeding, and the packing is left in place for 24 - 48 hours, and removed by your surgeon.    If a septoplasty was performed during the procedure, there is often a splint placed which must be removed after 5-7 days.   °Discomfort: Pain is usually mild to moderate, and can be controlled by prescription pain medication or acetaminophen (Tylenol).   Aspirin, Ibuprofen (Advil, Motrin), or Naprosyn (Aleve) should be avoided, as they can cause increased bleeding.  Most patients feel sinus pressure like they have a bad head cold for several days.  Sleeping with your head elevated can help reduce swelling and facial pressure, as can ice packs over the face.  A humidifier may be helpful to keep the mucous and blood from drying in the nose.  ° °Diet: There are no specific diet restrictions, however, you should generally start with clear liquids and a light diet of bland foods because the anesthetic can cause some nausea.  Advance your diet depending on how your stomach feels.  Taking your pain medication with food will often help reduce stomach upset which pain medications can cause. ° °Nasal Saline Irrigation: It is important to remove blood clots and dried mucous from the nose as it is healing.  This is done by having you irrigate the nose at least 3 - 4 times daily with a salt water solution.  We recommend using NeilMed Sinus Rinse (available at the drug store).  Fill the squeeze bottle with the solution, bend over a sink, and insert the tip of the squeeze bottle into the nose ½ of an inch.  Point the tip of the squeeze bottle towards the inside corner of the eye on the same side your irrigating.  Squeeze the bottle and gently irrigate the nose.  If you bend forward as you do this, most of the fluid will flow back out of the nose, instead of down your throat.   The solution should be warm, near body temperature, when you irrigate.   Each time you irrigate, you should use a full squeeze bottle.  ° °Note that if you are instructed to use Nasal Steroid Sprays at any time after your surgery, irrigate with saline BEFORE using the steroid spray, so you do not wash it all out of the nose. °Another product, Nasal Saline Gel (such as AYR Nasal Saline Gel) can be applied in each nostril 3 - 4 times daily to moisture the nose and reduce scabbing or crusting. ° °Bleeding:   Bloody drainage from the nose can be expected for several days, and patients are instructed to irrigate their nose frequently with salt water to help remove mucous and blood clots.  The drainage may be dark red or brown, though some fresh blood may be seen intermittently, especially after irrigation.  Do not blow you nose, as bleeding may occur. If you must sneeze, keep your mouth open to allow air to escape through your mouth. ° °If heavy bleeding occurs: Irrigate the nose with saline to rinse out clots, then spray the nose 3 - 4 times with Afrin Nasal Decongestant Spray.  The spray will constrict the blood vessels to slow bleeding.  Pinch the lower half of your nose shut to apply pressure, and lay down with your head elevated.  Ice packs over the nose may help as well. If bleeding persists despite these measures, you should notify your doctor.  Do not use the Afrin routinely to control nasal congestion after surgery, as it can result in worsening congestion and may affect healing.  ° °Activity: Return to work varies among patients. Most patients will be out of   work at least 5 - 7 days to recover.  Patient may return to work after they are off of narcotic pain medication, and feeling well enough to perform the functions of their job.  Patients must avoid heavy lifting (over 10 pounds) or strenuous physical for 2 weeks after surgery, so your employer may need to assign you to light duty, or keep you out of work longer if light duty is not possible.  NOTE: you should not drive, operate dangerous machinery, do any mentally demanding tasks or make any important legal or financial decisions while on narcotic pain medication and recovering from the general anesthetic.  °  °Call Your Doctor Immediately if You Have Any of the Following: °1. Bleeding that you cannot control with the above measures °2. Loss of vision, double vision, bulging of the eye or black eyes. °3. Fever over 101 degrees °4. Neck stiffness with severe  headache, fever, nausea and change in mental state. °You are always encourage to call anytime with concerns, however, please call with requests for pain medication refills during office hours. ° °Office Endoscopy: During follow-up visits your doctor will remove any packing or splints that may have been placed and evaluate and clean your sinuses endoscopically.  Topical anesthetic will be used to make this as comfortable as possible, though you may want to take your pain medication prior to the visit.  How often this will need to be done varies from patient to patient.  After complete recovery from the surgery, you may need follow-up endoscopy from time to time, particularly if there is concern of recurrent infection or nasal polyps. ° ° °General Anesthesia, Adult, Care After °These instructions provide you with information about caring for yourself after your procedure. Your health care provider may also give you more specific instructions. Your treatment has been planned according to current medical practices, but problems sometimes occur. Call your health care provider if you have any problems or questions after your procedure. °What can I expect after the procedure? °After the procedure, it is common to have: °· Vomiting. °· A sore throat. °· Mental slowness. °It is common to feel: °· Nauseous. °· Cold or shivery. °· Sleepy. °· Tired. °· Sore or achy, even in parts of your body where you did not have surgery. °Follow these instructions at home: °For at least 24 hours after the procedure: °· Do not: °¨ Participate in activities where you could fall or become injured. °¨ Drive. °¨ Use heavy machinery. °¨ Drink alcohol. °¨ Take sleeping pills or medicines that cause drowsiness. °¨ Make important decisions or sign legal documents. °¨ Take care of children on your own. °· Rest. °Eating and drinking °· If you vomit, drink water, juice, or soup when you can drink without vomiting. °· Drink enough fluid to keep your  urine clear or pale yellow. °· Make sure you have little or no nausea before eating solid foods. °· Follow the diet recommended by your health care provider. °General instructions °· Have a responsible adult stay with you until you are awake and alert. °· Return to your normal activities as told by your health care provider. Ask your health care provider what activities are safe for you. °· Take over-the-counter and prescription medicines only as told by your health care provider. °· If you smoke, do not smoke without supervision. °· Keep all follow-up visits as told by your health care provider. This is important. °Contact a health care provider if: °· You continue to have nausea   or vomiting at home, and medicines are not helpful. °· You cannot drink fluids or start eating again. °· You cannot urinate after 8-12 hours. °· You develop a skin rash. °· You have fever. °· You have increasing redness at the site of your procedure. °Get help right away if: °· You have difficulty breathing. °· You have chest pain. °· You have unexpected bleeding. °· You feel that you are having a life-threatening or urgent problem. °This information is not intended to replace advice given to you by your health care provider. Make sure you discuss any questions you have with your health care provider. °Document Released: 05/01/2000 Document Revised: 06/28/2015 Document Reviewed: 01/07/2015 °Elsevier Interactive Patient Education © 2017 [employer] ° °

## 2016-03-10 ENCOUNTER — Ambulatory Visit
Admission: RE | Admit: 2016-03-10 | Discharge: 2016-03-10 | Disposition: A | Discharge status: Home / Self Care | Payer: Medicare Other | Source: Ambulatory Visit | Attending: Unknown Physician Specialty | Admitting: Unknown Physician Specialty

## 2016-03-10 ENCOUNTER — Ambulatory Visit: Payer: Medicare Other | Admitting: Anesthesiology

## 2016-03-10 ENCOUNTER — Encounter
Admission: RE | Disposition: A | Discharge status: Home / Self Care | Payer: Self-pay | Source: Ambulatory Visit | Attending: Unknown Physician Specialty

## 2016-03-10 ENCOUNTER — Encounter: Payer: Self-pay | Admitting: *Deleted

## 2016-03-10 DIAGNOSIS — I10 Essential (primary) hypertension: Secondary | ICD-10-CM | POA: Insufficient documentation

## 2016-03-10 DIAGNOSIS — Z79899 Other long term (current) drug therapy: Secondary | ICD-10-CM | POA: Diagnosis not present

## 2016-03-10 DIAGNOSIS — J343 Hypertrophy of nasal turbinates: Secondary | ICD-10-CM | POA: Diagnosis not present

## 2016-03-10 DIAGNOSIS — G4730 Sleep apnea, unspecified: Secondary | ICD-10-CM | POA: Insufficient documentation

## 2016-03-10 DIAGNOSIS — K219 Gastro-esophageal reflux disease without esophagitis: Secondary | ICD-10-CM | POA: Diagnosis not present

## 2016-03-10 DIAGNOSIS — J329 Chronic sinusitis, unspecified: Secondary | ICD-10-CM | POA: Diagnosis not present

## 2016-03-10 DIAGNOSIS — J3489 Other specified disorders of nose and nasal sinuses: Secondary | ICD-10-CM | POA: Insufficient documentation

## 2016-03-10 DIAGNOSIS — J322 Chronic ethmoidal sinusitis: Secondary | ICD-10-CM | POA: Diagnosis not present

## 2016-03-10 DIAGNOSIS — J449 Chronic obstructive pulmonary disease, unspecified: Secondary | ICD-10-CM | POA: Diagnosis not present

## 2016-03-10 DIAGNOSIS — J320 Chronic maxillary sinusitis: Secondary | ICD-10-CM | POA: Diagnosis not present

## 2016-03-10 DIAGNOSIS — J324 Chronic pansinusitis: Secondary | ICD-10-CM | POA: Diagnosis not present

## 2016-03-10 DIAGNOSIS — F419 Anxiety disorder, unspecified: Secondary | ICD-10-CM | POA: Insufficient documentation

## 2016-03-10 DIAGNOSIS — J330 Polyp of nasal cavity: Secondary | ICD-10-CM | POA: Insufficient documentation

## 2016-03-10 DIAGNOSIS — J323 Chronic sphenoidal sinusitis: Secondary | ICD-10-CM | POA: Diagnosis not present

## 2016-03-10 DIAGNOSIS — J338 Other polyp of sinus: Secondary | ICD-10-CM | POA: Diagnosis not present

## 2016-03-10 DIAGNOSIS — J342 Deviated nasal septum: Secondary | ICD-10-CM | POA: Insufficient documentation

## 2016-03-10 DIAGNOSIS — E785 Hyperlipidemia, unspecified: Secondary | ICD-10-CM | POA: Insufficient documentation

## 2016-03-10 DIAGNOSIS — J321 Chronic frontal sinusitis: Secondary | ICD-10-CM | POA: Diagnosis not present

## 2016-03-10 HISTORY — DX: Other specified postprocedural states: Z98.890

## 2016-03-10 HISTORY — DX: Essential (primary) hypertension: I10

## 2016-03-10 HISTORY — DX: Gastro-esophageal reflux disease without esophagitis: K21.9

## 2016-03-10 HISTORY — DX: Other specified postprocedural states: R11.2

## 2016-03-10 HISTORY — DX: Pure hypercholesterolemia, unspecified: E78.00

## 2016-03-10 HISTORY — PX: FRONTAL SINUS EXPLORATION: SHX6591

## 2016-03-10 HISTORY — PX: ETHMOIDECTOMY: SHX5197

## 2016-03-10 HISTORY — DX: Anxiety disorder, unspecified: F41.9

## 2016-03-10 HISTORY — PX: SPHENOIDECTOMY: SHX2421

## 2016-03-10 HISTORY — PX: SEPTOPLASTY: SHX2393

## 2016-03-10 HISTORY — PX: NASAL TURBINATE REDUCTION: SHX2072

## 2016-03-10 HISTORY — PX: IMAGE GUIDED SINUS SURGERY: SHX6570

## 2016-03-10 HISTORY — PX: MAXILLARY ANTROSTOMY: SHX2003

## 2016-03-10 SURGERY — SINUS SURGERY, WITH IMAGING GUIDANCE
Anesthesia: General | Site: Nose | Laterality: Bilateral | Wound class: Clean Contaminated

## 2016-03-10 MED ORDER — LIDOCAINE HCL (CARDIAC) 20 MG/ML IV SOLN
INTRAVENOUS | Status: DC | PRN
  Administered 2016-03-10: 40 mg via INTRAVENOUS

## 2016-03-10 MED ORDER — EPHEDRINE SULFATE 50 MG/ML IJ SOLN
INTRAMUSCULAR | Status: DC | PRN
  Administered 2016-03-10: 5 mg via INTRAVENOUS

## 2016-03-10 MED ORDER — BACITRACIN 500 UNIT/GM EX OINT
TOPICAL_OINTMENT | CUTANEOUS | Status: DC | PRN
  Administered 2016-03-10: 1 via TOPICAL

## 2016-03-10 MED ORDER — FENTANYL CITRATE (PF) 100 MCG/2ML IJ SOLN: 25.0000 ug | INTRAMUSCULAR | Status: DC | PRN

## 2016-03-10 MED ORDER — LACTATED RINGERS IV SOLN
INTRAVENOUS | Status: DC
  Administered 2016-03-10: 09:00:00 via INTRAVENOUS

## 2016-03-10 MED ORDER — ONDANSETRON HCL 4 MG/2ML IJ SOLN: 4.0000 mg | Freq: Once | INTRAMUSCULAR | Status: DC | PRN

## 2016-03-10 MED ORDER — ONDANSETRON HCL 4 MG/2ML IJ SOLN
INTRAMUSCULAR | Status: DC | PRN
  Administered 2016-03-10: 4 mg via INTRAVENOUS

## 2016-03-10 MED ORDER — DEXAMETHASONE SODIUM PHOSPHATE 4 MG/ML IJ SOLN
INTRAMUSCULAR | Status: DC | PRN
  Administered 2016-03-10: 10 mg via INTRAVENOUS

## 2016-03-10 MED ORDER — OXYCODONE-ACETAMINOPHEN 5-325 MG PO TABS: 1.0000 | ORAL_TABLET | ORAL | 0 refills | Status: DC | PRN

## 2016-03-10 MED ORDER — OXYMETAZOLINE HCL 0.05 % NA SOLN
6.0000 | Freq: Once | NASAL | Status: AC
  Administered 2016-03-10: 6 via NASAL

## 2016-03-10 MED ORDER — GLYCOPYRROLATE 0.2 MG/ML IJ SOLN
INTRAMUSCULAR | Status: DC | PRN
  Administered 2016-03-10: 0.1 mg via INTRAVENOUS

## 2016-03-10 MED ORDER — OXYCODONE HCL 5 MG/5ML PO SOLN: 5.0000 mg | Freq: Once | ORAL | Status: DC | PRN

## 2016-03-10 MED ORDER — PHENYLEPHRINE HCL 0.5 % NA SOLN
NASAL | Status: DC | PRN
  Administered 2016-03-10: 30 mL via TOPICAL

## 2016-03-10 MED ORDER — ROCURONIUM BROMIDE 100 MG/10ML IV SOLN
INTRAVENOUS | Status: DC | PRN
  Administered 2016-03-10: 20 mg via INTRAVENOUS

## 2016-03-10 MED ORDER — MIDAZOLAM HCL 5 MG/5ML IJ SOLN
INTRAMUSCULAR | Status: DC | PRN
  Administered 2016-03-10: 1 mg via INTRAVENOUS

## 2016-03-10 MED ORDER — ACETAMINOPHEN 10 MG/ML IV SOLN
1000.0000 mg | Freq: Once | INTRAVENOUS | Status: AC
  Administered 2016-03-10: 1000 mg via INTRAVENOUS

## 2016-03-10 MED ORDER — SULFAMETHOXAZOLE-TRIMETHOPRIM 800-160 MG PO TABS: 1.0000 | ORAL_TABLET | Freq: Two times a day (BID) | ORAL | 0 refills | Status: DC

## 2016-03-10 MED ORDER — FENTANYL CITRATE (PF) 100 MCG/2ML IJ SOLN
INTRAMUSCULAR | Status: DC | PRN
  Administered 2016-03-10 (×2): 25 ug via INTRAVENOUS
  Administered 2016-03-10: 50 ug via INTRAVENOUS

## 2016-03-10 MED ORDER — LIDOCAINE-EPINEPHRINE 2 %-1:100000 IJ SOLN
INTRAMUSCULAR | Status: DC | PRN
  Administered 2016-03-10: 12 mL

## 2016-03-10 MED ORDER — PROPOFOL 10 MG/ML IV BOLUS
INTRAVENOUS | Status: DC | PRN
  Administered 2016-03-10: 70 mg via INTRAVENOUS

## 2016-03-10 MED ORDER — OXYCODONE HCL 5 MG PO TABS: 5.0000 mg | ORAL_TABLET | Freq: Once | ORAL | Status: DC | PRN

## 2016-03-10 SURGICAL SUPPLY — 37 items
BATTERY INSTRU NAVIGATION (MISCELLANEOUS) ×12 IMPLANT
BLADE SURG 15 STRL LF DISP TIS (BLADE) IMPLANT
BLADE SURG 15 STRL SS (BLADE)
BTRY SRG DRVR LF (MISCELLANEOUS) ×8
CANISTER SUCT 1200ML W/VALVE (MISCELLANEOUS) ×3 IMPLANT
COAG SUCT 10F 3.5MM HAND CTRL (MISCELLANEOUS) ×3 IMPLANT
CUP MEDICINE 2OZ PLAST GRAD ST (MISCELLANEOUS) ×3 IMPLANT
DRAPE HEAD BAR (DRAPES) ×3 IMPLANT
DRESSING NASL FOAM PST OP SINU (MISCELLANEOUS) ×2 IMPLANT
DRSG NASAL FOAM POST OP SINU (MISCELLANEOUS) ×6
GLOVE BIO SURGEON STRL SZ7.5 (GLOVE) ×8 IMPLANT
HANDLE YANKAUER SUCT BULB TIP (MISCELLANEOUS) ×3 IMPLANT
KIT ROOM TURNOVER OR (KITS) ×3 IMPLANT
NAVIGATION MASK REG  ST (MISCELLANEOUS) ×3 IMPLANT
NDL HYPO 25GX1X1/2 BEV (NEEDLE) ×1 IMPLANT
NEEDLE HYPO 25GX1X1/2 BEV (NEEDLE) ×3 IMPLANT
NS IRRIG 500ML POUR BTL (IV SOLUTION) ×3 IMPLANT
PACK DRAPE NASAL/ENT (PACKS) ×3 IMPLANT
PAD GROUND ADULT SPLIT (MISCELLANEOUS) ×3 IMPLANT
SOL ANTI-FOG 6CC FOG-OUT (MISCELLANEOUS) ×2 IMPLANT
SOL FOG-OUT ANTI-FOG 6CC (MISCELLANEOUS) ×1
SPLINT NASAL SEPTAL BLV .25 LG (MISCELLANEOUS) IMPLANT
SPLINT NASAL SEPTAL BLV .50 ST (MISCELLANEOUS) ×3 IMPLANT
SPONGE NEURO XRAY DETECT 1X3 (DISPOSABLE) ×3 IMPLANT
STRAP BODY AND KNEE 60X3 (MISCELLANEOUS) ×3 IMPLANT
SUT CHROMIC 3-0 (SUTURE) ×3
SUT CHROMIC 3-0 KS 27XMFL CR (SUTURE) ×2
SUT CHROMIC 5-0 (SUTURE)
SUT CHROMIC 5-0 P2 18XMFL CR (SUTURE)
SUT ETHILON 3-0 KS 30 BLK (SUTURE) ×3 IMPLANT
SUT PLAIN GUT 4-0 (SUTURE) IMPLANT
SUTURE CHRMC 3-0 KS 27XMFL CR (SUTURE) ×2 IMPLANT
SUTURE CHRMC 5-0 P2 18XMF CR (SUTURE) IMPLANT
SYRINGE 10CC LL (SYRINGE) ×3 IMPLANT
TOWEL OR 17X26 4PK STRL BLUE (TOWEL DISPOSABLE) ×3 IMPLANT
WATER STERILE IRR 250ML POUR (IV SOLUTION) ×3 IMPLANT
WATER STERILE IRR 500ML POUR (IV SOLUTION) ×3 IMPLANT

## 2016-03-10 NOTE — Anesthesia Preprocedure Evaluation (Signed)
Anesthesia Evaluation  Patient identified by MRN, date of birth, ID band Patient awake    Reviewed: Allergy & Precautions, H&P , NPO status , Patient's Chart, lab work & pertinent test results  History of Anesthesia Complications (+) PONV and history of anesthetic complications  Airway Mallampati: II  TM Distance: >3 FB Neck ROM: full    Dental no notable dental hx.    Pulmonary sleep apnea , COPD,  COPD inhaler,    Pulmonary exam normal        Cardiovascular hypertension, Normal cardiovascular exam     Neuro/Psych    GI/Hepatic GERD  ,  Endo/Other    Renal/GU      Musculoskeletal   Abdominal   Peds  Hematology   Anesthesia Other Findings   Reproductive/Obstetrics                             Anesthesia Physical Anesthesia Plan  ASA: III  Anesthesia Plan: General ETT   Post-op Pain Management:    Induction:   Airway Management Planned:   Additional Equipment:   Intra-op Plan:   Post-operative Plan:   Informed Consent: I have reviewed the patients History and Physical, chart, labs and discussed the procedure including the risks, benefits and alternatives for the proposed anesthesia with the patient or authorized representative who has indicated his/her understanding and acceptance.     Plan Discussed with:   Anesthesia Plan Comments:         Anesthesia Quick Evaluation

## 2016-03-10 NOTE — Anesthesia Procedure Notes (Signed)
Procedure Name: Intubation Date/Time: 03/10/2016 10:49 AM Performed by: Mayme Genta Pre-anesthesia Checklist: Patient identified, Emergency Drugs available, Suction available, Patient being monitored and Timeout performed Patient Re-evaluated:Patient Re-evaluated prior to inductionOxygen Delivery Method: Circle system utilized Preoxygenation: Pre-oxygenation with 100% oxygen Intubation Type: IV induction Ventilation: Mask ventilation without difficulty Laryngoscope Size: Miller and 2 Grade View: Grade I Tube type: Oral Rae Tube size: 7.0 mm Number of attempts: 1 Placement Confirmation: ETT inserted through vocal cords under direct vision,  positive ETCO2 and breath sounds checked- equal and bilateral Tube secured with: Tape Dental Injury: Teeth and Oropharynx as per pre-operative assessment

## 2016-03-10 NOTE — Op Note (Signed)
PREOPERATIVE DIAGNOSIS:  Chronic nasal obstruction. Chronic rhinosinusitis and nasal polyposis  POSTOPERATIVE DIAGNOSIS:  same  SURGEON:  Roena Malady, M.D.  NAME OF PROCEDURE:  1. Nasal septoplasty. 2. Submucous resection of inferior turbinates. 3. Use of Stryker navigation system 4. Bilateral endoscopic anterior posterior ethmoidectomy with removal of tissue 5. Bilateral endoscopic maxillary antrostomy with removal of tissue 6. Bilateral endoscopic sphenoidotomy with removal of tissue 7. Bilateral endoscopic frontal sinusotomy with removal of tissue  OPERATIVE FINDINGS:  Severe nasal septal deformity, hypertrophy of the inferior turbinates. Severe nasal polyposis significant mucosal thickening throughout the sinus cavities.  DESCRIPTION OF THE PROCEDURE:  Kathy Pacheco was identified in the holding area and taken to the operating room and placed in the supine position.  After general endotracheal anesthesia was induced, the table was turned 45 degrees and the patient was placed in a semi-Fowler position.  The nose was then topically anesthetized with Lidocaine, cotton pledgets were placed within each nostril. After approximately 5 minutes, this was removed at which time a local anesthetic of 2% Lidocaine 1:100,000 units of Epinephrine was used to inject the inferior turbinates in the nasal septum. A total of 12 ml was used. Examination of the nose showed a severe left nasal septal deformity and tremendous hypertrophied inferior turbinate.  Beginning on the right hand side a hemitransfixion incision was then created on the leading edge of the septum on the right.  A subperichondrial plane was elevated posteriorly on the left and taken back to the perpendicular plate of the ethmoid where subperiosteal plane was elevated posteriorly on the left.  An inferior rim of cartilage was removed anteriorly with care taken to leave an anterior strut to prevent nasal collapse. With this strut removed  the perpendicular plate of the ethmoid was separated from the quadrangular cartilage. The large septal spur was removed.  The septum was then replaced in the midline. Reinspection through each nostril showed excellent reduction of the septal deformity. A left posterior inferior fenestration was then created to allow hematoma drainage.  With the septoplasty completed, the operation then turned to the endoscopic sinus portion. The Stryker navigator had been applied and calibrated and remained on throughout the case this was done at the beginning of the case. Using the Stryker navigation instruments and the 0 endoscope left nostril was examined. There was significant polypoid disease emanating from the ostiomeatal region which was removed the uncinate process was identified and taken down using the Cottle elevator there was thickened mucosa throughout the sinus which was removed straight and side biting forceps were used to open the antrostomy widely. The turbinate had significant polypoid disease on it which was removed using the straight forceps ethmoid bulla was identified and opened using the suction the straight and 45 forceps. The anterior posterior ethmoids were cleaned using the straight and 45 forceps. Sphenoid sinus was entered again there was polypoid disease which was removed this or sinus was opened widely. The nasofrontal region was then probed again there was polypoid disease in this region which was removed opening into the frontal sinus. With the left side completed a cottonoid pledget was placed with phenylephrine lidocaine solution. The right side was then examined using the 0 endoscope there was a very large antrochoanal polyp which was pulled from the ostiomeatal region removing this large polyp and its entirety. The middle turbinate was gently medialized the max or sinus was widely opened using straight and side biting forceps and any polypoid disease was removed. Thick inspissated mucus  which  was sent for a culture. Ethmoid bulla was identified anterior posterior ethmoid cells were opened and cleaned using straight and 45 forceps was significant polypoid disease which was removed. The sphenoid sinus was also opened and cleaned as was the frontal sinus on the right. It was polypoid disease throughout. A cottonoid pledget was then placed in the right the left side was examined with the pledget removed any small polypoid disease or bone fragments were removed the pledget was removed from the right again any polypoid disease was removed that was identified. No active bleeding the operation then turned to the submucous resection of the inferior turbinates.  Beginning on the left-hand side, a 15 blade was used to incise along the inferior edge of the inferior turbinate. A superior laterally based flap was then elevated. The underlying conchal bone of mucosa was excised using Knight scissors. The flap was then laid back over the turbinate stump and cauterized using suction cautery. In a similar fashion the submucous resection was performed on the right.  With the submucous resection completed bilaterally and no active bleeding, the hemitransfixion incision was then closed using two interrupted 3-0 chromic sutures.  Plastic nasal septal splints were placed within each nostril and affixed to the septum using a 3-0 nylon suture. Stammberger was then used beneath each inferior turbinate for hemostasis.    The patient tolerated the procedure well, was returned to anesthesia, extubated in the operating room, and taken to the recovery room in stable condition.    CULTURES:  right maxillary sinus  SPECIMENS:  sinus contents  ESTIMATED BLOOD LOSS:  80 cc  Kathy Pacheco  03/10/2016  12:02 PM

## 2016-03-10 NOTE — H&P (Signed)
The patient's history has been reviewed, patient examined, no change in status, stable for surgery.  Questions were answered to the patients satisfaction.  

## 2016-03-10 NOTE — Transfer of Care (Signed)
Immediate Anesthesia Transfer of Care Note  Patient: Kathy Pacheco  Procedure(s) Performed: Procedure(s) with comments: IMAGE GUIDED SINUS SURGERY (Bilateral) - Need Disk GAVE DISK TO CECE 1-25 KP SEPTOPLASTY (Bilateral) TURBINATE REDUCTION/SUBMUCOSAL RESECTION (Bilateral) SPHENOIDECTOMY WITH TISSUE REMOVAL (Bilateral) FRONTAL WITH TISSUE REMOVAL (Bilateral) MAXILLARY ANTROSTOMY (Bilateral) ETHMOIDECTOMY (Bilateral)  Patient Location: PACU  Anesthesia Type: General ETT  Level of Consciousness: awake, alert  and patient cooperative  Airway and Oxygen Therapy: Patient Spontanous Breathing and Patient connected to supplemental oxygen  Post-op Assessment: Post-op Vital signs reviewed, Patient's Cardiovascular Status Stable, Respiratory Function Stable, Patent Airway and No signs of Nausea or vomiting  Post-op Vital Signs: Reviewed and stable  Complications: No apparent anesthesia complications

## 2016-03-10 NOTE — Anesthesia Postprocedure Evaluation (Signed)
Anesthesia Post Note  Patient: [name]  Procedure(s) Performed: Procedure(s) (LRB): IMAGE GUIDED SINUS SURGERY (Bilateral) SEPTOPLASTY (Bilateral) TURBINATE REDUCTION/SUBMUCOSAL RESECTION (Bilateral) SPHENOIDECTOMY WITH TISSUE REMOVAL (Bilateral) FRONTAL WITH TISSUE REMOVAL (Bilateral) MAXILLARY ANTROSTOMY (Bilateral) ETHMOIDECTOMY (Bilateral)  Patient location during evaluation: PACU Anesthesia Type: General Level of consciousness: awake and alert and oriented Pain management: satisfactory to patient Vital Signs Assessment: post-procedure vital signs reviewed and stable Respiratory status: spontaneous breathing, nonlabored ventilation and respiratory function stable Cardiovascular status: blood pressure returned to baseline and stable Postop Assessment: Adequate PO intake and No signs of nausea or vomiting Anesthetic complications: no Comments: Mild B basal crackles.  Responds well do recruiting maneuvers.  Instructed pt and family, who acknowledged understanding.    Raliegh Ip

## 2016-03-13 ENCOUNTER — Encounter: Payer: Self-pay | Admitting: Unknown Physician Specialty

## 2016-03-14 DIAGNOSIS — J3489 Other specified disorders of nose and nasal sinuses: Secondary | ICD-10-CM | POA: Diagnosis not present

## 2016-03-14 DIAGNOSIS — J324 Chronic pansinusitis: Secondary | ICD-10-CM | POA: Diagnosis not present

## 2016-03-14 LAB — SURGICAL PATHOLOGY

## 2016-03-15 LAB — AEROBIC/ANAEROBIC CULTURE (SURGICAL/DEEP WOUND)

## 2016-03-15 LAB — AEROBIC/ANAEROBIC CULTURE W GRAM STAIN (SURGICAL/DEEP WOUND)

## 2016-03-27 DIAGNOSIS — J3489 Other specified disorders of nose and nasal sinuses: Secondary | ICD-10-CM | POA: Diagnosis not present

## 2016-03-27 DIAGNOSIS — J324 Chronic pansinusitis: Secondary | ICD-10-CM | POA: Diagnosis not present

## 2016-04-19 DIAGNOSIS — H903 Sensorineural hearing loss, bilateral: Secondary | ICD-10-CM | POA: Diagnosis not present

## 2016-04-19 DIAGNOSIS — J320 Chronic maxillary sinusitis: Secondary | ICD-10-CM | POA: Diagnosis not present

## 2016-04-19 DIAGNOSIS — J324 Chronic pansinusitis: Secondary | ICD-10-CM | POA: Diagnosis not present

## 2016-04-19 DIAGNOSIS — H908 Mixed conductive and sensorineural hearing loss, unspecified: Secondary | ICD-10-CM | POA: Diagnosis not present

## 2016-04-24 ENCOUNTER — Ambulatory Visit (INDEPENDENT_AMBULATORY_CARE_PROVIDER_SITE_OTHER): Payer: Medicare Other | Admitting: Family Medicine

## 2016-04-24 VITALS — BP 124/62 | HR 78 | Temp 97.8°F | Resp 16 | Wt 147.0 lb

## 2016-04-24 DIAGNOSIS — D649 Anemia, unspecified: Secondary | ICD-10-CM | POA: Diagnosis not present

## 2016-04-24 DIAGNOSIS — E7800 Pure hypercholesterolemia, unspecified: Secondary | ICD-10-CM

## 2016-04-24 DIAGNOSIS — I10 Essential (primary) hypertension: Secondary | ICD-10-CM

## 2016-04-24 NOTE — Progress Notes (Signed)
Kathy Pacheco  MRN: [id number] DOB: 17-Jan-1932  Subjective:  HPI   The patient is an 83 year old female who presents for follow up of her hypertension, cholesterol, anemia and anxiety. She is doing well with her blood pressure and states she is doing some relaxation exercises that help her to keep her blood pressure down.   She is no longer taking any Xanax and feels her anxiety is good.  Patient Active Problem List   Diagnosis Date Noted  . Absolute anemia 11/03/2014  . Anxiety 11/03/2014  . Bilateral cataracts 11/03/2014  . COPD, mild ([place]) 11/03/2014  . Acute cystitis 11/03/2014  . 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 Esophagitis, reflux 11/03/2014  . Anxiety, generalized 11/03/2014  . Acid reflux 11/03/2014  . Hypercholesteremia 11/03/2014  . HLD (hyperlipidemia) 11/03/2014  . BP (high blood pressure) 11/03/2014  . Affective disorder, major 11/03/2014  . Menopausal and perimenopausal disorder 11/03/2014  . Obstructive apnea 11/03/2014  . Awareness of heartbeats 11/03/2014  . Solitary pulmonary nodule 11/03/2014  . Hypercholesterolemia without hypertriglyceridemia 11/03/2014  . Allergic rhinitis, seasonal 11/03/2014  . H/O cataract extraction 11/03/2014  . Apnea, sleep 12/16/2013    Past Medical History:  Diagnosis Date  . Anxiety   . GERD (gastroesophageal reflux disease)   . Hypercholesteremia   . Hypertension   . PONV (postoperative nausea and vomiting)     Social History   Social History  . Marital status: Widowed    Spouse name: N/A  . Number of children: N/A  . Years of education: N/A   Occupational History  . Not on file.   Social History Main Topics  . Smoking status: Never Smoker  . Smokeless tobacco: Never Used  . Alcohol use No  . Drug use: No  . Sexual activity: Not on file   Other Topics Concern  . Not on file   Social History Narrative  . No narrative on file    Outpatient Encounter Prescriptions as of 04/24/2016  Medication  Sig Note  . acetaminophen (TYLENOL) 500 MG tablet Take by mouth. 11/04/2014: Received from: [place]  . albuterol (PROVENTIL HFA;VENTOLIN HFA) 108 (90 Base) MCG/ACT inhaler Inhale into the lungs every 6 (six) hours as needed for wheezing or shortness of breath.   Marland Kitchen amLODipine (NORVASC) 5 MG tablet TAKE 1 TABLET (5 MG TOTAL) BY MOUTH DAILY.   . busPIRone (BUSPAR) 10 MG tablet Take 1 tablet (10 mg total) by mouth 2 (two) times daily.   . calcium carbonate (OS-CAL) 600 MG TABS tablet Take by mouth. 11/03/2014: Received from: [facility]  . citalopram (CELEXA) 10 MG tablet TAKE 1 TABLET (10 MG TOTAL) BY MOUTH DAILY.   Marland Kitchen Fluticasone-Salmeterol (ADVAIR DISKUS) 500-50 MCG/DOSE AEPB Inhale into the lungs. 11/03/2014: Samples given # 2 Lot [id number] 02/2015 Received from: [facility]  . loratadine (CLARITIN) 10 MG tablet TAKE 1 TABLET BY MOUTH EVERY DAY   . mometasone (ELOCON) 0.1 % cream MOMETASONE FUROATE, 0.1% (External Cream)  1 (one) application application nightly as needed for 0 days  Quantity: 30;  Refills: 1   Ordered :28-Oct-2013  Miguel Aschoff MD;  Started 04-August-2013 Active Comments: Medication taken as needed. dab on fingertip to external ear nightly prn 11/03/2014: Medication taken as needed. dab on fingertip to external ear nightly prn Received from: [facility]  . omeprazole (PRILOSEC) 20 MG capsule Take 1 capsule (20 mg total) by mouth daily.   . rosuvastatin (CRESTOR) 5  MG tablet TAKE 1 TABLET (5 MG TOTAL) BY MOUTH DAILY.   . [DISCONTINUED] oxyCODONE-acetaminophen (ROXICET) 5-325 MG tablet Take 1-2 tablets by mouth every 4 (four) hours as needed.   . [DISCONTINUED] predniSONE (DELTASONE) 10 MG tablet Take 60 mg by mouth daily with breakfast.   . [DISCONTINUED] sulfamethoxazole-trimethoprim (BACTRIM DS,SEPTRA DS) 800-160 MG tablet Take 1 tablet by mouth 2 (two) times daily.    No facility-administered encounter  medications on file as of 04/24/2016.     Allergies  Allergen Reactions  . Ciprofloxacin Itching    Review of Systems  Constitutional: Negative for chills, fever, malaise/fatigue and weight loss.  HENT: Positive for hearing loss (In the process of getting hearing aids.).   Respiratory: Positive for cough (secondary to intubaion from surgery). Negative for shortness of breath and wheezing.   Cardiovascular: Negative for chest pain, palpitations, orthopnea, claudication, leg swelling and PND.  Neurological: Negative for weakness.  Psychiatric/Behavioral: Negative for depression, hallucinations, memory loss, substance abuse and suicidal ideas. The patient is not nervous/anxious and does not have insomnia.     Objective:  BP 124/62 (BP Location: Right Arm, Patient Position: Sitting, Cuff Size: Normal)   Pulse 78   Temp 97.8 F (36.6 C) (Oral)   Resp 16   Wt 147 lb (66.7 kg)   BMI 27.78 kg/m   Physical Exam  Constitutional: She is oriented to person, place, and time and well-developed, well-nourished, and in no distress.  HENT:  Head: Normocephalic and atraumatic.  Eyes: Conjunctivae are normal. Pupils are equal, round, and reactive to light.  Neck: Normal range of motion. Neck supple.  Cardiovascular: Normal rate, regular rhythm and normal heart sounds.   Pulmonary/Chest: Effort normal and breath sounds normal.  Neurological: She is alert and oriented to person, place, and time. Gait normal.  Skin: Skin is warm and dry.  Psychiatric: Mood, affect and judgment normal.    Assessment and Plan :   1. Essential hypertension  - CBC with Differential/Platelet - Comprehensive metabolic panel - TSH  2. Anemia, unspecified type  - CBC with Differential/Platelet  3. Hypercholesterolemia without hypertriglyceridemia  - Comprehensive metabolic panel - Lipid Panel With LDL/HDL Ratio 4.s/p recent Sinus surgery  HPI, Exam and A&P Transcribed under the direction and in the presence  of [name]., MD. Electronically Signed: Althea Charon, RMA I have done the exam and reviewed the chart and it is accurate to the best of my knowledge. Development worker, community has been used and  any errors in dictation or transcription are unintentional. Miguel Aschoff M.D. [facility]

## 2016-04-25 DIAGNOSIS — E7800 Pure hypercholesterolemia, unspecified: Secondary | ICD-10-CM | POA: Diagnosis not present

## 2016-04-25 DIAGNOSIS — I10 Essential (primary) hypertension: Secondary | ICD-10-CM | POA: Diagnosis not present

## 2016-04-25 DIAGNOSIS — D649 Anemia, unspecified: Secondary | ICD-10-CM | POA: Diagnosis not present

## 2016-04-26 LAB — CBC WITH DIFFERENTIAL/PLATELET
BASOS ABS: 0.1 10*3/uL (ref 0.0–0.2)
Basos: 1 %
EOS (ABSOLUTE): 0.5 10*3/uL — AB (ref 0.0–0.4)
Eos: 7 %
HEMOGLOBIN: 12.1 g/dL (ref 11.1–15.9)
Hematocrit: 37.1 % (ref 34.0–46.6)
IMMATURE GRANS (ABS): 0 10*3/uL (ref 0.0–0.1)
Immature Granulocytes: 0 %
LYMPHS ABS: 1.5 10*3/uL (ref 0.7–3.1)
LYMPHS: 23 %
MCH: 28.4 pg (ref 26.6–33.0)
MCHC: 32.6 g/dL (ref 31.5–35.7)
MCV: 87 fL (ref 79–97)
MONOCYTES: 14 %
Monocytes Absolute: 0.9 10*3/uL (ref 0.1–0.9)
Neutrophils Absolute: 3.7 10*3/uL (ref 1.4–7.0)
Neutrophils: 55 %
Platelets: 326 10*3/uL (ref 150–379)
RBC: 4.26 x10E6/uL (ref 3.77–5.28)
RDW: 14.7 % (ref 12.3–15.4)
WBC: 6.7 10*3/uL (ref 3.4–10.8)

## 2016-04-26 LAB — COMPREHENSIVE METABOLIC PANEL
ALBUMIN: 4.1 g/dL (ref 3.5–4.7)
ALT: 13 IU/L (ref 0–32)
AST: 16 IU/L (ref 0–40)
Albumin/Globulin Ratio: 1.7 (ref 1.2–2.2)
Alkaline Phosphatase: 90 IU/L (ref 39–117)
BUN / CREAT RATIO: 21 (ref 12–28)
BUN: 21 mg/dL (ref 8–27)
Bilirubin Total: 0.2 mg/dL (ref 0.0–1.2)
CALCIUM: 9.5 mg/dL (ref 8.7–10.3)
CO2: 25 mmol/L (ref 18–29)
CREATININE: 1 mg/dL (ref 0.57–1.00)
Chloride: 102 mmol/L (ref 96–106)
GFR, EST AFRICAN AMERICAN: 61 mL/min/{1.73_m2} (ref >59)
GFR, EST NON AFRICAN AMERICAN: 53 mL/min/{1.73_m2} — AB (ref >59)
GLOBULIN, TOTAL: 2.4 g/dL (ref 1.5–4.5)
GLUCOSE: 86 mg/dL (ref 65–99)
Potassium: 4.3 mmol/L (ref 3.5–5.2)
SODIUM: 142 mmol/L (ref 134–144)
TOTAL PROTEIN: 6.5 g/dL (ref 6.0–8.5)

## 2016-04-26 LAB — LIPID PANEL WITH LDL/HDL RATIO
CHOLESTEROL TOTAL: 179 mg/dL (ref 100–199)
HDL: 48 mg/dL (ref >39)
LDL Calculated: 111 mg/dL — ABNORMAL HIGH (ref 0–99)
LDL/HDL RATIO: 2.3 ratio (ref 0.0–3.2)
Triglycerides: 99 mg/dL (ref 0–149)
VLDL CHOLESTEROL CAL: 20 mg/dL (ref 5–40)

## 2016-04-26 LAB — TSH: TSH: 1.35 u[IU]/mL (ref 0.450–4.500)

## 2016-05-04 ENCOUNTER — Ambulatory Visit: Payer: Medicare Other | Admitting: Family Medicine

## 2016-06-02 DIAGNOSIS — H26493 Other secondary cataract, bilateral: Secondary | ICD-10-CM | POA: Diagnosis not present

## 2016-06-21 DIAGNOSIS — Z961 Presence of intraocular lens: Secondary | ICD-10-CM | POA: Diagnosis not present

## 2016-06-26 DIAGNOSIS — E7800 Pure hypercholesterolemia, unspecified: Secondary | ICD-10-CM | POA: Diagnosis not present

## 2016-06-26 DIAGNOSIS — I10 Essential (primary) hypertension: Secondary | ICD-10-CM | POA: Diagnosis not present

## 2016-06-26 DIAGNOSIS — F419 Anxiety disorder, unspecified: Secondary | ICD-10-CM | POA: Diagnosis not present

## 2016-07-20 DIAGNOSIS — J320 Chronic maxillary sinusitis: Secondary | ICD-10-CM | POA: Diagnosis not present

## 2016-08-04 ENCOUNTER — Other Ambulatory Visit: Payer: Self-pay | Admitting: Family Medicine

## 2016-08-28 ENCOUNTER — Ambulatory Visit (INDEPENDENT_AMBULATORY_CARE_PROVIDER_SITE_OTHER): Payer: Medicare Other | Admitting: Family Medicine

## 2016-08-28 VITALS — BP 102/54 | HR 66 | Temp 97.9°F | Resp 12 | Wt 151.6 lb

## 2016-08-28 DIAGNOSIS — E7800 Pure hypercholesterolemia, unspecified: Secondary | ICD-10-CM | POA: Diagnosis not present

## 2016-08-28 DIAGNOSIS — I10 Essential (primary) hypertension: Secondary | ICD-10-CM

## 2016-08-28 DIAGNOSIS — D649 Anemia, unspecified: Secondary | ICD-10-CM

## 2016-08-28 DIAGNOSIS — R0981 Nasal congestion: Secondary | ICD-10-CM | POA: Diagnosis not present

## 2016-08-28 DIAGNOSIS — K2100 Gastro-esophageal reflux disease with esophagitis, without bleeding: Secondary | ICD-10-CM

## 2016-08-28 NOTE — Progress Notes (Signed)
Kathy Pacheco  MRN: [id number] DOB: [birth year]  Subjective:  HPI  Patient is here for 4 months follow up. Last office visit was on 04/24/16. Routine lab work was done at that time and was stable. Lab Results  Component Value Date   WBC 6.7 04/25/2016   HGB 12.1 04/25/2016   HCT 37.1 04/25/2016   MCV 87 04/25/2016   PLT 326 04/25/2016   Hypertension: patient checks her b/p and readings have been around 106/64-65. No cardiac symptoms. BP Readings from Last 3 Encounters:  08/28/16 (!) 102/54  04/24/16 124/62  03/10/16 (!) 122/57   GERD: takes Omeprazole daily. Sometimes has acid reflux issue if she eats too late or if she eats greasy foods and portion control.  Hyperlipidemia: patient is taking on Crestor. Lab Results  Component Value Date   CHOL 179 04/25/2016   HDL 48 04/25/2016   LDLCALC 111 (H) 04/25/2016   TRIG 99 04/25/2016     Patient Active Problem List   Diagnosis Date Noted  . Absolute anemia 11/03/2014  . Anxiety 11/03/2014  . Bilateral cataracts 11/03/2014  . COPD, mild ([place]) 11/03/2014  . Acute cystitis 11/03/2014  . 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 Esophagitis, reflux 11/03/2014  . Anxiety, generalized 11/03/2014  . Acid reflux 11/03/2014  . Hypercholesteremia 11/03/2014  . HLD (hyperlipidemia) 11/03/2014  . BP (high blood pressure) 11/03/2014  . Affective disorder, major 11/03/2014  . Menopausal and perimenopausal disorder 11/03/2014  . Obstructive apnea 11/03/2014  . Awareness of heartbeats 11/03/2014  . Solitary pulmonary nodule 11/03/2014  . Hypercholesterolemia without hypertriglyceridemia 11/03/2014  . Allergic rhinitis, seasonal 11/03/2014  . H/O cataract extraction 11/03/2014  . Apnea, sleep 12/16/2013    Past Medical History:  Diagnosis Date  . Anxiety   . GERD (gastroesophageal reflux disease)   . Hypercholesteremia   . Hypertension   . PONV (postoperative nausea and vomiting)     Social History   Social History  .  Marital status: Widowed    Spouse name: N/A  . Number of children: N/A  . Years of education: N/A   Occupational History  . Not on file.   Social History Main Topics  . Smoking status: Never Smoker  . Smokeless tobacco: Never Used  . Alcohol use No  . Drug use: No  . Sexual activity: Not on file   Other Topics Concern  . Not on file   Social History Narrative  . No narrative on file    Outpatient Encounter Prescriptions as of 08/28/2016  Medication Sig Note  . acetaminophen (TYLENOL) 500 MG tablet Take by mouth. 11/04/2014: Received from: [place]  . albuterol (PROVENTIL HFA;VENTOLIN HFA) 108 (90 Base) MCG/ACT inhaler Inhale into the lungs every 6 (six) hours as needed for wheezing or shortness of breath.   Marland Kitchen amLODipine (NORVASC) 5 MG tablet TAKE 1 TABLET (5 MG TOTAL) BY MOUTH DAILY.   . busPIRone (BUSPAR) 10 MG tablet TAKE 1 TABLET (10 MG TOTAL) BY MOUTH 2 (TWO) TIMES DAILY.   . calcium carbonate (OS-CAL) 600 MG TABS tablet Take by mouth. 11/03/2014: Received from: [facility]  . citalopram (CELEXA) 10 MG tablet TAKE 1 TABLET (10 MG TOTAL) BY MOUTH DAILY.   . fluticasone (FLONASE) 50 MCG/ACT nasal spray 2 SPRAYS EACH NOSTRIL TWICE DAILY   . Fluticasone-Salmeterol (ADVAIR DISKUS) 500-50 MCG/DOSE AEPB Inhale into the lungs. 11/03/2014: Samples given # 2 Lot [id number] 02/2015 Received from: [facility]  . loratadine (CLARITIN)  10 MG tablet TAKE 1 TABLET BY MOUTH EVERY DAY   . mometasone (ELOCON) 0.1 % cream MOMETASONE FUROATE, 0.1% (External Cream)  1 (one) application application nightly as needed for 0 days  Quantity: 30;  Refills: 1   Ordered :28-Oct-2013  Miguel Aschoff MD;  Started 04-August-2013 Active Comments: Medication taken as needed. dab on fingertip to external ear nightly prn 11/03/2014: Medication taken as needed. dab on fingertip to external ear nightly prn Received from: [facility]  .  omeprazole (PRILOSEC) 20 MG capsule TAKE 1 CAPSULE (20 MG TOTAL) BY MOUTH DAILY.   . rosuvastatin (CRESTOR) 5 MG tablet TAKE 1 TABLET (5 MG TOTAL) BY MOUTH DAILY.    No facility-administered encounter medications on file as of 08/28/2016.     Allergies  Allergen Reactions  . Ciprofloxacin Itching    Review of Systems  Constitutional: Negative.   Eyes: Negative.   Respiratory: Negative.   Cardiovascular: Negative.   Gastrointestinal: Positive for heartburn.  Musculoskeletal: Negative.   Skin: Negative.   Neurological: Negative.   Endo/Heme/Allergies: Negative.   Psychiatric/Behavioral: Negative.     Objective:  BP (!) 102/54   Pulse 66   Temp 97.9 F (36.6 C)   Resp 12   Wt 151 lb 9.6 oz (68.8 kg)   BMI 28.64 kg/m   Physical Exam  Constitutional: She is oriented to person, place, and time and well-developed, well-nourished, and in no distress.  HENT:  Head: Normocephalic and atraumatic.  Eyes: Conjunctivae are normal.  Neck: No thyromegaly present.  Cardiovascular: Normal rate, regular rhythm and normal heart sounds.   Pulmonary/Chest: Effort normal and breath sounds normal.  Abdominal: Soft.  Neurological: She is alert and oriented to person, place, and time. Gait normal. GCS score is 15.  Skin: Skin is warm and dry.  Psychiatric: Mood, memory, affect and judgment normal.    Assessment and Plan :  HTN MDD/GAD HLD GERD  I have done the exam and reviewed the chart and it is accurate to the best of my knowledge. Development worker, community has been used and  any errors in dictation or transcription are unintentional. Miguel Aschoff M.D. [facility]

## 2016-11-01 ENCOUNTER — Other Ambulatory Visit: Payer: Self-pay | Admitting: Family Medicine

## 2016-11-22 ENCOUNTER — Ambulatory Visit (INDEPENDENT_AMBULATORY_CARE_PROVIDER_SITE_OTHER): Payer: Medicare Other

## 2016-11-22 ENCOUNTER — Other Ambulatory Visit: Payer: Self-pay | Admitting: Family Medicine

## 2016-11-22 DIAGNOSIS — J339 Nasal polyp, unspecified: Secondary | ICD-10-CM | POA: Diagnosis not present

## 2016-11-22 DIAGNOSIS — Z23 Encounter for immunization: Secondary | ICD-10-CM | POA: Diagnosis not present

## 2016-11-22 DIAGNOSIS — J320 Chronic maxillary sinusitis: Secondary | ICD-10-CM | POA: Diagnosis not present

## 2016-12-12 ENCOUNTER — Encounter: Payer: Medicare Other | Admitting: Family Medicine

## 2016-12-12 ENCOUNTER — Ambulatory Visit: Payer: Medicare Other

## 2016-12-14 ENCOUNTER — Telehealth: Payer: Self-pay | Admitting: Emergency Medicine

## 2016-12-14 ENCOUNTER — Ambulatory Visit (INDEPENDENT_AMBULATORY_CARE_PROVIDER_SITE_OTHER): Payer: Medicare Other | Admitting: Family Medicine

## 2016-12-14 ENCOUNTER — Ambulatory Visit: Payer: Medicare Other

## 2016-12-14 ENCOUNTER — Ambulatory Visit
Admission: RE | Admit: 2016-12-14 | Discharge: 2016-12-14 | Disposition: A | Discharge status: Home / Self Care | Payer: Medicare Other | Source: Ambulatory Visit | Attending: Family Medicine | Admitting: Family Medicine

## 2016-12-14 ENCOUNTER — Encounter: Payer: Medicare Other | Admitting: Family Medicine

## 2016-12-14 ENCOUNTER — Encounter: Payer: Self-pay | Admitting: Family Medicine

## 2016-12-14 VITALS — BP 134/68 | HR 68 | Temp 97.8°F | Resp 14 | Ht 61.0 in | Wt 155.0 lb

## 2016-12-14 DIAGNOSIS — R05 Cough: Secondary | ICD-10-CM | POA: Insufficient documentation

## 2016-12-14 DIAGNOSIS — I700 Atherosclerosis of aorta: Secondary | ICD-10-CM | POA: Insufficient documentation

## 2016-12-14 DIAGNOSIS — Z0000 Encounter for general adult medical examination without abnormal findings: Secondary | ICD-10-CM | POA: Diagnosis not present

## 2016-12-14 DIAGNOSIS — J40 Bronchitis, not specified as acute or chronic: Secondary | ICD-10-CM

## 2016-12-14 DIAGNOSIS — E7800 Pure hypercholesterolemia, unspecified: Secondary | ICD-10-CM | POA: Diagnosis not present

## 2016-12-14 DIAGNOSIS — J9811 Atelectasis: Secondary | ICD-10-CM | POA: Insufficient documentation

## 2016-12-14 DIAGNOSIS — R059 Cough, unspecified: Secondary | ICD-10-CM

## 2016-12-14 MED ORDER — AZITHROMYCIN 250 MG PO TABS: ORAL_TABLET | ORAL | 0 refills | Status: DC

## 2016-12-14 MED ORDER — ROSUVASTATIN CALCIUM 5 MG PO TABS: 5.0000 mg | ORAL_TABLET | Freq: Every day | ORAL | 3 refills | Status: DC

## 2016-12-14 NOTE — Progress Notes (Signed)
Patient: Kathy Pacheco, Female    DOB: 28-May-1931, 83 y.o.   MRN: [id number] Visit Date: 12/14/2016  Today's Provider: [name], MD   Chief Complaint  Patient presents with  . Medicare Wellness   Subjective:    Annual wellness visit Kathy Pacheco is a 83 y.o. female. She feels fairly well. She reports no formal exercising but stays very active and still does her own yard work. She reports she is sleeping fairly well.  ----------------------------------------------------------- Mammogram- 11/23/15 negative BMD- 03/11/12 osteopenia Pap- 09/10/07 normal Colonoscopy- 06/01/03 hyperplastic polyps Dr. Vira Agar  Review of Systems  Constitutional: Negative.   HENT: Negative.   Eyes: Negative.   Respiratory: Negative.   Cardiovascular: Negative.   Gastrointestinal: Negative.   Endocrine: Negative.   Genitourinary: Negative.   Musculoskeletal: Negative.   Skin: Negative.   Allergic/Immunologic: Negative.   Neurological: Negative.   Hematological: Negative.   Psychiatric/Behavioral: Negative.     Social History   Socioeconomic History  . Marital status: Widowed    Spouse name: Not on file  . Number of children: Not on file  . Years of education: Not on file  . Highest education level: Not on file  Social Needs  . Financial resource strain: Not on file  . Food insecurity - worry: Not on file  . Food insecurity - inability: Not on file  . Transportation needs - medical: Not on file  . Transportation needs - non-medical: Not on file  Occupational History  . Not on file  Tobacco Use  . Smoking status: Never Smoker  . Smokeless tobacco: Never Used  Substance and Sexual Activity  . Alcohol use: No    Alcohol/week: 0.0 oz  . Drug use: No  . Sexual activity: Not on file  Other Topics Concern  . Not on file  Social History Narrative  . Not on file    Past Medical History:  Diagnosis Date  . Anxiety   . GERD (gastroesophageal reflux disease)   .  Hypercholesteremia   . Hypertension   . PONV (postoperative nausea and vomiting)      Patient Active Problem List   Diagnosis Date Noted  . Absolute anemia 11/03/2014  . Anxiety 11/03/2014  . Bilateral cataracts 11/03/2014  . COPD, mild ([place]) 11/03/2014  . Acute cystitis 11/03/2014  . 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 Esophagitis, reflux 11/03/2014  . Anxiety, generalized 11/03/2014  . Acid reflux 11/03/2014  . Hypercholesteremia 11/03/2014  . HLD (hyperlipidemia) 11/03/2014  . BP (high blood pressure) 11/03/2014  . Affective disorder, major 11/03/2014  . Menopausal and perimenopausal disorder 11/03/2014  . Obstructive apnea 11/03/2014  . Awareness of heartbeats 11/03/2014  . Solitary pulmonary nodule 11/03/2014  . Hypercholesterolemia without hypertriglyceridemia 11/03/2014  . Allergic rhinitis, seasonal 11/03/2014  . H/O cataract extraction 11/03/2014  . Apnea, sleep 12/16/2013    Past Surgical History:  Procedure Laterality Date  . CATARACT EXTRACTION Bilateral   . HERNIA REPAIR      Her family history includes Allergies in her father; Arthritis in her brother; Breast cancer (age of onset: 52) in her sister; COPD in her brother, father, and sister; CVA in her brother, brother, and father; Cancer in her brother, maternal grandmother, and sister; Emphysema in her father; Epilepsy in her mother; Healthy in her brother and brother; Heart disease in her brother; Hypertension in her father and maternal grandmother; Lung cancer in her father; Osteoporosis in her mother and sister.  Current Outpatient Medications:  .  acetaminophen (TYLENOL) 500 MG tablet, Take by mouth., Disp: , Rfl:  .  albuterol (PROVENTIL HFA;VENTOLIN HFA) 108 (90 Base) MCG/ACT inhaler, Inhale into the lungs every 6 (six) hours as needed for wheezing or shortness of breath., Disp: , Rfl:  .  amLODipine (NORVASC) 5 MG tablet, TAKE 1 TABLET (5 MG TOTAL) BY MOUTH DAILY., Disp: 90 tablet, Rfl: 3 .   amoxicillin (AMOXIL) 500 MG capsule, TAKE ONE CAPSULE BY MOUTH 3 TIMES A DAY FOR 7 DAYS, Disp: , Rfl: 0 .  busPIRone (BUSPAR) 10 MG tablet, TAKE 1 TABLET (10 MG TOTAL) BY MOUTH 2 (TWO) TIMES DAILY., Disp: 180 tablet, Rfl: 3 .  calcium carbonate (OS-CAL) 600 MG TABS tablet, Take by mouth., Disp: , Rfl:  .  citalopram (CELEXA) 10 MG tablet, TAKE 1 TABLET (10 MG TOTAL) BY MOUTH DAILY., Disp: 90 tablet, Rfl: 3 .  fluticasone (FLONASE) 50 MCG/ACT nasal spray, 2 SPRAYS EACH NOSTRIL TWICE DAILY, Disp: , Rfl: 9 .  Fluticasone-Salmeterol (ADVAIR DISKUS) 500-50 MCG/DOSE AEPB, Inhale into the lungs., Disp: , Rfl:  .  mometasone (ELOCON) 0.1 % cream, MOMETASONE FUROATE, 0.1% (External Cream)  1 (one) application application nightly as needed for 0 days  Quantity: 30;  Refills: 1   Ordered :28-Oct-2013  Miguel Aschoff MD;  Started 04-August-2013 Active Comments: Medication taken as needed. dab on fingertip to external ear nightly prn, Disp: , Rfl:  .  omeprazole (PRILOSEC) 20 MG capsule, TAKE 1 CAPSULE (20 MG TOTAL) BY MOUTH DAILY., Disp: 90 capsule, Rfl: 3 .  rosuvastatin (CRESTOR) 5 MG tablet, TAKE 1 TABLET (5 MG TOTAL) BY MOUTH DAILY., Disp: 30 tablet, Rfl: 11 .  loratadine (CLARITIN) 10 MG tablet, TAKE 1 TABLET BY MOUTH EVERY DAY (Patient not taking: Reported on 12/14/2016), Disp: 30 tablet, Rfl: 12  Patient Care Team: Jerrol Banana., MD as PCP - General (Family Medicine)     Objective:   Vitals: BP 134/68 (BP Location: Left Arm, Patient Position: Sitting, Cuff Size: Normal)   Pulse 68   Temp 97.8 F (36.6 C) (Oral)   Resp 14   Ht 5\' 1"  (1.549 m)   Wt 155 lb (70.3 kg)   BMI 29.29 kg/m   Physical Exam  Constitutional: She is oriented to person, place, and time. She appears well-developed and well-nourished.  HENT:  Head: Normocephalic and atraumatic.  Eyes: Conjunctivae are normal. No scleral icterus.  Neck: No thyromegaly present.  Cardiovascular: Normal rate, regular rhythm and normal  heart sounds.  Pulmonary/Chest: Effort normal and breath sounds normal.  Abdominal: Soft.  Neurological: She is alert and oriented to person, place, and time.  Skin: Skin is warm and dry.  Psychiatric: She has a normal mood and affect. Her behavior is normal. Judgment and thought content normal.    Activities of Daily Living In your present state of health, do you have any difficulty performing the following activities: 12/14/2016 03/10/2016  Hearing? N N  Vision? N N  Difficulty concentrating or making decisions? N N  Walking or climbing stairs? N N  Dressing or bathing? N N  Doing errands, shopping? N -  Some recent data might be hidden    Fall Risk Assessment Fall Risk  12/14/2016 11/04/2015 05/04/2015 11/04/2014  Falls in the past year? No Yes No No     Depression Screen PHQ 2/9 Scores 12/14/2016 11/04/2015 05/04/2015 11/04/2014  PHQ - 2 Score 0 0 1 0    Cognitive Testing - 6-CIT  Correct? Score   What year is it? yes 0 0 or 4  What month is it? yes 0 0 or 3  Memorize:    [name],  44,  [address],  [place],      What time is it? (within 1 hour) yes 0 0 or 3  Count backwards from 20 yes 0 0, 2, or 4  Name the months of the year yes 0 0, 2, or 4  Repeat name & address above no 10 0, 2, 4, 6, 8, or 10       TOTAL SCORE  10/28   Interpretation:  Abnormal- 10  Normal (0-7) Abnormal (8-28)       Assessment & Plan:     Annual Wellness Visit  Reviewed patient's Family Medical History Reviewed and updated list of patient's medical providers Assessment of cognitive impairment was done Assessed patient's functional ability Established a written schedule for health screening [place] Completed and Reviewed  Exercise Activities and Dietary recommendations Goals    None      Immunization History  Administered Date(s) Administered  . Influenza, High Dose Seasonal PF 11/04/2014, 11/04/2015, 11/22/2016  . Pneumococcal Conjugate-13 08/04/2013  .  Pneumococcal Polysaccharide-23 05/02/2009  . Td 09/10/2007    Health Maintenance  Topic Date Due  . TETANUS/TDAP  09/09/2017  . INFLUENZA VACCINE  Completed  . DEXA SCAN  Completed  . PNA vac Low Risk Adult  Completed     Discussed health benefits of physical activity, and encouraged her to engage in regular exercise appropriate for her age and condition.    ------------------------------------------------------------------------------------------------------------ 1. Medicare annual wellness visit, subsequent   2. Cough Follow up 1 month. If no better will treat and/spirometry.  - DG Chest 2 View; Future   I have done the exam and reviewed the above chart and it is accurate to the best of my knowledge. Development worker, community has been used in this note in any air is in the dictation or transcription are unintentional.  [name], MD  [place]

## 2016-12-14 NOTE — Telephone Encounter (Signed)
Pt informed

## 2017-01-01 DIAGNOSIS — E7801 Familial hypercholesterolemia: Secondary | ICD-10-CM | POA: Diagnosis not present

## 2017-01-01 DIAGNOSIS — G4733 Obstructive sleep apnea (adult) (pediatric): Secondary | ICD-10-CM | POA: Diagnosis not present

## 2017-01-01 DIAGNOSIS — I10 Essential (primary) hypertension: Secondary | ICD-10-CM | POA: Diagnosis not present

## 2017-01-07 ENCOUNTER — Emergency Department: Payer: Medicare Other

## 2017-01-07 ENCOUNTER — Observation Stay
Admission: EM | Admit: 2017-01-07 | Discharge: 2017-01-08 | Disposition: A | Discharge status: Home / Self Care | Payer: Medicare Other | Attending: Internal Medicine | Admitting: Internal Medicine

## 2017-01-07 ENCOUNTER — Encounter: Payer: Self-pay | Admitting: Emergency Medicine

## 2017-01-07 DIAGNOSIS — Z7951 Long term (current) use of inhaled steroids: Secondary | ICD-10-CM | POA: Diagnosis not present

## 2017-01-07 DIAGNOSIS — F329 Major depressive disorder, single episode, unspecified: Secondary | ICD-10-CM | POA: Insufficient documentation

## 2017-01-07 DIAGNOSIS — N390 Urinary tract infection, site not specified: Secondary | ICD-10-CM | POA: Diagnosis present

## 2017-01-07 DIAGNOSIS — J449 Chronic obstructive pulmonary disease, unspecified: Secondary | ICD-10-CM | POA: Diagnosis present

## 2017-01-07 DIAGNOSIS — K219 Gastro-esophageal reflux disease without esophagitis: Secondary | ICD-10-CM | POA: Diagnosis present

## 2017-01-07 DIAGNOSIS — E7800 Pure hypercholesterolemia, unspecified: Secondary | ICD-10-CM | POA: Insufficient documentation

## 2017-01-07 DIAGNOSIS — I10 Essential (primary) hypertension: Secondary | ICD-10-CM | POA: Diagnosis not present

## 2017-01-07 DIAGNOSIS — F411 Generalized anxiety disorder: Secondary | ICD-10-CM | POA: Diagnosis not present

## 2017-01-07 DIAGNOSIS — R55 Syncope and collapse: Principal | ICD-10-CM | POA: Diagnosis present

## 2017-01-07 DIAGNOSIS — R1111 Vomiting without nausea: Secondary | ICD-10-CM | POA: Diagnosis not present

## 2017-01-07 HISTORY — DX: Pure hypercholesterolemia, unspecified: E78.00

## 2017-01-07 LAB — URINALYSIS, COMPLETE (UACMP) WITH MICROSCOPIC
BACTERIA UA: NONE SEEN
BILIRUBIN URINE: NEGATIVE
Glucose, UA: NEGATIVE mg/dL
Hgb urine dipstick: NEGATIVE
KETONES UR: NEGATIVE mg/dL
Nitrite: NEGATIVE
PH: 5 (ref 5.0–8.0)
Protein, ur: NEGATIVE mg/dL
SPECIFIC GRAVITY, URINE: 1.018 (ref 1.005–1.030)

## 2017-01-07 LAB — COMPREHENSIVE METABOLIC PANEL
ALBUMIN: 3.3 g/dL — AB (ref 3.5–5.0)
ALK PHOS: 83 U/L (ref 38–126)
ALT: 14 U/L (ref 14–54)
ANION GAP: 8 (ref 5–15)
AST: 23 U/L (ref 15–41)
BUN: 22 mg/dL — AB (ref 6–20)
CALCIUM: 8.9 mg/dL (ref 8.9–10.3)
CO2: 23 mmol/L (ref 22–32)
Chloride: 107 mmol/L (ref 101–111)
Creatinine, Ser: 1.02 mg/dL — ABNORMAL HIGH (ref 0.44–1.00)
GFR calc Af Amer: 57 mL/min — ABNORMAL LOW (ref >60)
GFR calc non Af Amer: 49 mL/min — ABNORMAL LOW (ref >60)
GLUCOSE: 223 mg/dL — AB (ref 65–99)
Potassium: 3.9 mmol/L (ref 3.5–5.1)
SODIUM: 138 mmol/L (ref 135–145)
TOTAL PROTEIN: 6.2 g/dL — AB (ref 6.5–8.1)

## 2017-01-07 LAB — CBC
HEMATOCRIT: 34.2 % — AB (ref 35.0–47.0)
HEMOGLOBIN: 11.3 g/dL — AB (ref 12.0–16.0)
MCH: 29.4 pg (ref 26.0–34.0)
MCHC: 33 g/dL (ref 32.0–36.0)
MCV: 89.1 fL (ref 80.0–100.0)
Platelets: 314 10*3/uL (ref 150–440)
RBC: 3.84 MIL/uL (ref 3.80–5.20)
RDW: 13.9 % (ref 11.5–14.5)
WBC: 8.2 10*3/uL (ref 3.6–11.0)

## 2017-01-07 LAB — TROPONIN I: Troponin I: <0.03 ng/mL (ref <0.03)

## 2017-01-07 MED ORDER — CEFTRIAXONE SODIUM IN DEXTROSE 20 MG/ML IV SOLN
1.0000 g | Freq: Once | INTRAVENOUS | Status: AC
  Administered 2017-01-07: 1 g via INTRAVENOUS
  Filled 2017-01-07: qty 50

## 2017-01-07 MED ORDER — SODIUM CHLORIDE 0.9 % IV BOLUS (SEPSIS)
500.0000 mL | Freq: Once | INTRAVENOUS | Status: AC
Start: 2017-01-07 — End: 2017-01-07
  Administered 2017-01-07: 500 mL via INTRAVENOUS

## 2017-01-07 NOTE — ED Triage Notes (Signed)
Pt arrives ems with fsbs per ems of 120. Ems states pt with syncope x2 after large meal and emesis this pm. Ems states pt was at church after eating when she felt weak and vomited followed by syncope. Ems states fire arrived and witnessed a second syncopal episode. Pt states she continues to feel weak, but denies pain. 535mL ns bolus given by ems.

## 2017-01-07 NOTE — ED Notes (Signed)
Pt up to commode for bowel movement and urination. Pt assisted back to bed.

## 2017-01-07 NOTE — ED Provider Notes (Signed)
[facility] Emergency Department Provider Note ____________________________________________   First MD Initiated Contact with Patient 01/07/17 2050     (approximate)  I have reviewed the triage vital signs and the nursing notes.   HISTORY  Chief Complaint Loss of Consciousness    HPI Kathy Pacheco is a 83 y.o. female with past medical history as noted below who presents with syncope, acute onset short time after she ate a large meal, preceded by one episode of vomiting, and several minutes of nausea, lightheadedness, and weakness, and now resolved.  Patient states that similar episodes have happened to her before after eating a large meal.  She states that she was at a social event at her church and ate much more than she usually does.  She states at home she usually eats very small amounts at a time.  EMS is stated that patient had further lightheadedness and a second brief episode of syncope when they arrived.  Patient denies any head trauma or other injury.  She states that right now she feels tired and fatigued, but otherwise denies acute symptoms.  She denies any recent illness and states that she was at her baseline until this evening.  Past Medical History:  Diagnosis Date  . Anxiety   . GERD (gastroesophageal reflux disease)   . High cholesterol   . Hypercholesteremia   . Hypertension   . PONV (postoperative nausea and vomiting)     Patient Active Problem List   Diagnosis Date Noted  . Absolute anemia 11/03/2014  . Anxiety 11/03/2014  . Bilateral cataracts 11/03/2014  . COPD, mild ([place]) 11/03/2014  . Acute cystitis 11/03/2014  . 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 Esophagitis, reflux 11/03/2014  . Anxiety, generalized 11/03/2014  . Acid reflux 11/03/2014  . Hypercholesteremia 11/03/2014  . HLD (hyperlipidemia) 11/03/2014  . BP (high blood pressure) 11/03/2014  . Affective disorder, major 11/03/2014  . Menopausal and perimenopausal  disorder 11/03/2014  . Obstructive apnea 11/03/2014  . Awareness of heartbeats 11/03/2014  . Solitary pulmonary nodule 11/03/2014  . Hypercholesterolemia without hypertriglyceridemia 11/03/2014  . Allergic rhinitis, seasonal 11/03/2014  . H/O cataract extraction 11/03/2014  . Apnea, sleep 12/16/2013    Past Surgical History:  Procedure Laterality Date  . CATARACT EXTRACTION Bilateral   . ETHMOIDECTOMY Bilateral 03/10/2016   Procedure: ETHMOIDECTOMY;  Surgeon: Beverly Gust, MD;  Location: [place];  Service: ENT;  Laterality: Bilateral;  . FRONTAL SINUS EXPLORATION Bilateral 03/10/2016   Procedure: FRONTAL WITH TISSUE REMOVAL;  Surgeon: Beverly Gust, MD;  Location: [place];  Service: ENT;  Laterality: Bilateral;  . HERNIA REPAIR    . IMAGE GUIDED SINUS SURGERY Bilateral 03/10/2016   Procedure: IMAGE GUIDED SINUS SURGERY;  Surgeon: Beverly Gust, MD;  Location: [place];  Service: ENT;  Laterality: Bilateral;  Need Disk GAVE DISK TO CECE 1-25 KP  . MAXILLARY ANTROSTOMY Bilateral 03/10/2016   Procedure: MAXILLARY ANTROSTOMY;  Surgeon: Beverly Gust, MD;  Location: [place];  Service: ENT;  Laterality: Bilateral;  . NASAL TURBINATE REDUCTION Bilateral 03/10/2016   Procedure: TURBINATE REDUCTION/SUBMUCOSAL RESECTION;  Surgeon: Beverly Gust, MD;  Location: [place];  Service: ENT;  Laterality: Bilateral;  . SEPTOPLASTY Bilateral 03/10/2016   Procedure: SEPTOPLASTY;  Surgeon: Beverly Gust, MD;  Location: [place];  Service: ENT;  Laterality: Bilateral;  . SPHENOIDECTOMY Bilateral 03/10/2016   Procedure: SPHENOIDECTOMY WITH TISSUE REMOVAL;  Surgeon: Beverly Gust, MD;  Location: [place];  Service: ENT;  Laterality: Bilateral;    Prior to Admission medications   Medication Sig Start Date End Date Taking? Authorizing Provider  acetaminophen (TYLENOL) 500 MG tablet Take 500 mg by mouth every 6 (six) hours as  needed for mild pain.    Yes [provider]  albuterol (PROVENTIL HFA;VENTOLIN HFA) 108 (90 Base) MCG/ACT inhaler Inhale into the lungs every 6 (six) hours as needed for wheezing or shortness of breath.   Yes [provider]  amLODipine (NORVASC) 5 MG tablet TAKE 1 TABLET (5 MG TOTAL) BY MOUTH DAILY. 11/01/16  Yes Jerrol Banana., MD  busPIRone (BUSPAR) 10 MG tablet TAKE 1 TABLET (10 MG TOTAL) BY MOUTH 2 (TWO) TIMES DAILY. 08/04/16  Yes Jerrol Banana., MD  calcium carbonate (OS-CAL) 600 MG TABS tablet Take 600 mg by mouth daily with breakfast.    Yes [provider]  citalopram (CELEXA) 10 MG tablet TAKE 1 TABLET (10 MG TOTAL) BY MOUTH DAILY. 11/22/16  Yes Jerrol Banana., MD  fluticasone [facility]) 50 MCG/ACT nasal spray 2 SPRAYS EACH NOSTRIL TWICE DAILY 07/04/16  Yes [provider]  Fluticasone-Salmeterol (ADVAIR DISKUS) 500-50 MCG/DOSE AEPB Inhale 1 puff into the lungs 2 (two) times daily.  04/27/14  Yes [provider]  omeprazole (PRILOSEC) 20 MG capsule TAKE 1 CAPSULE (20 MG TOTAL) BY MOUTH DAILY. 08/04/16  Yes Jerrol Banana., MD  rosuvastatin (CRESTOR) 5 MG tablet Take 1 tablet (5 mg total) daily by mouth. 12/14/16  Yes Jerrol Banana., MD  azithromycin (ZITHROMAX Z-PAK) 250 MG tablet Take 2 tablets on the first day then 1 daily until finished Patient not taking: Reported on 01/07/2017 12/14/16   Jerrol Banana., MD  loratadine (CLARITIN) 10 MG tablet TAKE 1 TABLET BY MOUTH EVERY DAY Patient not taking: Reported on 12/14/2016 05/15/15   Jerrol Banana., MD    Allergies Ciprofloxacin  Family History  Problem Relation Age of Onset  . Epilepsy Mother   . Osteoporosis Mother   . COPD Father   . Hypertension Father   . CVA Father   . Allergies Father   . Lung cancer Father   . Emphysema Father   . Cancer Sister        breast  . Breast cancer Sister 66  . CVA Brother   . COPD Brother   . Heart  disease Brother   . Arthritis Brother   . CVA Brother   . Cancer Brother        cancer  . Healthy Brother   . Cancer Maternal Grandmother   . Hypertension Maternal Grandmother   . Healthy Brother   . Osteoporosis Sister   . COPD Sister     Social History Social History   Tobacco Use  . Smoking status: Never Smoker  . Smokeless tobacco: Never Used  Substance Use Topics  . Alcohol use: No    Alcohol/week: 0.0 oz  . Drug use: No    Review of Systems  Constitutional: No fever.  Eyes: No visual changes. ENT: No sore throat. Cardiovascular: Denies chest pain. Respiratory: Denies shortness of breath. Gastrointestinal: Positive for resolved vomiting. Genitourinary: Negative for dysuria.  Musculoskeletal: Negative for back pain. Skin: Negative for rash. Neurological: Negative for headache.    ____________________________________________   PHYSICAL EXAM:  VITAL SIGNS: ED Triage Vitals [01/07/17 2050]  Enc Vitals Group     BP (!) 151/71     Pulse Rate 81  Resp 16     Temp 98.1 F (36.7 C)     Temp Source Oral     SpO2 96 %     Weight 147 lb (66.7 kg)     Height 5\' 2"  (1.575 m)     Head Circumference      Peak Flow      Pain Score      Pain Loc      Pain Edu?      Excl. in [place]?     Constitutional: Alert and oriented. Well appearing and in no acute distress. Eyes: Conjunctivae are normal.  EOMI.  PERRLA. Head: Atraumatic. Nose: No congestion/rhinnorhea. Mouth/Throat: Mucous membranes are somewhat dry.   Neck: Normal range of motion.  Cardiovascular: Normal rate, regular rhythm. Grossly normal heart sounds.  Good peripheral circulation. Respiratory: Normal respiratory effort.  No retractions. Lungs CTAB. Gastrointestinal: Soft and nontender. No distention.  Genitourinary: No CVA tenderness. Musculoskeletal: No lower extremity edema.  Extremities warm and well perfused.  Neurologic:  Normal speech and language. No gross focal neurologic deficits are  appreciated.  Motor and sensory intact in all extremities.  Normal coordination. Skin:  Skin is warm and dry. No rash noted. Psychiatric: Mood and affect are normal. Speech and behavior are normal.  ____________________________________________   LABS (all labs ordered are listed, but only abnormal results are displayed)  Labs Reviewed  CBC - Abnormal; Notable for the following components:      Result Value   Hemoglobin 11.3 (*)    HCT 34.2 (*)    All other components within normal limits  COMPREHENSIVE METABOLIC PANEL - Abnormal; Notable for the following components:   Glucose, Bld 223 (*)    BUN 22 (*)    Creatinine, Ser 1.02 (*)    Total Protein 6.2 (*)    Albumin 3.3 (*)    Total Bilirubin <0.1 (*)    GFR calc non Af Amer 49 (*)    GFR calc Af Amer 57 (*)    All other components within normal limits  URINALYSIS, COMPLETE (UACMP) WITH MICROSCOPIC - Abnormal; Notable for the following components:   Color, Urine YELLOW (*)    APPearance CLEAR (*)    Leukocytes, UA TRACE (*)    Squamous Epithelial / LPF 0-5 (*)    All other components within normal limits  TROPONIN I   ____________________________________________  EKG  ED ECG REPORT I, Arta Silence, the attending physician, personally viewed and interpreted this ECG.  Date: 01/07/2017 EKG Time: 2044 Rate: 82 Rhythm: normal sinus rhythm QRS Axis: normal Intervals: normal ST/T Wave abnormalities: normal Narrative Interpretation: no evidence of acute ischemia; no significant change when compared to EKG of 05/29/2013  ____________________________________________  RADIOLOGY  CXR: no focal infiltrate   ____________________________________________   PROCEDURES  Procedure(s) performed: No    Critical Care performed: No ____________________________________________   INITIAL IMPRESSION / ASSESSMENT AND PLAN / ED COURSE  Pertinent labs & imaging results that were available during my care of the patient  were reviewed by me and considered in my medical decision making (see chart for details).  83 year old female with past medical history as noted above presents with syncope after eating a large meal and having nausea and vomiting.  Per EMS patient had a second brief episode of syncope in their presence.  Patient is now awake and alert, but feels somewhat fatigued.  On exam, vital signs are normal except for slight hypertension, and mucous membranes are slightly dry, but the remainder the  exam is unremarkable.  Neuro exam is nonfocal, and there are no other concerning findings.  Patient states she has had similar episodes in the past when eating large amounts.  Presentation overall consistent with a vasovagal syncope likely postprandial or related to the nausea and vomiting.  Given the clear prodrome of lightheadedness and nausea, this is less concerning for a cardiac cause.  Patient has no shortness of breath, palpitations, or other symptoms suggestive of cardiac etiology.  Plan: Basic labs, troponin x1, gentle IV hydration, chest x-ray, and reassess.  We will also obtain a UA to rule out UTI as a possible predisposing cause.  If workup is negative and patient is feeling better, anticipate possible discharge home.    ----------------------------------------- 11:07 PM on 01/07/2017 -----------------------------------------  Lab workup reveals UTI.  Patient has stable vital signs, when she gets up she still feels very lightheaded and orthostatic.  She and her daughter feel like it would not be safe for her to go home at this time.  IV antibiotic ordered, and I have signed out the patient to the hospitalist Dr. Jannifer Franklin.  ____________________________________________   FINAL CLINICAL IMPRESSION(S) / ED DIAGNOSES  Final diagnoses:  Vasovagal syncope  Urinary tract infection without hematuria, site unspecified      NEW MEDICATIONS STARTED DURING THIS VISIT:  This SmartLink is deprecated. Use  AVSMEDLIST instead to display the medication list for a patient.   Note:  This document was prepared using Dragon voice recognition software and may include unintentional dictation errors.    Arta Silence, MD 01/07/17 [phone]

## 2017-01-07 NOTE — ED Notes (Signed)
Explanation of admission process provided to pt who verbalizes understanding.  

## 2017-01-07 NOTE — ED Notes (Signed)
Bra removed and pt prepped for xray.

## 2017-01-07 NOTE — H&P (Signed)
[place] [facility] [place] NAME: Kathy Pacheco    MR#:  [id number]  DATE OF BIRTH:  October 04, 1931  DATE OF ADMISSION:  01/07/2017  PRIMARY CARE PHYSICIAN: Jerrol Banana., MD   REQUESTING/REFERRING PHYSICIAN: Cherylann Banas, MD  CHIEF COMPLAINT:   Chief Complaint  Patient presents with  . Loss of Consciousness    HISTORY OF PRESENT ILLNESS:  Kathy Pacheco  is a 83 y.o. female who presents with syncopal episode.  Patient states that she was at a church dinner tonight and ate quite a bit of food, she got nauseated and vomited and then fainted.  She states this has happened to her before in that order, though not often.  Here in the ED she was also found to have likely UTI, and has complained of some urinary frequency over the last week or so.  Other workup appears largely within normal limits.  Hospitalist were called for admission  PAST MEDICAL HISTORY:   Past Medical History:  Diagnosis Date  . Anxiety   . GERD (gastroesophageal reflux disease)   . High cholesterol   . Hypercholesteremia   . Hypertension   . PONV (postoperative nausea and vomiting)     PAST SURGICAL HISTORY:   Past Surgical History:  Procedure Laterality Date  . CATARACT EXTRACTION Bilateral   . ETHMOIDECTOMY Bilateral 03/10/2016   Procedure: ETHMOIDECTOMY;  Surgeon: Beverly Gust, MD;  Location: [place] [facility];  Service: ENT;  Laterality: Bilateral;  . FRONTAL SINUS EXPLORATION Bilateral 03/10/2016   Procedure: FRONTAL WITH TISSUE REMOVAL;  Surgeon: Beverly Gust, MD;  Location: [place];  Service: ENT;  Laterality: Bilateral;  . HERNIA REPAIR    . IMAGE GUIDED SINUS SURGERY Bilateral 03/10/2016   Procedure: IMAGE GUIDED SINUS SURGERY;  Surgeon: Beverly Gust, MD;  Location: [place];  Service: ENT;  Laterality: Bilateral;  Need Disk GAVE DISK TO CECE 1-25 KP  . MAXILLARY ANTROSTOMY Bilateral 03/10/2016   Procedure: MAXILLARY  ANTROSTOMY;  Surgeon: Beverly Gust, MD;  Location: [place];  Service: ENT;  Laterality: Bilateral;  . NASAL TURBINATE REDUCTION Bilateral 03/10/2016   Procedure: TURBINATE REDUCTION/SUBMUCOSAL RESECTION;  Surgeon: Beverly Gust, MD;  Location: [place];  Service: ENT;  Laterality: Bilateral;  . SEPTOPLASTY Bilateral 03/10/2016   Procedure: SEPTOPLASTY;  Surgeon: Beverly Gust, MD;  Location: [facility];  Service: ENT;  Laterality: Bilateral;  . SPHENOIDECTOMY Bilateral 03/10/2016   Procedure: SPHENOIDECTOMY WITH TISSUE REMOVAL;  Surgeon: Beverly Gust, MD;  Location: [place];  Service: ENT;  Laterality: Bilateral;    SOCIAL HISTORY:   Social History   Tobacco Use  . Smoking status: Never Smoker  . Smokeless tobacco: Never Used  Substance Use Topics  . Alcohol use: No    Alcohol/week: 0.0 oz    FAMILY HISTORY:   Family History  Problem Relation Age of Onset  . Epilepsy Mother   . Osteoporosis Mother   . COPD Father   . Hypertension Father   . CVA Father   . Allergies Father   . Lung cancer Father   . Emphysema Father   . Cancer Sister        breast  . Breast cancer Sister 14  . CVA Brother   . COPD Brother   . Heart disease Brother   . Arthritis Brother   . CVA Brother   . Cancer Brother        cancer  . Healthy Brother   .  Cancer Maternal Grandmother   . Hypertension Maternal Grandmother   . Healthy Brother   . Osteoporosis Sister   . COPD Sister     DRUG ALLERGIES:   Allergies  Allergen Reactions  . Ciprofloxacin Itching    MEDICATIONS AT HOME:   Prior to Admission medications   Medication Sig Start Date End Date Taking? Authorizing Provider  acetaminophen (TYLENOL) 500 MG tablet Take 500 mg by mouth every 6 (six) hours as needed for mild pain.    Yes [provider]  albuterol (PROVENTIL HFA;VENTOLIN HFA) 108 (90 Base) MCG/ACT inhaler Inhale into the lungs every 6 (six) hours as needed for  wheezing or shortness of breath.   Yes [provider]  amLODipine (NORVASC) 5 MG tablet TAKE 1 TABLET (5 MG TOTAL) BY MOUTH DAILY. 11/01/16  Yes Jerrol Banana., MD  busPIRone (BUSPAR) 10 MG tablet TAKE 1 TABLET (10 MG TOTAL) BY MOUTH 2 (TWO) TIMES DAILY. 08/04/16  Yes Jerrol Banana., MD  calcium carbonate (OS-CAL) 600 MG TABS tablet Take 600 mg by mouth daily with breakfast.    Yes [provider]  citalopram (CELEXA) 10 MG tablet TAKE 1 TABLET (10 MG TOTAL) BY MOUTH DAILY. 11/22/16  Yes Jerrol Banana., MD  fluticasone [facility]) 50 MCG/ACT nasal spray 2 SPRAYS EACH NOSTRIL TWICE DAILY 07/04/16  Yes [provider]  Fluticasone-Salmeterol (ADVAIR DISKUS) 500-50 MCG/DOSE AEPB Inhale 1 puff into the lungs 2 (two) times daily.  04/27/14  Yes [provider]  omeprazole (PRILOSEC) 20 MG capsule TAKE 1 CAPSULE (20 MG TOTAL) BY MOUTH DAILY. 08/04/16  Yes Jerrol Banana., MD  rosuvastatin (CRESTOR) 5 MG tablet Take 1 tablet (5 mg total) daily by mouth. 12/14/16  Yes Jerrol Banana., MD  azithromycin (ZITHROMAX Z-PAK) 250 MG tablet Take 2 tablets on the first day then 1 daily until finished Patient not taking: Reported on 01/07/2017 12/14/16   Jerrol Banana., MD  loratadine (CLARITIN) 10 MG tablet TAKE 1 TABLET BY MOUTH EVERY DAY Patient not taking: Reported on 12/14/2016 05/15/15   Jerrol Banana., MD    REVIEW OF SYSTEMS:  Review of Systems  Constitutional: Negative for chills, fever, malaise/fatigue and weight loss.  HENT: Negative for ear pain, hearing loss and tinnitus.   Eyes: Negative for blurred vision, double vision, pain and redness.  Respiratory: Negative for cough, hemoptysis and shortness of breath.   Cardiovascular: Negative for chest pain, palpitations, orthopnea and leg swelling.  Gastrointestinal: Positive for nausea and vomiting. Negative for abdominal pain, constipation and diarrhea.  Genitourinary:  Positive for frequency. Negative for dysuria and hematuria.  Musculoskeletal: Negative for back pain, joint pain and neck pain.  Skin:       No acne, rash, or lesions  Neurological: Positive for loss of consciousness. Negative for dizziness, tremors, focal weakness and weakness.  Endo/Heme/Allergies: Negative for polydipsia. Does not bruise/bleed easily.  Psychiatric/Behavioral: Negative for depression. The patient is not nervous/anxious and does not have insomnia.      VITAL SIGNS:   Vitals:   01/07/17 2200 01/07/17 2230 01/07/17 2300 01/07/17 2330  BP: (!) 153/65 (!) 166/59 (!) 160/64 (!) 156/71  Pulse: 87 87 84 78  Resp: (!) 38 (!) 23 (!) 21 18  Temp:      TempSrc:      SpO2: 97% 95% 94% 94%  Weight:      Height:       Wt Readings from Last  3 Encounters:  01/07/17 66.7 kg (147 lb)  12/14/16 70.3 kg (155 lb)  08/28/16 68.8 kg (151 lb 9.6 oz)    PHYSICAL EXAMINATION:  Physical Exam  Vitals reviewed. Constitutional: She is oriented to person, place, and time. She appears well-developed and well-nourished. No distress.  HENT:  Head: Normocephalic and atraumatic.  Mouth/Throat: Oropharynx is clear and moist.  Eyes: Conjunctivae and EOM are normal. Pupils are equal, round, and reactive to light. No scleral icterus.  Neck: Normal range of motion. Neck supple. No JVD present. No thyromegaly present.  Cardiovascular: Normal rate, regular rhythm and intact distal pulses. Exam reveals no gallop and no friction rub.  No murmur heard. Respiratory: Effort normal and breath sounds normal. No respiratory distress. She has no wheezes. She has no rales.  GI: Soft. Bowel sounds are normal. She exhibits no distension. There is no tenderness.  Musculoskeletal: Normal range of motion. She exhibits no edema.  No arthritis, no gout  Lymphadenopathy:    She has no cervical adenopathy.  Neurological: She is alert and oriented to person, place, and time. No cranial nerve deficit.  No  dysarthria, no aphasia  Skin: Skin is warm and dry. No rash noted. No erythema.  Psychiatric: She has a normal mood and affect. Her behavior is normal. Judgment and thought content normal.    LABORATORY PANEL:   CBC Recent Labs  Lab 01/07/17 2054  WBC 8.2  HGB 11.3*  HCT 34.2*  PLT 314   ------------------------------------------------------------------------------------------------------------------  Chemistries  Recent Labs  Lab 01/07/17 2054  NA 138  K 3.9  CL 107  CO2 23  GLUCOSE 223*  BUN 22*  CREATININE 1.02*  CALCIUM 8.9  AST 23  ALT 14  ALKPHOS 83  BILITOT <0.1*   ------------------------------------------------------------------------------------------------------------------  Cardiac Enzymes Recent Labs  Lab 01/07/17 2054  TROPONINI <0.03   ------------------------------------------------------------------------------------------------------------------  RADIOLOGY:  Dg Chest 2 View  Result Date: 01/07/2017 CLINICAL DATA:  Syncopal episodes. EXAM: CHEST  2 VIEW COMPARISON:  12/14/2016 FINDINGS: Cardiomediastinal silhouette is normal. Mediastinal contours appear intact. Calcific atherosclerotic disease and tortuosity of the aorta. There is no evidence of focal airspace consolidation, pleural effusion or pneumothorax. Osseous structures are without acute abnormality. Soft tissues are grossly normal. IMPRESSION: No active cardiopulmonary disease. Calcific atherosclerotic disease and tortuosity of the aorta. Electronically Signed   By: Fidela Salisbury M.D.   On: 01/07/2017 22:27    EKG:   Orders placed or performed during the hospital encounter of 01/07/17  . ED EKG  . ED EKG    IMPRESSION AND PLAN:  Principal Problem:   Syncope -unclear etiology, though it does seem per description to have possibly been a vagal episode.  We will get an echocardiogram and a cardiology consult Active Problems:   UTI (urinary tract infection) -IV antibiotics,  culture sent   COPD, mild (HCC) -dose inhalers   BP (high blood pressure) -continue home meds   Anxiety, generalized -home dose anxiolytics   Acid reflux -home dose PPI  All the records are reviewed and case discussed with ED provider. Management plans discussed with the patient and/or family.  DVT PROPHYLAXIS: SubQ lovenox  GI PROPHYLAXIS: PPI  ADMISSION STATUS: Inpatient  CODE STATUS: Full Code Status History    This patient does not have a recorded code status. Please follow your organizational policy for patients in this situation.      TOTAL TIME TAKING CARE OF THIS PATIENT: 45 minutes.   Panfilo Ketchum FIELDING 01/07/2017, 11:53 PM  [facility]  Office  [phone]  CC: Primary care physician; Jerrol Banana., MD  Note:  This document was prepared using Dragon voice recognition software and may include unintentional dictation errors.

## 2017-01-08 ENCOUNTER — Other Ambulatory Visit: Payer: Self-pay

## 2017-01-08 ENCOUNTER — Inpatient Hospital Stay
Admit: 2017-01-08 | Discharge: 2017-01-08 | Disposition: A | Discharge status: Home / Self Care | Payer: Medicare Other | Attending: Internal Medicine | Admitting: Internal Medicine

## 2017-01-08 DIAGNOSIS — I10 Essential (primary) hypertension: Secondary | ICD-10-CM | POA: Diagnosis not present

## 2017-01-08 DIAGNOSIS — J449 Chronic obstructive pulmonary disease, unspecified: Secondary | ICD-10-CM | POA: Diagnosis not present

## 2017-01-08 DIAGNOSIS — R55 Syncope and collapse: Secondary | ICD-10-CM | POA: Diagnosis not present

## 2017-01-08 DIAGNOSIS — F419 Anxiety disorder, unspecified: Secondary | ICD-10-CM | POA: Diagnosis not present

## 2017-01-08 LAB — TROPONIN I

## 2017-01-08 LAB — CBC
HCT: 35.4 % (ref 35.0–47.0)
Hemoglobin: 11.8 g/dL — ABNORMAL LOW (ref 12.0–16.0)
MCH: 29.1 pg (ref 26.0–34.0)
MCHC: 33.4 g/dL (ref 32.0–36.0)
MCV: 87.3 fL (ref 80.0–100.0)
Platelets: 305 10*3/uL (ref 150–440)
RBC: 4.05 MIL/uL (ref 3.80–5.20)
RDW: 13.7 % (ref 11.5–14.5)
WBC: 6.6 10*3/uL (ref 3.6–11.0)

## 2017-01-08 LAB — BASIC METABOLIC PANEL
Anion gap: 7 (ref 5–15)
BUN: 15 mg/dL (ref 6–20)
CALCIUM: 9.1 mg/dL (ref 8.9–10.3)
CO2: 24 mmol/L (ref 22–32)
CREATININE: 0.76 mg/dL (ref 0.44–1.00)
Chloride: 107 mmol/L (ref 101–111)
GFR calc Af Amer: >60 mL/min (ref >60)
GLUCOSE: 96 mg/dL (ref 65–99)
Potassium: 4 mmol/L (ref 3.5–5.1)
Sodium: 138 mmol/L (ref 135–145)

## 2017-01-08 LAB — ECHOCARDIOGRAM COMPLETE
HEIGHTINCHES: 62 in
WEIGHTICAEL: 2420.8 [oz_av]

## 2017-01-08 MED ORDER — PANTOPRAZOLE SODIUM 40 MG PO TBEC
40.0000 mg | DELAYED_RELEASE_TABLET | Freq: Every day | ORAL | Status: DC
  Administered 2017-01-08: 40 mg via ORAL
  Filled 2017-01-08: qty 1

## 2017-01-08 MED ORDER — ENOXAPARIN SODIUM 40 MG/0.4ML ~~LOC~~ SOLN: 40.0000 mg | SUBCUTANEOUS | Status: DC

## 2017-01-08 MED ORDER — CITALOPRAM HYDROBROMIDE 20 MG PO TABS
10.0000 mg | ORAL_TABLET | Freq: Every day | ORAL | Status: DC
  Administered 2017-01-08: 10 mg via ORAL
  Filled 2017-01-08: qty 1

## 2017-01-08 MED ORDER — ACETAMINOPHEN 650 MG RE SUPP: 650.0000 mg | Freq: Four times a day (QID) | RECTAL | Status: DC | PRN

## 2017-01-08 MED ORDER — ONDANSETRON HCL 4 MG/2ML IJ SOLN: 4.0000 mg | Freq: Four times a day (QID) | INTRAMUSCULAR | Status: DC | PRN

## 2017-01-08 MED ORDER — BUSPIRONE HCL 5 MG PO TABS
10.0000 mg | ORAL_TABLET | Freq: Two times a day (BID) | ORAL | Status: DC
  Administered 2017-01-08 (×2): 10 mg via ORAL
  Filled 2017-01-08 (×4): qty 2

## 2017-01-08 MED ORDER — CEPHALEXIN 250 MG PO CAPS: 250.0000 mg | ORAL_CAPSULE | Freq: Three times a day (TID) | ORAL | 0 refills | Status: AC

## 2017-01-08 MED ORDER — ACETAMINOPHEN 325 MG PO TABS: 650.0000 mg | ORAL_TABLET | Freq: Four times a day (QID) | ORAL | Status: DC | PRN

## 2017-01-08 MED ORDER — MOMETASONE FURO-FORMOTEROL FUM 200-5 MCG/ACT IN AERO
2.0000 | INHALATION_SPRAY | Freq: Two times a day (BID) | RESPIRATORY_TRACT | Status: DC
  Administered 2017-01-08: 2 via RESPIRATORY_TRACT
  Filled 2017-01-08: qty 8.8

## 2017-01-08 MED ORDER — ONDANSETRON HCL 4 MG PO TABS: 4.0000 mg | ORAL_TABLET | Freq: Four times a day (QID) | ORAL | Status: DC | PRN

## 2017-01-08 MED ORDER — AMLODIPINE BESYLATE 5 MG PO TABS
5.0000 mg | ORAL_TABLET | Freq: Every day | ORAL | Status: DC
  Administered 2017-01-08: 5 mg via ORAL
  Filled 2017-01-08: qty 1

## 2017-01-08 MED ORDER — ROSUVASTATIN CALCIUM 5 MG PO TABS
5.0000 mg | ORAL_TABLET | Freq: Every day | ORAL | Status: DC
  Administered 2017-01-08: 5 mg via ORAL
  Filled 2017-01-08: qty 1

## 2017-01-08 MED ORDER — CEFTRIAXONE SODIUM 2 G IJ SOLR
2.0000 g | INTRAMUSCULAR | Status: DC
  Filled 2017-01-08: qty 2

## 2017-01-08 NOTE — Progress Notes (Signed)
Pharmacy Antibiotic Note  ANONA Kathy Pacheco is a 83 y.o. female admitted on 01/07/2017 with UTI.  Pharmacy has been consulted for ceftriaxone dosing.  Plan: Ceftriaxone 2 grams q 24 hours ordered.  Height: 5\' 2"  (157.5 cm) Weight: 147 lb (66.7 kg) IBW/kg (Calculated) : 50.1  Temp (24hrs), Avg:98.1 F (36.7 C), Min:98.1 F (36.7 C), Max:98.1 F (36.7 C)  Recent Labs  Lab 01/07/17 2054  WBC 8.2  CREATININE 1.02*    Estimated Creatinine Clearance: 37.4 mL/min (A) (by C-G formula based on SCr of 1.02 mg/dL (H)).    Allergies  Allergen Reactions  . Ciprofloxacin Itching    Antimicrobials this admission: Ceftriaxone 12/2  >>    >>   Dose adjustments this admission:   Microbiology results: No micro      12/2 UA: LE (tr)  NO2(-)  WBC 6-30  Thank you for allowing pharmacy to be a part of this patient's care.  Tanuj Mullens S 01/08/2017 1:18 AM

## 2017-01-08 NOTE — Progress Notes (Signed)
Chaplain received an order to visit with pt in room 246. Chaplain provided information for an advanced directive.    01/08/17 0945  Clinical Encounter Type  Visited With Patient  Visit Type Initial  Referral From Nurse  Consult/Referral To Chaplain  Spiritual Encounters  Spiritual Needs Other (Comment)

## 2017-01-08 NOTE — Discharge Summary (Signed)
[place] at [place] NAME: Kathy Pacheco    MR#:  [id number]  DATE OF BIRTH:  01-Jan-1932  DATE OF ADMISSION:  01/07/2017   ADMITTING PHYSICIAN: Lance Coon, MD  DATE OF DISCHARGE: 01/08/2017  PRIMARY CARE PHYSICIAN: Jerrol Banana., MD   ADMISSION DIAGNOSIS:   Vasovagal syncope [R55] Urinary tract infection without hematuria, site unspecified [N39.0]  DISCHARGE DIAGNOSIS:   Principal Problem:   Syncope Active Problems:   COPD, mild (HCC)   Anxiety, generalized   Acid reflux   BP (high blood pressure)   UTI (urinary tract infection)   SECONDARY DIAGNOSIS:   Past Medical History:  Diagnosis Date  . Anxiety   . GERD (gastroesophageal reflux disease)   . High cholesterol   . Hypercholesteremia   . Hypertension   . PONV (postoperative nausea and vomiting)     HOSPITAL COURSE:   83 year old female with past medical history significant for hypertension, hyperlipidemia, GERD, anxiety presents to hospital secondary to a syncopal episode  #1 syncope-likely vasovagal syncope. -Triggered by nausea and vomiting after overeating. -Hasn't had any of those symptoms here. Monitored on telemetry with no arrhythmias. -Blood pressures have remained stable and ambulated well without symptoms. -Echocardiogram with no significant abnormalities.  #2 increased frequency of urination and dysuria-no bacteria noted on urine analysis. Received Rocephin for symptomatic cystitis. Discharge on Keflex to finish off the course.  #3 depression and anxiety-continue home medications. Patient on BuSpar and Celexa  #4 hypertension-continue Norvasc  #5 GERD-on omeprazole  Patient is stable and ambulated well. Will be discharged home   DISCHARGE CONDITIONS:   Guarded  CONSULTS OBTAINED:   None  DRUG ALLERGIES:   Allergies  Allergen Reactions  . Ciprofloxacin Itching   DISCHARGE MEDICATIONS:   Allergies as of 01/08/2017    Reactions   Ciprofloxacin Itching      Medication List    STOP taking these medications   azithromycin 250 MG tablet Commonly known as:  ZITHROMAX Z-PAK   loratadine 10 MG tablet Commonly known as:  CLARITIN     TAKE these medications   ADVAIR DISKUS 500-50 MCG/DOSE Aepb Generic drug:  Fluticasone-Salmeterol Inhale 1 puff into the lungs 2 (two) times daily.   albuterol 108 (90 Base) MCG/ACT inhaler Commonly known as:  PROVENTIL HFA;VENTOLIN HFA Inhale into the lungs every 6 (six) hours as needed for wheezing or shortness of breath.   amLODipine 5 MG tablet Commonly known as:  NORVASC TAKE 1 TABLET (5 MG TOTAL) BY MOUTH DAILY.   busPIRone 10 MG tablet Commonly known as:  BUSPAR TAKE 1 TABLET (10 MG TOTAL) BY MOUTH 2 (TWO) TIMES DAILY.   calcium carbonate 600 MG Tabs tablet Commonly known as:  OS-CAL Take 600 mg by mouth daily with breakfast.   cephALEXin 250 MG capsule Commonly known as:  KEFLEX Take 1 capsule (250 mg total) by mouth 3 (three) times daily for 5 days. X 5 days   citalopram 10 MG tablet Commonly known as:  CELEXA TAKE 1 TABLET (10 MG TOTAL) BY MOUTH DAILY.   fluticasone 50 MCG/ACT nasal spray Commonly known as:  FLONASE 2 SPRAYS EACH NOSTRIL TWICE DAILY   omeprazole 20 MG capsule Commonly known as:  PRILOSEC TAKE 1 CAPSULE (20 MG TOTAL) BY MOUTH DAILY.   rosuvastatin 5 MG tablet Commonly known as:  CRESTOR Take 1 tablet (5 mg total) daily by mouth.   TYLENOL 500 MG tablet Generic drug:  acetaminophen Take 500 mg  by mouth every 6 (six) hours as needed for mild pain.        DISCHARGE INSTRUCTIONS:   1. PCP f/u in 1-2 weeks  DIET:   Cardiac diet  ACTIVITY:   Activity as tolerated  OXYGEN:   Home Oxygen: No.  Oxygen Delivery: room air  DISCHARGE LOCATION:   home   If you experience worsening of your admission symptoms, develop shortness of breath, life threatening emergency, suicidal or homicidal thoughts you must seek  medical attention immediately by calling 911 or calling your MD immediately  if symptoms less severe.  You Must read complete instructions/literature along with all the possible adverse reactions/side effects for all the Medicines you take and that have been prescribed to you. Take any new Medicines after you have completely understood and accpet all the possible adverse reactions/side effects.   Please note  You were cared for by a hospitalist during your hospital stay. If you have any questions about your discharge medications or the care you received while you were in the hospital after you are discharged, you can call the unit and asked to speak with the hospitalist on call if the hospitalist that took care of you is not available. Once you are discharged, your primary care physician will handle any further medical issues. Please note that NO REFILLS for any discharge medications will be authorized once you are discharged, as it is imperative that you return to your primary care physician (or establish a relationship with a primary care physician if you do not have one) for your aftercare needs so that they can reassess your need for medications and monitor your lab values.    On the day of Discharge:  VITAL SIGNS:   Blood pressure 129/80, pulse 85, temperature 97.9 F (36.6 C), temperature source Oral, resp. rate 16, height 5\' 2"  (1.575 m), weight 68.6 kg (151 lb 4.8 oz), SpO2 97 %.  PHYSICAL EXAMINATION:    GENERAL:  83 y.o.-year-old patient lying in the bed with no acute distress.  EYES: Pupils equal, round, reactive to light and accommodation. No scleral icterus. Extraocular muscles intact.  HEENT: Head atraumatic, normocephalic. Oropharynx and nasopharynx clear.  NECK:  Supple, no jugular venous distention. No thyroid enlargement, no tenderness.  LUNGS: Normal breath sounds bilaterally, no wheezing, rales,rhonchi or crepitation. No use of accessory muscles of respiration. Decreased  bibasilar breath sounds CARDIOVASCULAR: S1, S2 normal. No   rubs, or gallops. 2/6 systolic murmur present ABDOMEN: Soft, non-tender, non-distended. Bowel sounds present. No organomegaly or mass.  EXTREMITIES: No pedal edema, cyanosis, or clubbing.  NEUROLOGIC: Cranial nerves II through XII are intact. Muscle strength 5/5 in all extremities. Sensation intact. Gait not checked.  PSYCHIATRIC: The patient is alert and oriented x 3.  SKIN: No obvious rash, lesion, or ulcer.   DATA REVIEW:   CBC Recent Labs  Lab 01/08/17 0652  WBC 6.6  HGB 11.8*  HCT 35.4  PLT 305    Chemistries  Recent Labs  Lab 01/07/17 2054 01/08/17 0652  NA 138 138  K 3.9 4.0  CL 107 107  CO2 23 24  GLUCOSE 223* 96  BUN 22* 15  CREATININE 1.02* 0.76  CALCIUM 8.9 9.1  AST 23  --   ALT 14  --   ALKPHOS 83  --   BILITOT <0.1*  --      Microbiology Results  Results for orders placed or performed during the hospital encounter of 03/10/16  Aerobic/Anaerobic Culture (surgical/deep wound)  Status: None   Collection Time: 03/10/16 11:30 AM  Result Value Ref Range Status   Specimen Description MAXILLARY SINUS  Final   Special Requests NONE right maxillary sinus culture  Final   Gram Stain   Final    MODERATE WBC PRESENT,BOTH PMN AND MONONUCLEAR NO ORGANISMS SEEN    Culture   Final    RARE MORGANELLA MORGANII RARE STAPHYLOCOCCUS SPECIES (COAGULASE NEGATIVE) NO ANAEROBES ISOLATED Performed at [facility], [place] [address] [place], [place] [address]    Report Status 03/15/2016 FINAL  Final   Organism ID, Bacteria MORGANELLA MORGANII  Final      Susceptibility   Morganella morganii - MIC*    AMPICILLIN >=32 RESISTANT Resistant     CEFAZOLIN >=64 RESISTANT Resistant     CEFEPIME <=1 SENSITIVE Sensitive     CEFTAZIDIME <=1 SENSITIVE Sensitive     CEFTRIAXONE <=1 SENSITIVE Sensitive     CIPROFLOXACIN <=0.25 SENSITIVE Sensitive     GENTAMICIN <=1 SENSITIVE Sensitive     IMIPENEM 1 SENSITIVE  Sensitive     TRIMETH/SULFA <=20 SENSITIVE Sensitive     AMPICILLIN/SULBACTAM >=32 RESISTANT Resistant     PIP/TAZO <=4 SENSITIVE Sensitive     * RARE MORGANELLA MORGANII    RADIOLOGY:  Dg Chest 2 View  Result Date: 01/07/2017 CLINICAL DATA:  Syncopal episodes. EXAM: CHEST  2 VIEW COMPARISON:  12/14/2016 FINDINGS: Cardiomediastinal silhouette is normal. Mediastinal contours appear intact. Calcific atherosclerotic disease and tortuosity of the aorta. There is no evidence of focal airspace consolidation, pleural effusion or pneumothorax. Osseous structures are without acute abnormality. Soft tissues are grossly normal. IMPRESSION: No active cardiopulmonary disease. Calcific atherosclerotic disease and tortuosity of the aorta. Electronically Signed   By: Fidela Salisbury M.D.   On: 01/07/2017 22:27     Management plans discussed with the patient, family and they are in agreement.  CODE STATUS:     Code Status Orders  (From admission, onward)        Start     Ordered   01/08/17 0117  Full code  Continuous     01/08/17 0116    Code Status History    Date Active Date Inactive Code Status Order ID Comments User Context   This patient has a current code status but no historical code status.      TOTAL TIME TAKING CARE OF THIS PATIENT: 38 minutes.    Gladstone Lighter M.D on 01/08/2017 at 10:58 AM  Between 7am to 6pm - Pager - [phone]  After 6pm go to [email or web address] - password [email or web address] [facility]  [facility]  Office  [phone]  CC: Primary care physician; Jerrol Banana., MD   Note: This dictation was prepared with Dragon dictation along with smaller phrase technology. Any transcriptional errors that result from this process are unintentional.

## 2017-01-08 NOTE — Care Management CC44 (Signed)
Condition Code 44 Documentation Completed  Patient Details  Name: Kathy Pacheco MRN: [id number] Date of Birth: 03-Sep-1931   Condition Code 44 given:  Yes Patient signature on Condition Code 44 notice:  Yes Documentation of 2 MD's agreement:  Yes Code 44 added to claim:  Yes    Katrina Stack, RN 01/08/2017, 2:00 PM

## 2017-01-08 NOTE — ED Notes (Signed)
Pt assisted up to commode. 

## 2017-01-08 NOTE — Care Management Obs Status (Signed)
[place] NOTIFICATION   Patient Details  Name: Kathy Pacheco MRN: [id number] Date of Birth: 05-06-1931   Medicare Observation Status Notification Given:  Yes Notice signed, one given to patient and the other to HIM for scanning    Katrina Stack, RN 01/08/2017, 2:00 PM

## 2017-01-08 NOTE — Plan of Care (Signed)
  Progressing Education: Knowledge of General Education information will improve 01/08/2017 0850 - Progressing by Etheleen Nicks, RN Pain Managment: General experience of comfort will improve 01/08/2017 0850 - Progressing by Etheleen Nicks, RN Note PRN medications   Not Progressing Clinical Measurements: Diagnostic test results will improve 01/08/2017 0850 - Not Progressing by Etheleen Nicks, RN Note Treating pt for UTI

## 2017-01-08 NOTE — ED Notes (Addendum)
Transport to floor room 246.AS

## 2017-01-08 NOTE — Progress Notes (Signed)
Ambulated around nursing station without symptoms of dizziness or shortness of breath.

## 2017-01-08 NOTE — Progress Notes (Signed)
Patient admitted to room 246 with the diagnosis of syncope. Alert and oriented x 4. Denied any acute pain or distress.  Skin assessment done with Leonette Monarch RN, no skin issues to report. Safety risk assessed. Bed alarm activated and the bed is in the lowest position.  Patient is oriented to her room, call/ascom.  Will continue to monitor.

## 2017-01-08 NOTE — Progress Notes (Signed)
Pt discharged to home via wc.  Instructions  given to pt.  Questions answered.  No distress.  

## 2017-01-08 NOTE — Progress Notes (Signed)
*  PRELIMINARY RESULTS* Echocardiogram 2D Echocardiogram has been performed.  Sherrie Sport 01/08/2017, 10:49 AM

## 2017-01-09 LAB — URINE CULTURE: CULTURE: NO GROWTH

## 2017-01-17 ENCOUNTER — Inpatient Hospital Stay: Payer: Medicare Other | Admitting: Family Medicine

## 2017-01-22 ENCOUNTER — Ambulatory Visit (INDEPENDENT_AMBULATORY_CARE_PROVIDER_SITE_OTHER): Payer: Medicare Other | Admitting: Family Medicine

## 2017-01-22 ENCOUNTER — Other Ambulatory Visit: Payer: Self-pay

## 2017-01-22 VITALS — BP 158/80 | HR 68 | Temp 97.7°F | Resp 16 | Wt 154.0 lb

## 2017-01-22 DIAGNOSIS — F419 Anxiety disorder, unspecified: Secondary | ICD-10-CM | POA: Diagnosis not present

## 2017-01-22 DIAGNOSIS — I10 Essential (primary) hypertension: Secondary | ICD-10-CM | POA: Diagnosis not present

## 2017-01-22 DIAGNOSIS — R55 Syncope and collapse: Secondary | ICD-10-CM | POA: Diagnosis not present

## 2017-01-22 DIAGNOSIS — J302 Other seasonal allergic rhinitis: Secondary | ICD-10-CM | POA: Diagnosis not present

## 2017-01-22 NOTE — Progress Notes (Signed)
Kathy Pacheco  MRN: [id number] DOB: 01/19/1932  Subjective:  HPI   The patient is an 83 year old female who presents for follow up of her recent hospital admission.  She was hospitalized at [facility] on 01/07/17-01/08/17 with diagnosis of syncope.  It was documented as being most likely a vasovagal reaction.  Patient had CXSR and cardiac echo that were WNL.  She had negative UA but treated for symptomatic cystitis with ZPak.   The patient states that she is doing better.  She has not had any more symptoms of syncope.  She stated that she thinks it came from over eating.  She said it has happened before and it was after eating a big meal.  The doctor at the hospital told her she would be better to eat 4 smaller meals in a day. The patient was also seen in our office prior to the hospitalization for her cough.  She was put on a ZPak and states she has not had any more cough.  She states she thinks that was related to working in the yard.  She feels she gets more coughing when she is out in the yard more.   Patient Active Problem List   Diagnosis Date Noted  . Syncope 01/07/2017  . UTI (urinary tract infection) 01/07/2017  . Absolute anemia 11/03/2014  . Anxiety 11/03/2014  . Bilateral cataracts 11/03/2014  . COPD, mild ([place]) 11/03/2014  . Acute cystitis 11/03/2014  . 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 Esophagitis, reflux 11/03/2014  . Anxiety, generalized 11/03/2014  . Acid reflux 11/03/2014  . Hypercholesteremia 11/03/2014  . HLD (hyperlipidemia) 11/03/2014  . BP (high blood pressure) 11/03/2014  . Affective disorder, major 11/03/2014  . Menopausal and perimenopausal disorder 11/03/2014  . Obstructive apnea 11/03/2014  . Awareness of heartbeats 11/03/2014  . Solitary pulmonary nodule 11/03/2014  . Hypercholesterolemia without hypertriglyceridemia 11/03/2014  . Allergic rhinitis, seasonal 11/03/2014  . H/O cataract extraction 11/03/2014  . Apnea, sleep 12/16/2013    Past Medical  History:  Diagnosis Date  . Anxiety   . GERD (gastroesophageal reflux disease)   . High cholesterol   . Hypercholesteremia   . Hypertension   . PONV (postoperative nausea and vomiting)     Social History   Socioeconomic History  . Marital status: Widowed    Spouse name: Not on file  . Number of children: Not on file  . Years of education: Not on file  . Highest education level: Not on file  Social Needs  . Financial resource strain: Not on file  . Food insecurity - worry: Not on file  . Food insecurity - inability: Not on file  . Transportation needs - medical: Not on file  . Transportation needs - non-medical: Not on file  Occupational History  . Not on file  Tobacco Use  . Smoking status: Never Smoker  . Smokeless tobacco: Never Used  Substance and Sexual Activity  . Alcohol use: No    Alcohol/week: 0.0 oz  . Drug use: No  . Sexual activity: Not on file  Other Topics Concern  . Not on file  Social History Narrative  . Not on file    Outpatient Encounter Medications as of 01/22/2017  Medication Sig  . acetaminophen (TYLENOL) 500 MG tablet Take 500 mg by mouth every 6 (six) hours as needed for mild pain.   Marland Kitchen albuterol (PROVENTIL HFA;VENTOLIN HFA) 108 (90 Base) MCG/ACT inhaler Inhale into the lungs every 6 (six) hours as needed  for wheezing or shortness of breath.  Marland Kitchen amLODipine (NORVASC) 5 MG tablet TAKE 1 TABLET (5 MG TOTAL) BY MOUTH DAILY.  . busPIRone (BUSPAR) 10 MG tablet TAKE 1 TABLET (10 MG TOTAL) BY MOUTH 2 (TWO) TIMES DAILY.  . calcium carbonate (OS-CAL) 600 MG TABS tablet Take 600 mg by mouth daily with breakfast.   . citalopram (CELEXA) 10 MG tablet TAKE 1 TABLET (10 MG TOTAL) BY MOUTH DAILY.  . fluticasone (FLONASE) 50 MCG/ACT nasal spray 2 SPRAYS EACH NOSTRIL TWICE DAILY  . Fluticasone-Salmeterol (ADVAIR DISKUS) 500-50 MCG/DOSE AEPB Inhale 1 puff into the lungs 2 (two) times daily.   Marland Kitchen omeprazole (PRILOSEC) 20 MG capsule TAKE 1 CAPSULE (20 MG TOTAL) BY  MOUTH DAILY.  . rosuvastatin (CRESTOR) 5 MG tablet Take 1 tablet (5 mg total) daily by mouth.   No facility-administered encounter medications on file as of 01/22/2017.     Allergies  Allergen Reactions  . Ciprofloxacin Itching    Review of Systems  Constitutional: Negative for fever and malaise/fatigue.  HENT: Negative.   Eyes: Negative.   Respiratory: Negative for cough, shortness of breath and wheezing.   Cardiovascular: Negative for chest pain, palpitations, orthopnea, claudication and leg swelling.  Gastrointestinal: Negative.   Skin: Negative.   Neurological: Negative.  Negative for weakness.  Endo/Heme/Allergies: Negative.   Psychiatric/Behavioral: Negative.     Objective:  BP (!) 158/80 (BP Location: Right Arm, Patient Position: Sitting, Cuff Size: Normal)   Pulse 68   Temp 97.7 F (36.5 C) (Oral)   Resp 16   Wt 154 lb (69.9 kg)   BMI 28.17 kg/m   Physical Exam  Constitutional: She is oriented to person, place, and time and well-developed, well-nourished, and in no distress.  HENT:  Head: Normocephalic and atraumatic.  Right Ear: External ear normal.  Left Ear: External ear normal.  Nose: Nose normal.  Eyes: Conjunctivae are normal. Pupils are equal, round, and reactive to light. No scleral icterus.  Neck: Normal range of motion. No thyromegaly present.  Cardiovascular: Normal rate, regular rhythm and normal heart sounds.  Pulmonary/Chest: Effort normal and breath sounds normal.  Abdominal: Soft.  Neurological: She is alert and oriented to person, place, and time.  Skin: Skin is warm and dry.  Psychiatric: Mood, memory, affect and judgment normal.    Assessment and Plan :  Syncope Negative w/u. Apparently posttussive. Cough Improving. AR HTN HLD  I have done the exam and reviewed the chart and it is accurate to the best of my knowledge. Development worker, community has been used and  any errors in dictation or transcription are unintentional. Miguel Aschoff  M.D. [facility]

## 2017-01-30 ENCOUNTER — Encounter: Payer: Self-pay | Admitting: Family Medicine

## 2017-02-02 ENCOUNTER — Other Ambulatory Visit: Payer: Self-pay | Admitting: Physician Assistant

## 2017-02-02 DIAGNOSIS — F411 Generalized anxiety disorder: Secondary | ICD-10-CM

## 2017-02-02 MED ORDER — BUSPIRONE HCL 30 MG PO TABS: 30.0000 mg | ORAL_TABLET | Freq: Every day | ORAL | 1 refills | Status: DC

## 2017-02-02 NOTE — Progress Notes (Signed)
Buspar 10mg  on national backorder per [facility]. Can only get in Buspar 30mg  tablets at this time. I have sent in Rx for Buspar 30mg  to take once daily.   FYI Dr. Darnell Level.

## 2017-02-02 NOTE — Progress Notes (Signed)
Pt advised-Kathy Pacheco, RMA  

## 2017-05-23 DIAGNOSIS — J339 Nasal polyp, unspecified: Secondary | ICD-10-CM | POA: Diagnosis not present

## 2017-05-23 DIAGNOSIS — J320 Chronic maxillary sinusitis: Secondary | ICD-10-CM | POA: Diagnosis not present

## 2017-06-11 ENCOUNTER — Ambulatory Visit (INDEPENDENT_AMBULATORY_CARE_PROVIDER_SITE_OTHER): Payer: Medicare Other | Admitting: Family Medicine

## 2017-06-11 ENCOUNTER — Encounter: Payer: Self-pay | Admitting: Family Medicine

## 2017-06-11 VITALS — BP 138/62 | HR 68 | Temp 98.4°F | Resp 14 | Wt 154.0 lb

## 2017-06-11 DIAGNOSIS — C4492 Squamous cell carcinoma of skin, unspecified: Secondary | ICD-10-CM | POA: Diagnosis not present

## 2017-06-11 DIAGNOSIS — I10 Essential (primary) hypertension: Secondary | ICD-10-CM

## 2017-06-11 DIAGNOSIS — E7800 Pure hypercholesterolemia, unspecified: Secondary | ICD-10-CM

## 2017-06-11 NOTE — Progress Notes (Signed)
Patient: Kathy Pacheco Female    DOB: 12/07/1931   84 y.o.   MRN: [id number] Visit Date: 06/11/2017  Today's Provider: [name], MD   Chief Complaint  Patient presents with  . Hypertension   Subjective:    HPI  Hypertension, follow-up:  BP Readings from Last 3 Encounters:  06/11/17 138/62  01/22/17 (!) 158/80  01/08/17 129/80    She was last seen for hypertension 6 months ago.  BP at that visit was 158/80. Management since that visit includes none. She reports good compliance with treatment. She is not having side effects.  She is exercising. She is adherent to low salt diet.   Outside blood pressures are not being checked. Patient denies chest pain, chest pressure/discomfort, claudication, exertional chest pressure/discomfort, fatigue, irregular heart beat, lower extremity edema, near-syncope, orthopnea, palpitations, paroxysmal nocturnal dyspnea, syncope and tachypnea.    Wt Readings from Last 3 Encounters:  06/11/17 154 lb (69.9 kg)  01/22/17 154 lb (69.9 kg)  01/08/17 151 lb 4.8 oz (68.6 kg)   ------------------------------------------------------------------------      Allergies  Allergen Reactions  . Ciprofloxacin Itching     Current Outpatient Medications:  .  acetaminophen (TYLENOL) 500 MG tablet, Take 500 mg by mouth every 6 (six) hours as needed for mild pain. , Disp: , Rfl:  .  amLODipine (NORVASC) 5 MG tablet, TAKE 1 TABLET (5 MG TOTAL) BY MOUTH DAILY., Disp: 90 tablet, Rfl: 3 .  busPIRone (BUSPAR) 30 MG tablet, Take 1 tablet (30 mg total) by mouth daily., Disp: 90 tablet, Rfl: 1 .  calcium carbonate (OS-CAL) 600 MG TABS tablet, Take 600 mg by mouth daily with breakfast. , Disp: , Rfl:  .  citalopram (CELEXA) 10 MG tablet, TAKE 1 TABLET (10 MG TOTAL) BY MOUTH DAILY., Disp: 90 tablet, Rfl: 3 .  fluticasone (FLONASE) 50 MCG/ACT nasal spray, 2 SPRAYS EACH NOSTRIL TWICE DAILY, Disp: , Rfl: 9 .  omeprazole (PRILOSEC) 20 MG capsule, TAKE  1 CAPSULE (20 MG TOTAL) BY MOUTH DAILY., Disp: 90 capsule, Rfl: 3 .  rosuvastatin (CRESTOR) 5 MG tablet, Take 1 tablet (5 mg total) daily by mouth., Disp: 90 tablet, Rfl: 3 .  albuterol (PROVENTIL HFA;VENTOLIN HFA) 108 (90 Base) MCG/ACT inhaler, Inhale into the lungs every 6 (six) hours as needed for wheezing or shortness of breath., Disp: , Rfl:  .  Fluticasone-Salmeterol (ADVAIR DISKUS) 500-50 MCG/DOSE AEPB, Inhale 1 puff into the lungs 2 (two) times daily. , Disp: , Rfl:   Review of Systems  Constitutional: Negative.   HENT: Negative.   Eyes: Negative.   Respiratory: Negative.   Cardiovascular: Negative.   Gastrointestinal: Negative.        GERD  Endocrine: Negative.   Genitourinary: Negative.   Musculoskeletal: Negative.   Skin: Negative.   Allergic/Immunologic: Negative.   Neurological: Negative.   Hematological: Negative.   Psychiatric/Behavioral: Negative.     Social History   Tobacco Use  . Smoking status: Never Smoker  . Smokeless tobacco: Never Used  Substance Use Topics  . Alcohol use: No    Alcohol/week: 0.0 oz   Objective:   BP 138/62 (BP Location: Left Arm, Patient Position: Sitting, Cuff Size: Large)   Pulse 68   Temp 98.4 F (36.9 C) (Oral)   Resp 14   Wt 154 lb (69.9 kg)   SpO2 96%   BMI 28.17 kg/m  Vitals:   06/11/17 1455  BP: 138/62  Pulse: 68  Resp: 14  Temp: 98.4 F (36.9 C)  TempSrc: Oral  SpO2: 96%  Weight: 154 lb (69.9 kg)     Physical Exam  Constitutional: She is oriented to person, place, and time. She appears well-developed and well-nourished.  HENT:  Head: Normocephalic and atraumatic.  Eyes: Conjunctivae are normal. No scleral icterus.  Neck: No thyromegaly present.  Cardiovascular: Normal rate, regular rhythm and normal heart sounds.  Pulmonary/Chest: Effort normal and breath sounds normal.  Abdominal: Soft.  Musculoskeletal: She exhibits no edema.  Neurological: She is alert and oriented to person, place, and time.    Skin: Skin is warm and dry.  Psychiatric: She has a normal mood and affect. Her behavior is normal. Judgment and thought content normal.        Assessment & Plan:     1. Essential hypertension  - CBC with Differential/Platelet - TSH  2. Hypercholesteremia  - Lipid panel - Comprehensive metabolic panel  3. 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 - Ambulatory referral to Dermatology      I have done the exam and reviewed the above chart and it is accurate to the best of my knowledge. Development worker, community has been used in this note in any air is in the dictation or transcription are unintentional.  [name], MD  [facility]

## 2017-06-12 DIAGNOSIS — E7800 Pure hypercholesterolemia, unspecified: Secondary | ICD-10-CM | POA: Diagnosis not present

## 2017-06-12 DIAGNOSIS — I10 Essential (primary) hypertension: Secondary | ICD-10-CM | POA: Diagnosis not present

## 2017-06-13 LAB — COMPREHENSIVE METABOLIC PANEL
A/G RATIO: 1.4 (ref 1.2–2.2)
ALT: 12 IU/L (ref 0–32)
AST: 15 IU/L (ref 0–40)
Albumin: 3.8 g/dL (ref 3.5–4.7)
Alkaline Phosphatase: 88 IU/L (ref 39–117)
BUN/Creatinine Ratio: 16 (ref 12–28)
BUN: 16 mg/dL (ref 8–27)
Bilirubin Total: 0.3 mg/dL (ref 0.0–1.2)
CALCIUM: 9.3 mg/dL (ref 8.7–10.3)
CO2: 25 mmol/L (ref 20–29)
CREATININE: 0.98 mg/dL (ref 0.57–1.00)
Chloride: 104 mmol/L (ref 96–106)
GFR, EST AFRICAN AMERICAN: 62 mL/min/{1.73_m2} (ref >59)
GFR, EST NON AFRICAN AMERICAN: 54 mL/min/{1.73_m2} — AB (ref >59)
GLUCOSE: 83 mg/dL (ref 65–99)
Globulin, Total: 2.7 g/dL (ref 1.5–4.5)
POTASSIUM: 4.5 mmol/L (ref 3.5–5.2)
Sodium: 142 mmol/L (ref 134–144)
TOTAL PROTEIN: 6.5 g/dL (ref 6.0–8.5)

## 2017-06-13 LAB — CBC WITH DIFFERENTIAL/PLATELET
BASOS: 2 %
Basophils Absolute: 0.1 10*3/uL (ref 0.0–0.2)
EOS (ABSOLUTE): 0.7 10*3/uL — AB (ref 0.0–0.4)
EOS: 12 %
Hematocrit: 35 % (ref 34.0–46.6)
Hemoglobin: 11.2 g/dL (ref 11.1–15.9)
IMMATURE GRANULOCYTES: 0 %
Immature Grans (Abs): 0 10*3/uL (ref 0.0–0.1)
LYMPHS ABS: 1.6 10*3/uL (ref 0.7–3.1)
Lymphs: 26 %
MCH: 27.3 pg (ref 26.6–33.0)
MCHC: 32 g/dL (ref 31.5–35.7)
MCV: 85 fL (ref 79–97)
MONOS ABS: 0.8 10*3/uL (ref 0.1–0.9)
Monocytes: 12 %
NEUTROS PCT: 48 %
Neutrophils Absolute: 3 10*3/uL (ref 1.4–7.0)
Platelets: 309 10*3/uL (ref 150–379)
RBC: 4.1 x10E6/uL (ref 3.77–5.28)
RDW: 15.7 % — AB (ref 12.3–15.4)
WBC: 6.1 10*3/uL (ref 3.4–10.8)

## 2017-06-13 LAB — LIPID PANEL
CHOL/HDL RATIO: 3.5 ratio (ref 0.0–4.4)
Cholesterol, Total: 177 mg/dL (ref 100–199)
HDL: 50 mg/dL (ref >39)
LDL Calculated: 108 mg/dL — ABNORMAL HIGH (ref 0–99)
Triglycerides: 97 mg/dL (ref 0–149)
VLDL Cholesterol Cal: 19 mg/dL (ref 5–40)

## 2017-06-13 LAB — TSH: TSH: 1.35 u[IU]/mL (ref 0.450–4.500)

## 2017-06-27 DIAGNOSIS — G4733 Obstructive sleep apnea (adult) (pediatric): Secondary | ICD-10-CM | POA: Diagnosis not present

## 2017-06-27 DIAGNOSIS — I10 Essential (primary) hypertension: Secondary | ICD-10-CM | POA: Diagnosis not present

## 2017-06-27 DIAGNOSIS — E7801 Familial hypercholesterolemia: Secondary | ICD-10-CM | POA: Diagnosis not present

## 2017-07-12 DIAGNOSIS — L738 Other specified follicular disorders: Secondary | ICD-10-CM | POA: Diagnosis not present

## 2017-07-12 DIAGNOSIS — R21 Rash and other nonspecific skin eruption: Secondary | ICD-10-CM | POA: Diagnosis not present

## 2017-07-12 DIAGNOSIS — D1800 Hemangioma unspecified site: Secondary | ICD-10-CM | POA: Diagnosis not present

## 2017-07-12 DIAGNOSIS — D229 Melanocytic nevi, unspecified: Secondary | ICD-10-CM | POA: Diagnosis not present

## 2017-07-12 DIAGNOSIS — L821 Other seborrheic keratosis: Secondary | ICD-10-CM | POA: Diagnosis not present

## 2017-07-12 DIAGNOSIS — L570 Actinic keratosis: Secondary | ICD-10-CM | POA: Diagnosis not present

## 2017-07-19 ENCOUNTER — Other Ambulatory Visit: Payer: Self-pay | Admitting: Unknown Physician Specialty

## 2017-07-19 ENCOUNTER — Ambulatory Visit
Admission: RE | Admit: 2017-07-19 | Discharge: 2017-07-19 | Disposition: A | Discharge status: Home / Self Care | Payer: Medicare Other | Source: Ambulatory Visit | Attending: Unknown Physician Specialty | Admitting: Unknown Physician Specialty

## 2017-07-19 DIAGNOSIS — R05 Cough: Secondary | ICD-10-CM

## 2017-07-19 DIAGNOSIS — K219 Gastro-esophageal reflux disease without esophagitis: Secondary | ICD-10-CM | POA: Diagnosis not present

## 2017-07-19 DIAGNOSIS — R059 Cough, unspecified: Secondary | ICD-10-CM

## 2017-07-27 ENCOUNTER — Other Ambulatory Visit: Payer: Self-pay | Admitting: Family Medicine

## 2017-07-27 NOTE — Telephone Encounter (Signed)
Pharmacy requesting refills. Thanks!  

## 2017-07-31 DIAGNOSIS — Z961 Presence of intraocular lens: Secondary | ICD-10-CM | POA: Diagnosis not present

## 2017-08-12 ENCOUNTER — Other Ambulatory Visit: Payer: Self-pay | Admitting: Family Medicine

## 2017-08-12 ENCOUNTER — Other Ambulatory Visit: Payer: Self-pay | Admitting: Physician Assistant

## 2017-08-12 DIAGNOSIS — F411 Generalized anxiety disorder: Secondary | ICD-10-CM

## 2017-08-22 ENCOUNTER — Encounter: Payer: Self-pay | Admitting: Internal Medicine

## 2017-08-22 ENCOUNTER — Ambulatory Visit (INDEPENDENT_AMBULATORY_CARE_PROVIDER_SITE_OTHER): Payer: Medicare Other | Admitting: Internal Medicine

## 2017-08-22 VITALS — BP 106/62 | HR 72 | Ht 63.0 in | Wt 152.0 lb

## 2017-08-22 DIAGNOSIS — J4520 Mild intermittent asthma, uncomplicated: Secondary | ICD-10-CM | POA: Diagnosis not present

## 2017-08-22 DIAGNOSIS — R059 Cough, unspecified: Secondary | ICD-10-CM

## 2017-08-22 DIAGNOSIS — R05 Cough: Secondary | ICD-10-CM | POA: Diagnosis not present

## 2017-08-22 DIAGNOSIS — K219 Gastro-esophageal reflux disease without esophagitis: Secondary | ICD-10-CM

## 2017-08-22 MED ORDER — CETIRIZINE HCL 10 MG PO TABS: 10.0000 mg | ORAL_TABLET | Freq: Every day | ORAL | 6 refills | Status: DC

## 2017-08-22 NOTE — Patient Instructions (Addendum)
SPEECH THERAPY AND SWALLOW EVALUATION NEEDED CONTINUE INHALERS AS PRESCRIBED NEEDS REFERRAL TO GI DR ELLIOT START ZYRTEC 10 MG at NIGHT

## 2017-08-22 NOTE — Progress Notes (Signed)
Name: LULIA SCHRINER MRN: [id number] DOB: 09-22-1931     CONSULTATION DATE:  Loistine Chance MD : Tami Ribas  CHIEF COMPLAINT: Cough  STUDIES:     6.13.19 CXR independently reviewed by Me No acute pneumonia No effusions No acute process seen    HISTORY OF PRESENT ILLNESS: 84 year old pleasant white female seen today for chronic cough Her symptoms have been going on for many years Patient states that when she talks too much she starts coughing She also noticed increased mucus production over the last several months Patient was seen by Dr. Tami Ribas with ENT and patient was prescribed Flovent inhaler therapy over the last month and seems to be helping her cough Patient also has a history of GERD and her Prilosec was increased to 40 mg daily which has helped her cough at this time Patient also uses albuterol inhaler  Her daughter is present at the time of office visit which she thinks the patient may be possibly aspirating foods and liquids The patient has described as having several choking episodes over the last couple months Patient has no signs of symptoms of infection at this time No signs of heart failure at this time  Patient did not note that she has intermittent wheezing  Patient also has allergic rhinitis which she takes Flonase   Office spirometry 7.17.19 shows no obvious evidence of obstructive or restrictive lung disease   PAST MEDICAL HISTORY :   has a past medical history of Anxiety, GERD (gastroesophageal reflux disease), High cholesterol, Hypercholesteremia, Hypertension, and PONV (postoperative nausea and vomiting).  has a past surgical history that includes Cataract extraction (Bilateral); Hernia repair; Image guided sinus surgery (Bilateral, 03/10/2016); Septoplasty (Bilateral, 03/10/2016); Nasal turbinate reduction (Bilateral, 03/10/2016); Sphenoidectomy (Bilateral, 03/10/2016); Frontal sinus exploration (Bilateral, 03/10/2016); Maxillary antrostomy (Bilateral, 03/10/2016);  and Ethmoidectomy (Bilateral, 03/10/2016). Prior to Admission medications   Medication Sig Start Date End Date Taking? Authorizing Provider  acetaminophen (TYLENOL) 500 MG tablet Take 500 mg by mouth every 6 (six) hours as needed for mild pain.    Yes [provider]  albuterol (PROVENTIL HFA;VENTOLIN HFA) 108 (90 Base) MCG/ACT inhaler Inhale into the lungs every 6 (six) hours as needed for wheezing or shortness of breath.   Yes [provider]  amLODipine (NORVASC) 5 MG tablet TAKE 1 TABLET (5 MG TOTAL) BY MOUTH DAILY. 11/01/16  Yes Jerrol Banana., MD  busPIRone (BUSPAR) 30 MG tablet TAKE 1 TABLET BY MOUTH EVERY DAY 08/13/17  Yes Jerrol Banana., MD  calcium carbonate (OS-CAL) 600 MG TABS tablet Take 600 mg by mouth daily with breakfast.    Yes [provider]  citalopram (CELEXA) 10 MG tablet TAKE 1 TABLET (10 MG TOTAL) BY MOUTH DAILY. 08/13/17  Yes Jerrol Banana., MD  FLOVENT [facility] 110 MCG/ACT inhaler Inhale 2 puffs into the lungs 2 (two) times daily. 08/13/17  Yes [provider]  fluticasone (FLONASE) 50 MCG/ACT nasal spray 2 SPRAYS EACH NOSTRIL TWICE DAILY 07/04/16  Yes [provider]  omeprazole (PRILOSEC) 40 MG capsule Take 40 mg by mouth daily. 07/19/17  Yes [provider]  rosuvastatin (CRESTOR) 5 MG tablet Take 1 tablet (5 mg total) daily by mouth. 12/14/16  Yes Jerrol Banana., MD   Allergies  Allergen Reactions  . Ciprofloxacin Itching    FAMILY HISTORY:  family history includes Allergies in her father; Arthritis in her brother; Breast cancer (age of onset: 22) in her sister; COPD in her brother, father, and sister;  CVA in her brother, brother, and father; Cancer in her brother, maternal grandmother, and sister; Emphysema in her father; Epilepsy in her mother; Healthy in her brother and brother; Heart disease in her brother; Hypertension in her father and maternal grandmother; Lung cancer in her father;  Osteoporosis in her mother and sister. SOCIAL HISTORY:  reports that she has never smoked. She has never used smokeless tobacco. She reports that she does not drink alcohol or use drugs.  REVIEW OF SYSTEMS:   Constitutional: Negative for fever, chills, weight loss, malaise/fatigue and diaphoresis.  HENT: Negative for hearing loss, ear pain, nosebleeds, congestion, sore throat, neck pain, tinnitus and ear discharge.   Eyes: Negative for blurred vision, double vision, photophobia, pain, discharge and redness.  Respiratory: + cough, -hemoptysis, -sputum production,- shortness of breath, +wheezing and= stridor.   Cardiovascular: Negative for chest pain, palpitations, orthopnea, claudication, leg swelling and PND.  Gastrointestinal: Negative for heartburn, nausea, vomiting, abdominal pain, diarrhea, constipation, blood in stool and melena.  Genitourinary: Negative for dysuria, urgency, frequency, hematuria and flank pain.  Musculoskeletal: Negative for myalgias, back pain, joint pain and falls.  Skin: Negative for itching and rash.  Neurological: Negative for dizziness, tingling, tremors, sensory change, speech change, focal weakness, seizures, loss of consciousness, weakness and headaches.  Endo/Heme/Allergies: Negative for environmental allergies and polydipsia. Does not bruise/bleed easily.  ALL OTHER ROS ARE NEGATIVE    BP 106/62 (BP Location: Left Arm, Cuff Size: Normal)   Pulse 72   Ht 5\' 3"  (1.6 m)   Wt 152 lb (68.9 kg)   SpO2 100%   BMI 26.93 kg/m    Physical Examination:   GENERAL:NAD, no fevers, chills, no weakness no fatigue HEAD: Normocephalic, atraumatic.  EYES: Pupils equal, round, reactive to light. Extraocular muscles intact. No scleral icterus.  MOUTH: Moist mucosal membrane.   EAR, NOSE, THROAT: Clear without exudates. No external lesions.  NECK: Supple. No thyromegaly. No nodules. No JVD.  PULMONARY:CTA B/L no wheezes, no crackles, no rhonchi CARDIOVASCULAR: S1  and S2. Regular rate and rhythm. No murmurs, rubs, or gallops. No edema.  GASTROINTESTINAL: Soft, nontender, nondistended. No masses. Positive bowel sounds.  MUSCULOSKELETAL: No swelling, clubbing, or edema. Range of motion full in all extremities.  NEUROLOGIC: Cranial nerves II through XII are intact. No gross focal neurological deficits.  SKIN: No ulceration, lesions, rashes, or cyanosis. Skin warm and dry. Turgor intact.  PSYCHIATRIC: Mood, affect within normal limits. The patient is awake, alert and oriented x 3. Insight, judgment intact.      ASSESSMENT / PLAN: 84 year old pleasant white female seen today for chronic cough for many years in the setting of GERD as well as probable underlying reactive airways disease in the setting of chronic allergic rhinitis with possible signs of aspiration pneumonitis  At this time the plan of care is as follows  #1 speech and swallow evaluation needed to assess for aspiration #2 recommend reconsultation with Dr. Vira Agar with no clinic to reassess her GERD Continue PPI as prescribed #3 reactive airways disease Continue inhalers as prescribed patient has Flovent and albuterol inhalers prescribed #4 allergic rhinitis continue Flonase We will start Zyrtec 10 mg at night   Patient/Family are satisfied with Plan of action and management. All questions answered Follow up in 3 months   Junell Cullifer Patricia Pesa, M.D.  [facility]  Medical Director [place] Director [facility] Cardio-Pulmonary Department

## 2017-09-07 ENCOUNTER — Ambulatory Visit
Admission: RE | Admit: 2017-09-07 | Discharge: 2017-09-07 | Disposition: A | Discharge status: Home / Self Care | Payer: Medicare Other | Source: Ambulatory Visit | Attending: Internal Medicine | Admitting: Internal Medicine

## 2017-09-07 DIAGNOSIS — R1312 Dysphagia, oropharyngeal phase: Secondary | ICD-10-CM | POA: Diagnosis not present

## 2017-09-07 DIAGNOSIS — K219 Gastro-esophageal reflux disease without esophagitis: Secondary | ICD-10-CM | POA: Insufficient documentation

## 2017-09-07 DIAGNOSIS — R05 Cough: Secondary | ICD-10-CM | POA: Diagnosis not present

## 2017-09-07 DIAGNOSIS — R1310 Dysphagia, unspecified: Secondary | ICD-10-CM | POA: Diagnosis not present

## 2017-09-07 NOTE — Therapy (Signed)
[place], [place], [address] Phone: [phone]   Fax:     Modified Barium Swallow  Patient Details  Name: Kathy Pacheco MRN: [id number] Date of Birth: 01-27-1932 No data recorded  Encounter Date: 09/07/2017  End of Session - 09/07/17 1305    Visit Number  1    Number of Visits  1    Date for SLP Re-Evaluation  09/07/17    SLP Start Time  18    SLP Stop Time   1306    SLP Time Calculation (min)  46 min    Activity Tolerance  Patient tolerated treatment well       Past Medical History:  Diagnosis Date  . Anxiety   . GERD (gastroesophageal reflux disease)   . High cholesterol   . Hypercholesteremia   . Hypertension   . PONV (postoperative nausea and vomiting)     Past Surgical History:  Procedure Laterality Date  . CATARACT EXTRACTION Bilateral   . ETHMOIDECTOMY Bilateral 03/10/2016   Procedure: ETHMOIDECTOMY;  Surgeon: Beverly Gust, MD;  Location: [facility];  Service: ENT;  Laterality: Bilateral;  . FRONTAL SINUS EXPLORATION Bilateral 03/10/2016   Procedure: FRONTAL WITH TISSUE REMOVAL;  Surgeon: Beverly Gust, MD;  Location: [facility];  Service: ENT;  Laterality: Bilateral;  . HERNIA REPAIR    . IMAGE GUIDED SINUS SURGERY Bilateral 03/10/2016   Procedure: IMAGE GUIDED SINUS SURGERY;  Surgeon: Beverly Gust, MD;  Location: [facility];  Service: ENT;  Laterality: Bilateral;  Need Disk GAVE DISK TO CECE 1-25 KP  . MAXILLARY ANTROSTOMY Bilateral 03/10/2016   Procedure: MAXILLARY ANTROSTOMY;  Surgeon: Beverly Gust, MD;  Location: [facility];  Service: ENT;  Laterality: Bilateral;  . NASAL TURBINATE REDUCTION Bilateral 03/10/2016   Procedure: TURBINATE REDUCTION/SUBMUCOSAL RESECTION;  Surgeon: Beverly Gust, MD;  Location: [facility];  Service: ENT;  Laterality: Bilateral;  . SEPTOPLASTY Bilateral 03/10/2016   Procedure: SEPTOPLASTY;  Surgeon:  Beverly Gust, MD;  Location: [facility];  Service: ENT;  Laterality: Bilateral;  . SPHENOIDECTOMY Bilateral 03/10/2016   Procedure: SPHENOIDECTOMY WITH TISSUE REMOVAL;  Surgeon: Beverly Gust, MD;  Location: [facility];  Service: ENT;  Laterality: Bilateral;    There were no vitals filed for this visit.   Subjective: Patient behavior: (alertness, ability to follow instructions, etc.):  The patient is able to verbalize her symptoms and follow directions  Chief complaint: patient reports improvement in her symptoms since beginning medication recommended by Dr. Tami Ribas.  The patient's daughter is concerned that the patient is aspirating.   Objective:  Radiological Procedure: A videoflouroscopic evaluation of oral-preparatory, reflex initiation, and pharyngeal phases of the swallow was performed; as well as a screening of the upper esophageal phase.  I. POSTURE: Upright in MBS chair  II. VIEW: Lateral  III. COMPENSATORY STRATEGIES: N/A  IV. BOLUSES ADMINISTERED:   Thin Liquid: 1 small, 3 rapid consecutive   Nectar-thick Liquid: 1 moderate   Honey-thick Liquid: DNT   Puree: 2 teaspoon presentations   Mechanical Soft: 1/4 graham cracker in applesauce  V. RESULTS OF EVALUATION: A. ORAL PREPARATORY PHASE: (The lips, tongue, and velum are observed for strength and coordination)       **Overall Severity Rating: Within normal limits  B. SWALLOW INITIATION/REFLEX: (The reflex is normal if "triggered" by the time the bolus reached the base of the tongue)  **Overall Severity Rating: Mild; triggers while falling from the valleculae to the  pyriform sinuses  C. PHARYNGEAL PHASE: (Pharyngeal function is normal if the bolus shows rapid, smooth, and continuous transit through the pharynx and there is no pharyngeal residue after the swallow)  **Overall Severity Rating: Within normal limits  D. LARYNGEAL PENETRATION: (Material entering into the laryngeal inlet/vestibule but  not aspirated) None  E. ASPIRATION: None  F. ESOPHAGEAL PHASE: (Screening of the upper esophagus):  aerophagia  ASSESSMENT: This 84 year old woman; with cough and concern for aspiration; is presenting with minimal oropharyngeal dysphagia characterized by delayed pharyngeal swallow initiation. Oral control of the bolus including oral hold, rotary mastication, and anterior to posterior transfer is within normal limits.  Aspects of the pharyngeal stage of swallowing including tongue base retraction, hyolaryngeal excursion, epiglottic inversion, and duration/amplitude of UES opening are within normal limits.  There is no significant pharyngeal residue, laryngeal penetration, or tracheal aspiration.  The patient's complaints do not appear to be due to oropharyngeal swallow function.  The patient self-identifies strategies to reduce coughing and mealtime discomfort, including eating slower, softening and moistening foods, and eating less at a time.  PLAN/RECOMMENDATIONS:   A. Diet: Regular, soften and moisten as needed for comfort   B. Swallowing Precautions: continue to eat slowly, take small bite and sips, and avoid overeating   C. Recommended consultation to: follow up with MDs as recommended   D. Therapy recommendations: speech therapy is not indicated   E. Results and recommendations were discussed with the patient and her daughter immediately following the study and the final report routed to the referring MD.  Oropharyngeal dysphagia - Plan: DG OP Swallowing Func-Medicare/Speech Path, DG OP Swallowing Func-Medicare/Speech Path        Problem List Patient Active Problem List   Diagnosis Date Noted  . Syncope 01/07/2017  . UTI (urinary tract infection) 01/07/2017  . Absolute anemia 11/03/2014  . Anxiety 11/03/2014  . Bilateral cataracts 11/03/2014  . COPD, mild ([place]) 11/03/2014  . Acute cystitis 11/03/2014  . 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 Esophagitis, reflux 11/03/2014   . Anxiety, generalized 11/03/2014  . Acid reflux 11/03/2014  . Hypercholesteremia 11/03/2014  . HLD (hyperlipidemia) 11/03/2014  . BP (high blood pressure) 11/03/2014  . Affective disorder, major 11/03/2014  . Menopausal and perimenopausal disorder 11/03/2014  . Obstructive apnea 11/03/2014  . Awareness of heartbeats 11/03/2014  . Solitary pulmonary nodule 11/03/2014  . Hypercholesterolemia without hypertriglyceridemia 11/03/2014  . Allergic rhinitis, seasonal 11/03/2014  . H/O cataract extraction 11/03/2014  . Apnea, sleep 12/16/2013   Leroy Sea, MS/CCC- SLP  Lou Miner 09/07/2017, 1:07 PM  [facility] [place], [place], [address] Phone: [phone]   Fax:     Name: Kathy Pacheco MRN: [id number] Date of Birth: Jul 16, 1931

## 2017-09-11 DIAGNOSIS — K219 Gastro-esophageal reflux disease without esophagitis: Secondary | ICD-10-CM | POA: Diagnosis not present

## 2017-10-26 ENCOUNTER — Other Ambulatory Visit: Payer: Self-pay | Admitting: Family Medicine

## 2017-11-21 ENCOUNTER — Ambulatory Visit (INDEPENDENT_AMBULATORY_CARE_PROVIDER_SITE_OTHER): Payer: Medicare Other

## 2017-11-21 DIAGNOSIS — J339 Nasal polyp, unspecified: Secondary | ICD-10-CM | POA: Diagnosis not present

## 2017-11-21 DIAGNOSIS — Z23 Encounter for immunization: Secondary | ICD-10-CM | POA: Diagnosis not present

## 2017-11-21 DIAGNOSIS — R05 Cough: Secondary | ICD-10-CM | POA: Diagnosis not present

## 2017-11-21 NOTE — Progress Notes (Signed)
flu

## 2017-11-22 DIAGNOSIS — D1800 Hemangioma unspecified site: Secondary | ICD-10-CM | POA: Diagnosis not present

## 2017-11-22 DIAGNOSIS — L570 Actinic keratosis: Secondary | ICD-10-CM | POA: Diagnosis not present

## 2017-11-22 DIAGNOSIS — L719 Rosacea, unspecified: Secondary | ICD-10-CM | POA: Diagnosis not present

## 2017-11-22 DIAGNOSIS — L821 Other seborrheic keratosis: Secondary | ICD-10-CM | POA: Diagnosis not present

## 2017-11-28 ENCOUNTER — Ambulatory Visit: Payer: Medicare Other

## 2017-11-28 ENCOUNTER — Ambulatory Visit: Admitting: Internal Medicine

## 2017-12-17 ENCOUNTER — Ambulatory Visit: Payer: Self-pay | Admitting: Family Medicine

## 2017-12-17 ENCOUNTER — Ambulatory Visit: Payer: Medicare Other | Admitting: Family Medicine

## 2017-12-17 ENCOUNTER — Ambulatory Visit: Payer: Medicare Other

## 2017-12-18 DIAGNOSIS — G4733 Obstructive sleep apnea (adult) (pediatric): Secondary | ICD-10-CM | POA: Diagnosis not present

## 2017-12-18 DIAGNOSIS — E7801 Familial hypercholesterolemia: Secondary | ICD-10-CM | POA: Diagnosis not present

## 2017-12-18 DIAGNOSIS — F419 Anxiety disorder, unspecified: Secondary | ICD-10-CM | POA: Diagnosis not present

## 2017-12-18 DIAGNOSIS — I10 Essential (primary) hypertension: Secondary | ICD-10-CM | POA: Diagnosis not present

## 2017-12-20 ENCOUNTER — Encounter: Payer: Self-pay | Admitting: Internal Medicine

## 2017-12-20 ENCOUNTER — Ambulatory Visit (INDEPENDENT_AMBULATORY_CARE_PROVIDER_SITE_OTHER): Payer: Medicare Other | Admitting: Internal Medicine

## 2017-12-20 VITALS — BP 152/68 | HR 61 | Ht 63.0 in | Wt 159.6 lb

## 2017-12-20 DIAGNOSIS — J410 Simple chronic bronchitis: Secondary | ICD-10-CM

## 2017-12-20 DIAGNOSIS — R05 Cough: Secondary | ICD-10-CM | POA: Diagnosis not present

## 2017-12-20 DIAGNOSIS — R059 Cough, unspecified: Secondary | ICD-10-CM

## 2017-12-20 NOTE — Progress Notes (Signed)
   Name: Kathy Pacheco MRN: [id number] DOB: Jun 19, 1931     CONSULTATION DATE: 11.14.19 REFERRING MD : Tami Ribas  CHIEF COMPLAINT: follow up cough  STUDIES:     6.13.19 CXR independently reviewed by Me No acute pneumonia No effusions No acute process seen    HISTORY OF PRESENT ILLNESS:  Cough has improved No signs of infection at this time  on Flovent which has helped a lot Advised to rinse mouth out after every use  Swallow eval was WNL  No sob, chest pain  On flonase and zyrtec Sinus disease is under control     Office spirometry 7.17.19 shows no obvious evidence of obstructive or restrictive lung disease     Review of Systems:  Gen:  Denies  fever, sweats, chills weigh loss  HEENT: Denies blurred vision, double vision, ear pain, eye pain, hearing loss, nose bleeds, sore throat Cardiac:  No dizziness, chest pain or heaviness, chest tightness,edema, No JVD Resp:   No cough Gi: Denies swallowing difficulty, stomach pain, nausea or vomiting, diarrhea, constipation, bowel incontinence Gu:  Denies bladder incontinence, burning urine Ext:   Denies Joint pain, stiffness or swelling Skin: Denies  skin rash, easy bruising or bleeding or hives Endoc:  Denies polyuria, polydipsia , polyphagia or weight change Psych:   Denies depression, insomnia or hallucinations  Other:  All other systems negative  BP (!) 152/68 (BP Location: Left Arm)   Pulse 61   Ht 5\' 3"  (1.6 m)   Wt 159 lb 9.6 oz (72.4 kg)   SpO2 98%   BMI 28.27 kg/m    Physical Examination:   GENERAL:NAD, no fevers, chills, no weakness no fatigue HEAD: Normocephalic, atraumatic.  EYES: Pupils equal, round, reactive to light. Extraocular muscles intact. No scleral icterus.  MOUTH: Moist mucosal membrane. Dentition intact. No abscess noted.  EAR, NOSE, THROAT: Clear without exudates. No external lesions.  NECK: Supple. No thyromegaly. No nodules. No JVD.  PULMONARY: CTA B/L no wheezing, rhonchi,  crackles CARDIOVASCULAR: S1 and S2. Regular rate and rhythm. No murmurs, rubs, or gallops. No edema. Pedal pulses 2+ bilaterally.  GASTROINTESTINAL: Soft, nontender, nondistended. No masses. Positive bowel sounds. No hepatosplenomegaly.  MUSCULOSKELETAL: No swelling, clubbing, or edema. Range of motion full in all extremities.  NEUROLOGIC: Cranial nerves II through XII are intact. No gross focal neurological deficits. Sensation intact. Reflexes intact.  SKIN: No ulceration, lesions, rashes, or cyanosis. Skin warm and dry. Turgor intact.  PSYCHIATRIC: Mood, affect within normal limits. The patient is awake, alert and oriented x 3. Insight, judgment intact.  ALL OTHER ROS ARE NEGATIVE     ASSESSMENT / PLAN: 84 year old pleasant white female seen today for follow-up chronic cough in the setting of reflux disease with intermittent underlying reactive airways disease most likely from simple chronic bronchitis with chronic productive cough which is controlled now with inhaled steroids  Patient underwent swallow eval and speech therapy evaluation which did not reveal any type of significant abnormalities with her swallowing  Continue Flovent as prescribed Albuterol as needed Continue PPI as prescribed for GERD Continue Flonase Continue Zyrtec daily  Follow-up in 1 year   Zaydon Kinser Patricia Pesa, M.D.  [facility]  Medical Director [place] Director [place] Department

## 2017-12-20 NOTE — Patient Instructions (Signed)
Continue Flovent Continue Flonase and Zyrtec daily

## 2017-12-25 ENCOUNTER — Other Ambulatory Visit: Payer: Self-pay

## 2018-01-08 ENCOUNTER — Encounter: Payer: Medicare Other | Admitting: Family Medicine

## 2018-01-08 ENCOUNTER — Ambulatory Visit: Payer: Medicare Other

## 2018-02-07 ENCOUNTER — Ambulatory Visit (INDEPENDENT_AMBULATORY_CARE_PROVIDER_SITE_OTHER): Payer: Medicare Other

## 2018-02-07 ENCOUNTER — Ambulatory Visit (INDEPENDENT_AMBULATORY_CARE_PROVIDER_SITE_OTHER): Payer: Medicare Other | Admitting: Family Medicine

## 2018-02-07 VITALS — BP 120/68 | HR 73 | Temp 97.8°F | Resp 16 | Ht 63.0 in | Wt 159.0 lb

## 2018-02-07 VITALS — BP 120/68 | HR 73 | Temp 97.8°F | Ht 63.0 in | Wt 159.0 lb

## 2018-02-07 DIAGNOSIS — Z0000 Encounter for general adult medical examination without abnormal findings: Secondary | ICD-10-CM | POA: Diagnosis not present

## 2018-02-07 DIAGNOSIS — E7800 Pure hypercholesterolemia, unspecified: Secondary | ICD-10-CM | POA: Diagnosis not present

## 2018-02-07 DIAGNOSIS — I10 Essential (primary) hypertension: Secondary | ICD-10-CM

## 2018-02-07 DIAGNOSIS — E2839 Other primary ovarian failure: Secondary | ICD-10-CM | POA: Diagnosis not present

## 2018-02-07 DIAGNOSIS — F411 Generalized anxiety disorder: Secondary | ICD-10-CM | POA: Diagnosis not present

## 2018-02-07 DIAGNOSIS — F3341 Major depressive disorder, recurrent, in partial remission: Secondary | ICD-10-CM | POA: Diagnosis not present

## 2018-02-07 DIAGNOSIS — Z1239 Encounter for other screening for malignant neoplasm of breast: Secondary | ICD-10-CM | POA: Diagnosis not present

## 2018-02-07 DIAGNOSIS — N959 Unspecified menopausal and perimenopausal disorder: Secondary | ICD-10-CM | POA: Diagnosis not present

## 2018-02-07 DIAGNOSIS — K219 Gastro-esophageal reflux disease without esophagitis: Secondary | ICD-10-CM | POA: Diagnosis not present

## 2018-02-07 MED ORDER — ROSUVASTATIN CALCIUM 5 MG PO TABS: 5.0000 mg | ORAL_TABLET | Freq: Every day | ORAL | 3 refills | Status: DC

## 2018-02-07 NOTE — Progress Notes (Signed)
Subjective:   Kathy Pacheco is a 85 y.o. female who presents for Medicare Annual (Subsequent) preventive examination.  Review of Systems:  N/A  Cardiac Risk Factors include: advanced age (>56men, >28 women);dyslipidemia;hypertension     Objective:     Vitals: BP 120/68 (BP Location: Right Arm)   Pulse 73   Temp 97.8 F (36.6 C) (Oral)   Ht 5\' 3"  (1.6 m)   Wt 159 lb (72.1 kg)   BMI 28.17 kg/m   Body mass index is 28.17 kg/m.  Advanced Directives 02/07/2018 01/08/2017 04/24/2016 03/10/2016 11/04/2015 11/04/2014  Does Patient Have a Medical Advance Directive? No No No No No No  Would patient like information on creating a medical advance directive? No - Patient declined Yes (Inpatient - patient requests chaplain consult to create a medical advance directive) - No - Patient declined - -    Tobacco Social History   Tobacco Use  Smoking Status Never Smoker  Smokeless Tobacco Never Used     Counseling given: Not Answered   Clinical Intake:  Pre-visit preparation completed: Yes  Pain : No/denies pain Pain Score: 0-No pain     Nutritional Status: BMI 25 -29 Overweight Nutritional Risks: None Diabetes: No  How often do you need to have someone help you when you read instructions, pamphlets, or other written materials from your doctor or pharmacy?: 1 - Never  Interpreter Needed?: No  Information entered by :: [facility], LPN  Past Medical History:  Diagnosis Date  . Anxiety   . GERD (gastroesophageal reflux disease)   . High cholesterol   . Hypercholesteremia   . Hypertension   . PONV (postoperative nausea and vomiting)    Past Surgical History:  Procedure Laterality Date  . CATARACT EXTRACTION Bilateral   . ETHMOIDECTOMY Bilateral 03/10/2016   Procedure: ETHMOIDECTOMY;  Surgeon: Beverly Gust, MD;  Location: [facility];  Service: ENT;  Laterality: Bilateral;  . FRONTAL SINUS EXPLORATION Bilateral 03/10/2016   Procedure: FRONTAL WITH TISSUE REMOVAL;   Surgeon: Beverly Gust, MD;  Location: [facility];  Service: ENT;  Laterality: Bilateral;  . HERNIA REPAIR    . IMAGE GUIDED SINUS SURGERY Bilateral 03/10/2016   Procedure: IMAGE GUIDED SINUS SURGERY;  Surgeon: Beverly Gust, MD;  Location: [facility];  Service: ENT;  Laterality: Bilateral;  Need Disk GAVE DISK TO CECE 1-25 KP  . MAXILLARY ANTROSTOMY Bilateral 03/10/2016   Procedure: MAXILLARY ANTROSTOMY;  Surgeon: Beverly Gust, MD;  Location: [facility] [place];  Service: ENT;  Laterality: Bilateral;  . NASAL TURBINATE REDUCTION Bilateral 03/10/2016   Procedure: TURBINATE REDUCTION/SUBMUCOSAL RESECTION;  Surgeon: Beverly Gust, MD;  Location: [facility];  Service: ENT;  Laterality: Bilateral;  . SEPTOPLASTY Bilateral 03/10/2016   Procedure: SEPTOPLASTY;  Surgeon: Beverly Gust, MD;  Location: [facility];  Service: ENT;  Laterality: Bilateral;  . SPHENOIDECTOMY Bilateral 03/10/2016   Procedure: SPHENOIDECTOMY WITH TISSUE REMOVAL;  Surgeon: Beverly Gust, MD;  Location: [facility];  Service: ENT;  Laterality: Bilateral;   Family History  Problem Relation Age of Onset  . Epilepsy Mother   . Osteoporosis Mother   . COPD Father   . Hypertension Father   . CVA Father   . Allergies Father   . Lung cancer Father   . Emphysema Father   . Cancer Sister        breast  . Breast cancer Sister 2  . CVA Brother   . COPD Brother   . Heart disease Brother   .  Arthritis Brother   . CVA Brother   . Cancer Brother        cancer  . Healthy Brother   . Cancer Maternal Grandmother   . Hypertension Maternal Grandmother   . Healthy Brother   . Osteoporosis Sister   . COPD Sister    Social History   Socioeconomic History  . Marital status: Widowed    Spouse name: Not on file  . Number of children: 1  . Years of education: Not on file  . Highest education level: High school graduate  Occupational History  . Occupation: retired  Scientific laboratory technician    . Financial resource strain: Not hard at all  . Food insecurity:    Worry: Never true    Inability: Never true  . Transportation needs:    Medical: No    Non-medical: No  Tobacco Use  . Smoking status: Never Smoker  . Smokeless tobacco: Never Used  Substance and Sexual Activity  . Alcohol use: No    Alcohol/week: 0.0 standard drinks  . Drug use: No  . Sexual activity: Not on file  Lifestyle  . Physical activity:    Days per week: 0 days    Minutes per session: 0 min  . Stress: Not at all  Relationships  . Social connections:    Talks on phone: Patient refused    Gets together: Patient refused    Attends religious service: Patient refused    Active member of club or organization: Patient refused    Attends meetings of clubs or organizations: Patient refused    Relationship status: Patient refused  Other Topics Concern  . Not on file  Social History Narrative  . Not on file    Outpatient Encounter Medications as of 02/07/2018  Medication Sig  . acetaminophen (TYLENOL) 500 MG tablet Take 500 mg by mouth every 6 (six) hours as needed for mild pain.   Marland Kitchen albuterol (PROVENTIL HFA;VENTOLIN HFA) 108 (90 Base) MCG/ACT inhaler Inhale into the lungs every 6 (six) hours as needed for wheezing or shortness of breath.  Marland Kitchen amLODipine (NORVASC) 5 MG tablet TAKE 1 TABLET (5 MG TOTAL) BY MOUTH DAILY.  . busPIRone (BUSPAR) 30 MG tablet TAKE 1 TABLET BY MOUTH EVERY DAY  . calcium carbonate (OS-CAL) 600 MG TABS tablet Take 600 mg by mouth. Takes occasionally  . cetirizine (ZYRTEC ALLERGY) 10 MG tablet Take 1 tablet (10 mg total) by mouth daily.  . citalopram (CELEXA) 10 MG tablet TAKE 1 TABLET (10 MG TOTAL) BY MOUTH DAILY.  Marland Kitchen FLOVENT HFA 110 MCG/ACT inhaler Inhale 2 puffs into the lungs 2 (two) times daily.  . fluticasone (FLONASE) 50 MCG/ACT nasal spray 2 SPRAYS EACH NOSTRIL TWICE DAILY  . omeprazole (PRILOSEC) 40 MG capsule Take 40 mg by mouth daily.  . rosuvastatin (CRESTOR) 5 MG tablet  Take 1 tablet (5 mg total) daily by mouth.   No facility-administered encounter medications on file as of 02/07/2018.     Activities of Daily Living In your present state of health, do you have any difficulty performing the following activities: 02/07/2018  Hearing? Y  Comment Wears bilateral hearing aids.   Vision? N  Comment Wears contacts.   Difficulty concentrating or making decisions? N  Walking or climbing stairs? N  Dressing or bathing? N  Doing errands, shopping? N  Preparing Food and eating ? N  Using the Toilet? N  In the past six months, have you accidently leaked urine? N  Do you have problems  with loss of bowel control? N  Managing your Medications? N  Managing your Finances? N  Housekeeping or managing your Housekeeping? N  Some recent data might be hidden    Patient Care Team: Jerrol Banana., MD as PCP - General (Family Medicine) Beverly Gust, MD (Otolaryngology) Estill Cotta, MD (Ophthalmology)    Assessment:   This is a routine wellness examination for Jearline.  Exercise Activities and Dietary recommendations Current Exercise Habits: The patient does not participate in regular exercise at present, Exercise limited by: None identified  Goals    . Weight (lb) < 140 lb (63.5 kg)     Recommend to cut out all sweets and sugars in daily diet to help aid in weight loss. Also cut back on portion sizes.        Fall Risk Fall Risk  02/07/2018 12/25/2017 12/14/2016 11/04/2015 05/04/2015  Falls in the past year? 0 0 No Yes No  Comment - Emmi Telephone Survey: data to providers prior to load - - -  Number falls in past yr: 0 - - - -  Injury with Fall? 0 - - - -   FALL RISK PREVENTION PERTAINING TO THE HOME:  Any stairs in or around the home WITH handrails? No  Home free of loose throw rugs in walkways, pet beds, electrical cords, etc? Yes  Adequate lighting in your home to reduce risk of falls? Yes   ASSISTIVE DEVICES UTILIZED TO PREVENT  FALLS:  Life alert? Yes  Use of a cane, walker or w/c? No  Grab bars in the bathroom? No  Shower chair or bench in shower? No  Elevated toilet seat or a handicapped toilet? Yes    TIMED UP AND GO:  Was the test performed? No .    Depression Screen PHQ 2/9 Scores 02/07/2018 12/14/2016 11/04/2015 05/04/2015  PHQ - 2 Score 0 0 0 1     Cognitive Function     6CIT Screen 02/07/2018  What Year? 0 points  What month? 0 points  What time? 0 points  Count back from 20 0 points  Months in reverse 0 points  Repeat phrase 0 points  Total Score 0    Immunization History  Administered Date(s) Administered  . Influenza Split 12/02/2005, 11/19/2010, 12/02/2011  . Influenza, High Dose Seasonal PF 10/28/2013, 11/04/2014, 11/04/2015, 11/22/2016, 11/21/2017  . Influenza,inj,Quad PF,6+ Mos 11/02/2012  . Pneumococcal Conjugate-13 08/04/2013  . Pneumococcal Polysaccharide-23 05/02/2009  . Td 09/10/2007    Qualifies for Shingles Vaccine? Yes . Due for Shingrix. Education has been provided regarding the importance of this vaccine. Pt has been advised to call insurance company to determine out of pocket expense. Advised may also receive vaccine at local pharmacy or Health Dept. Verbalized acceptance and understanding.  Tdap: Although this vaccine is not a covered service during a Wellness Exam, does the patient still wish to receive this vaccine today?  No .  Education has been provided regarding the importance of this vaccine. Advised may receive this vaccine at local pharmacy or Health Dept. Aware to provide a copy of the vaccination record if obtained from local pharmacy or Health Dept. Verbalized acceptance and understanding.  Flu Vaccine: Up to date  Pneumococcal Vaccine: Up to date   Screening Tests Health Maintenance  Topic Date Due  . DEXA SCAN  03/11/2017  . TETANUS/TDAP  09/09/2017  . INFLUENZA VACCINE  Completed  . PNA vac Low Risk Adult  Completed    Cancer  Screenings:  Colorectal Screening:  No longer required.   Mammogram: No longer required.   Bone Density: Completed 03/11/12. Results reflect OSTEOPENIA. Repeat every 5 years. Ordered today. Pt aware the office will call re: appt.  Lung Cancer Screening: (Low Dose CT Chest recommended if Age 63-80 years, 30 pack-year currently smoking OR have quit w/in 15years.) does not qualify.    Additional Screening:  Vision Screening: Recommended annual ophthalmology exams for early detection of glaucoma and other disorders of the eye.  Dental Screening: Recommended annual dental exams for proper oral hygiene  Community Resource Referral:  CRR required this visit?  No       Plan:  I have personally reviewed and addressed the Medicare Annual Wellness questionnaire and have noted the following in the patient's chart:  A. Medical and social history B. Use of alcohol, tobacco or illicit drugs  C. Current medications and supplements D. Functional ability and status E.  Nutritional status F.  Physical activity G. Advance directives H. List of other physicians I.  Hospitalizations, surgeries, and ER visits in previous 12 months J.  Mantoloking such as hearing and vision if needed, cognitive and depression L. Referrals and appointments - none  In addition, I have reviewed and discussed with patient certain preventive protocols, quality metrics, and best practice recommendations. A written personalized care plan for preventive services as well as general preventive health recommendations were provided to patient.  See attached scanned questionnaire for additional information.   Signed,  Fabio Neighbors, LPN Nurse Health Advisor   Nurse Recommendations: Pt declined the tetanus vaccine today.

## 2018-02-07 NOTE — Patient Instructions (Addendum)
Kathy Pacheco , Thank you for taking time to come for your Medicare Wellness Visit. I appreciate your ongoing commitment to your health goals. Please review the following plan we discussed and let me know if I can assist you in the future.   Screening recommendations/referrals: Colonoscopy: No longer required.  Mammogram: No longer required.  Bone Density: Ordered today.  Recommended yearly ophthalmology/optometry visit for glaucoma screening and checkup Recommended yearly dental visit for hygiene and checkup  Vaccinations: Influenza vaccine: Up to date Pneumococcal vaccine: Completed series Tdap vaccine: Pt declines today.  Shingles vaccine: Pt declines today.     Advanced directives: Advance directive discussed with you today. Even though you declined this today please call our office should you change your mind and we can give you the proper paperwork for you to fill out.  Conditions/risks identified: Recommend to cut out all sweets and sugars in daily diet to help aid in weight loss. Also cut back on portion sizes.   Next appointment: 10:20 AM today with Dr Rosanna Randy.    Preventive Care 83 Years and Older, Female Preventive care refers to lifestyle choices and visits with your health care provider that can promote health and wellness. What does preventive care include?  A yearly physical exam. This is also called an annual well check.  Dental exams once or twice a year.  Routine eye exams. Ask your health care provider how often you should have your eyes checked.  Personal lifestyle choices, including:  Daily care of your teeth and gums.  Regular physical activity.  Eating a healthy diet.  Avoiding tobacco and drug use.  Limiting alcohol use.  Practicing safe sex.  Taking low-dose aspirin every day.  Taking vitamin and mineral supplements as recommended by your health care provider. What happens during an annual well check? The services and screenings done by your  health care provider during your annual well check will depend on your age, overall health, lifestyle risk factors, and family history of disease. Counseling  Your health care provider may ask you questions about your:  Alcohol use.  Tobacco use.  Drug use.  Emotional well-being.  Home and relationship well-being.  Sexual activity.  Eating habits.  History of falls.  Memory and ability to understand (cognition).  Work and work Statistician.  Reproductive health. Screening  You may have the following tests or measurements:  Height, weight, and BMI.  Blood pressure.  Lipid and cholesterol levels. These may be checked every 5 years, or more frequently if you are over 95 years old.  Skin check.  Lung cancer screening. You may have this screening every year starting at age 37 if you have a 30-pack-year history of smoking and currently smoke or have quit within the past 83 years.  Fecal occult blood test (FOBT) of the stool. You may have this test every year starting at age 45.  Flexible sigmoidoscopy or colonoscopy. You may have a sigmoidoscopy every 5 years or a colonoscopy every 10 years starting at age 34.  Hepatitis C blood test.  Hepatitis B blood test.  Sexually transmitted disease (STD) testing.  Diabetes screening. This is done by checking your blood sugar (glucose) after you have not eaten for a while (fasting). You may have this done every 1-3 years.  Bone density scan. This is done to screen for osteoporosis. You may have this done starting at age 30.  Mammogram. This may be done every 1-2 years. Talk to your health care provider about how often you  should have regular mammograms. Talk with your health care provider about your test results, treatment options, and if necessary, the need for more tests. Vaccines  Your health care provider may recommend certain vaccines, such as:  Influenza vaccine. This is recommended every year.  Tetanus, diphtheria, and  acellular pertussis (Tdap, Td) vaccine. You may need a Td booster every 10 years.  Zoster vaccine. You may need this after age 69.  Pneumococcal 13-valent conjugate (PCV13) vaccine. One dose is recommended after age 32.  Pneumococcal polysaccharide (PPSV23) vaccine. One dose is recommended after age 67. Talk to your health care provider about which screenings and vaccines you need and how often you need them. This information is not intended to replace advice given to you by your health care provider. Make sure you discuss any questions you have with your health care provider. Document Released: 02/19/2015 Document Revised: 10/13/2015 Document Reviewed: 11/24/2014 Elsevier Interactive Patient Education  2017 [place] Prevention in the Home Falls can cause injuries. They can happen to people of all ages. There are many things you can do to make your home safe and to help prevent falls. What can I do on the outside of my home?  Regularly fix the edges of walkways and driveways and fix any cracks.  Remove anything that might make you trip as you walk through a door, such as a raised step or threshold.  Trim any bushes or trees on the path to your home.  Use bright outdoor lighting.  Clear any walking paths of anything that might make someone trip, such as rocks or tools.  Regularly check to see if handrails are loose or broken. Make sure that both sides of any steps have handrails.  Any raised decks and porches should have guardrails on the edges.  Have any leaves, snow, or ice cleared regularly.  Use sand or salt on walking paths during winter.  Clean up any spills in your garage right away. This includes oil or grease spills. What can I do in the bathroom?  Use night lights.  Install grab bars by the toilet and in the tub and shower. Do not use towel bars as grab bars.  Use non-skid mats or decals in the tub or shower.  If you need to sit down in the shower, use  a plastic, non-slip stool.  Keep the floor dry. Clean up any water that spills on the floor as soon as it happens.  Remove soap buildup in the tub or shower regularly.  Attach bath mats securely with double-sided non-slip rug tape.  Do not have throw rugs and other things on the floor that can make you trip. What can I do in the bedroom?  Use night lights.  Make sure that you have a light by your bed that is easy to reach.  Do not use any sheets or blankets that are too big for your bed. They should not hang down onto the floor.  Have a firm chair that has side arms. You can use this for support while you get dressed.  Do not have throw rugs and other things on the floor that can make you trip. What can I do in the kitchen?  Clean up any spills right away.  Avoid walking on wet floors.  Keep items that you use a lot in easy-to-reach places.  If you need to reach something above you, use a strong step stool that has a grab bar.  Keep electrical cords out  of the way.  Do not use floor polish or wax that makes floors slippery. If you must use wax, use non-skid floor wax.  Do not have throw rugs and other things on the floor that can make you trip. What can I do with my stairs?  Do not leave any items on the stairs.  Make sure that there are handrails on both sides of the stairs and use them. Fix handrails that are broken or loose. Make sure that handrails are as long as the stairways.  Check any carpeting to make sure that it is firmly attached to the stairs. Fix any carpet that is loose or worn.  Avoid having throw rugs at the top or bottom of the stairs. If you do have throw rugs, attach them to the floor with carpet tape.  Make sure that you have a light switch at the top of the stairs and the bottom of the stairs. If you do not have them, ask someone to add them for you. What else can I do to help prevent falls?  Wear shoes that:  Do not have high heels.  Have  rubber bottoms.  Are comfortable and fit you well.  Are closed at the toe. Do not wear sandals.  If you use a stepladder:  Make sure that it is fully opened. Do not climb a closed stepladder.  Make sure that both sides of the stepladder are locked into place.  Ask someone to hold it for you, if possible.  Clearly mark and make sure that you can see:  Any grab bars or handrails.  First and last steps.  Where the edge of each step is.  Use tools that help you move around (mobility aids) if they are needed. These include:  Canes.  Walkers.  Scooters.  Crutches.  Turn on the lights when you go into a dark area. Replace any light bulbs as soon as they burn out.  Set up your furniture so you have a clear path. Avoid moving your furniture around.  If any of your floors are uneven, fix them.  If there are any pets around you, be aware of where they are.  Review your medicines with your doctor. Some medicines can make you feel dizzy. This can increase your chance of falling. Ask your doctor what other things that you can do to help prevent falls. This information is not intended to replace advice given to you by your health care provider. Make sure you discuss any questions you have with your health care provider. Document Released: 11/19/2008 Document Revised: 07/01/2015 Document Reviewed: 02/27/2014 Elsevier Interactive Patient Education  2017 [employer].

## 2018-02-07 NOTE — Progress Notes (Signed)
Patient: Kathy Pacheco, Female    DOB: 1931/09/19, 85 y.o.   MRN: [id number] Visit Date: 02/07/2018  Today's Provider: [name], MD   Chief Complaint  Patient presents with  . Medicare Wellness   Subjective:     Review of Medical Problems Kathy Pacheco is a 85 y.o. female. She feels well. She reports exercising daily walking. She reports she is sleeping fairly well.  Last Reported Mammogram- 11/23/15 (bi rads Category 1 negative) Bone Density- Order placed in chart (status pending) Colonoscopy 06/01/2003 Prevnar 13-06/29/2015 Pneum 23- 05/02/2009 Td- 09/10/2007 -----------------------------------------------------------   Review of Systems  Constitutional: Negative.   HENT: Positive for hearing loss.   Eyes: Negative.   Respiratory: Positive for shortness of breath.   Cardiovascular: Negative.   Gastrointestinal: Negative.   Endocrine: Negative.   Genitourinary: Negative.   Musculoskeletal: Negative.   Skin: Negative.   Allergic/Immunologic: Negative.   Neurological: Negative.   Hematological: Negative.   Psychiatric/Behavioral: Negative.     Social History   Socioeconomic History  . Marital status: Widowed    Spouse name: Not on file  . Number of children: 1  . Years of education: Not on file  . Highest education level: High school graduate  Occupational History  . Occupation: retired  Scientific laboratory technician  . Financial resource strain: Not hard at all  . Food insecurity:    Worry: Never true    Inability: Never true  . Transportation needs:    Medical: No    Non-medical: No  Tobacco Use  . Smoking status: Never Smoker  . Smokeless tobacco: Never Used  Substance and Sexual Activity  . Alcohol use: No    Alcohol/week: 0.0 standard drinks  . Drug use: No  . Sexual activity: Not on file  Lifestyle  . Physical activity:    Days per week: 0 days    Minutes per session: 0 min  . Stress: Not at all  Relationships  . Social connections:   Talks on phone: Patient refused    Gets together: Patient refused    Attends religious service: Patient refused    Active member of club or organization: Patient refused    Attends meetings of clubs or organizations: Patient refused    Relationship status: Patient refused  . Intimate partner violence:    Fear of current or ex partner: Patient refused    Emotionally abused: Patient refused    Physically abused: Patient refused    Forced sexual activity: Patient refused  Other Topics Concern  . Not on file  Social History Narrative  . Not on file    Past Medical History:  Diagnosis Date  . Anxiety   . GERD (gastroesophageal reflux disease)   . High cholesterol   . Hypercholesteremia   . Hypertension   . PONV (postoperative nausea and vomiting)      Patient Active Problem List   Diagnosis Date Noted  . Syncope 01/07/2017  . UTI (urinary tract infection) 01/07/2017  . Absolute anemia 11/03/2014  . Anxiety 11/03/2014  . Bilateral cataracts 11/03/2014  . COPD, mild ([place]) 11/03/2014  . Acute cystitis 11/03/2014  . 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 Esophagitis, reflux 11/03/2014  . Anxiety, generalized 11/03/2014  . Acid reflux 11/03/2014  . Hypercholesteremia 11/03/2014  . HLD (hyperlipidemia) 11/03/2014  . BP (high blood pressure) 11/03/2014  . Affective disorder, major 11/03/2014  . Menopausal and perimenopausal disorder 11/03/2014  . Obstructive apnea 11/03/2014  . Awareness  of heartbeats 11/03/2014  . Solitary pulmonary nodule 11/03/2014  . Hypercholesterolemia without hypertriglyceridemia 11/03/2014  . Allergic rhinitis, seasonal 11/03/2014  . H/O cataract extraction 11/03/2014  . Apnea, sleep 12/16/2013    Past Surgical History:  Procedure Laterality Date  . CATARACT EXTRACTION Bilateral   . ETHMOIDECTOMY Bilateral 03/10/2016   Procedure: ETHMOIDECTOMY;  Surgeon: Beverly Gust, MD;  Location: [facility];  Service: ENT;  Laterality: Bilateral;  .  FRONTAL SINUS EXPLORATION Bilateral 03/10/2016   Procedure: FRONTAL WITH TISSUE REMOVAL;  Surgeon: Beverly Gust, MD;  Location: [facility];  Service: ENT;  Laterality: Bilateral;  . HERNIA REPAIR    . IMAGE GUIDED SINUS SURGERY Bilateral 03/10/2016   Procedure: IMAGE GUIDED SINUS SURGERY;  Surgeon: Beverly Gust, MD;  Location: [facility];  Service: ENT;  Laterality: Bilateral;  Need Disk GAVE DISK TO CECE 1-25 KP  . MAXILLARY ANTROSTOMY Bilateral 03/10/2016   Procedure: MAXILLARY ANTROSTOMY;  Surgeon: Beverly Gust, MD;  Location: [facility];  Service: ENT;  Laterality: Bilateral;  . NASAL TURBINATE REDUCTION Bilateral 03/10/2016   Procedure: TURBINATE REDUCTION/SUBMUCOSAL RESECTION;  Surgeon: Beverly Gust, MD;  Location: [facility];  Service: ENT;  Laterality: Bilateral;  . SEPTOPLASTY Bilateral 03/10/2016   Procedure: SEPTOPLASTY;  Surgeon: Beverly Gust, MD;  Location: [place] [facility];  Service: ENT;  Laterality: Bilateral;  . SPHENOIDECTOMY Bilateral 03/10/2016   Procedure: SPHENOIDECTOMY WITH TISSUE REMOVAL;  Surgeon: Beverly Gust, MD;  Location: [facility];  Service: ENT;  Laterality: Bilateral;    Her family history includes Allergies in her father; Arthritis in her brother; Breast cancer (age of onset: 52) in her sister; COPD in her brother, father, and sister; CVA in her brother, brother, and father; Cancer in her brother, maternal grandmother, and sister; Emphysema in her father; Epilepsy in her mother; Healthy in her brother and brother; Heart disease in her brother; Hypertension in her father and maternal grandmother; Lung cancer in her father; Osteoporosis in her mother and sister.      Current Outpatient Medications:  .  acetaminophen (TYLENOL) 500 MG tablet, Take 500 mg by mouth every 6 (six) hours as needed for mild pain. , Disp: , Rfl:  .  albuterol (PROVENTIL HFA;VENTOLIN HFA) 108 (90 Base) MCG/ACT inhaler, Inhale into the  lungs every 6 (six) hours as needed for wheezing or shortness of breath., Disp: , Rfl:  .  amLODipine (NORVASC) 5 MG tablet, TAKE 1 TABLET (5 MG TOTAL) BY MOUTH DAILY., Disp: 90 tablet, Rfl: 3 .  busPIRone (BUSPAR) 30 MG tablet, TAKE 1 TABLET BY MOUTH EVERY DAY, Disp: 90 tablet, Rfl: 1 .  calcium carbonate (OS-CAL) 600 MG TABS tablet, Take 600 mg by mouth. Takes occasionally, Disp: , Rfl:  .  cetirizine (ZYRTEC ALLERGY) 10 MG tablet, Take 1 tablet (10 mg total) by mouth daily., Disp: 30 tablet, Rfl: 6 .  citalopram (CELEXA) 10 MG tablet, TAKE 1 TABLET (10 MG TOTAL) BY MOUTH DAILY., Disp: 90 tablet, Rfl: 3 .  FLOVENT HFA 110 MCG/ACT inhaler, Inhale 2 puffs into the lungs 2 (two) times daily., Disp: , Rfl: 6 .  fluticasone (FLONASE) 50 MCG/ACT nasal spray, 2 SPRAYS EACH NOSTRIL TWICE DAILY, Disp: , Rfl: 9 .  omeprazole (PRILOSEC) 40 MG capsule, Take 40 mg by mouth daily., Disp: , Rfl: 16 .  rosuvastatin (CRESTOR) 5 MG tablet, Take 1 tablet (5 mg total) daily by mouth., Disp: 90 tablet, Rfl: 3  Patient Care Team: Jerrol Banana., MD as PCP -  General (Family Medicine) Beverly Gust, MD (Otolaryngology) Dingeldein, Remo Lipps, MD (Ophthalmology)     Objective:   Vitals: Physical Exam Constitutional:      Appearance: She is well-developed.  HENT:     Head: Normocephalic and atraumatic.  Eyes:     General: No scleral icterus.    Conjunctiva/sclera: Conjunctivae normal.  Neck:     Thyroid: No thyromegaly.  Cardiovascular:     Rate and Rhythm: Normal rate and regular rhythm.     Heart sounds: Normal heart sounds.  Pulmonary:     Effort: Pulmonary effort is normal.     Breath sounds: Normal breath sounds.  Abdominal:     Palpations: Abdomen is soft.  Skin:    General: Skin is warm and dry.  Neurological:     Mental Status: She is alert and oriented to person, place, and time.  Psychiatric:        Behavior: Behavior normal.        Thought Content: Thought content normal.         Judgment: Judgment normal.     Activities of Daily Living In your present state of health, do you have any difficulty performing the following activities: 02/07/2018  Hearing? Y  Comment Wears bilateral hearing aids.   Vision? N  Comment Wears contacts.   Difficulty concentrating or making decisions? N  Walking or climbing stairs? N  Dressing or bathing? N  Doing errands, shopping? N  Preparing Food and eating ? N  Using the Toilet? N  In the past six months, have you accidently leaked urine? N  Do you have problems with loss of bowel control? N  Managing your Medications? N  Managing your Finances? N  Housekeeping or managing your Housekeeping? N  Some recent data might be hidden    Fall Risk Assessment Fall Risk  02/07/2018 12/25/2017 12/14/2016 11/04/2015 05/04/2015  Falls in the past year? 0 0 No Yes No  Comment - [facility] Telephone Survey: data to providers prior to load - - -  Number falls in past yr: 0 - - - -  Injury with Fall? 0 - - - -     Depression Screen PHQ 2/9 Scores 02/07/2018 12/14/2016 11/04/2015 05/04/2015  PHQ - 2 Score 0 0 0 1    6CIT Screen 02/07/2018  What Year? 0 points  What month? 0 points  What time? 0 points  Count back from 20 0 points  Months in reverse 0 points  Repeat phrase 0 points  Total Score 0      Assessment & Plan:    Review of medical problems Reviewed patient's Family Medical History Reviewed and updated list of patient's medical providers Assessment of cognitive impairment was done Assessed patient's functional ability Established a written schedule for health screening [place] Completed and Reviewed  Exercise Activities and Dietary recommendations Goals    . Weight (lb) < 140 lb (63.5 kg)     Recommend to cut out all sweets and sugars in daily diet to help aid in weight loss. Also cut back on portion sizes.        Immunization History  Administered Date(s) Administered  . Influenza Split 12/02/2005,  11/19/2010, 12/02/2011  . Influenza, High Dose Seasonal PF 10/28/2013, 11/04/2014, 11/04/2015, 11/22/2016, 11/21/2017  . Influenza,inj,Quad PF,6+ Mos 11/02/2012  . Pneumococcal Conjugate-13 08/04/2013  . Pneumococcal Polysaccharide-23 05/02/2009  . Td 09/10/2007    Health Maintenance  Topic Date Due  . DEXA SCAN  03/11/2017  . TETANUS/TDAP  09/09/2017  . INFLUENZA VACCINE  Completed  . PNA vac Low Risk Adult  Completed     Discussed health benefits of physical activity, and encouraged her to engage in regular exercise appropriate for her age and condition.  1. Essential hypertension  - Comprehensive metabolic panel - CBC with Differential/Platelet - TSH  2. Hypercholesteremia  - Lipid panel - rosuvastatin (CRESTOR) 5 MG tablet; Take 1 tablet (5 mg total) by mouth daily.  Dispense: 90 tablet; Refill: 3  3. Encounter for Medicare annual wellness exam Done earlier. - Lipid panel - Comprehensive metabolic panel - CBC with Differential/Platelet - TSH  4. Breast cancer screening   5. Gastroesophageal reflux disease without esophagitis Controlled.  6. Anxiety, generalized Controlled.  7. Recurrent major depressive disorder, in partial remission ([place]) Fairly well controlled.  8. Menopausal and perimenopausal disorder     ------------------------------------------------------------------------------------------------------------   I have done the exam and reviewed the above chart and it is accurate to the best of my knowledge. Development worker, community has been used in this note in any air is in the dictation or transcription are unintentional.  [name], MD  [place]

## 2018-02-11 DIAGNOSIS — Z0000 Encounter for general adult medical examination without abnormal findings: Secondary | ICD-10-CM | POA: Diagnosis not present

## 2018-02-11 DIAGNOSIS — I10 Essential (primary) hypertension: Secondary | ICD-10-CM | POA: Diagnosis not present

## 2018-02-11 DIAGNOSIS — E7800 Pure hypercholesterolemia, unspecified: Secondary | ICD-10-CM | POA: Diagnosis not present

## 2018-02-12 LAB — CBC WITH DIFFERENTIAL/PLATELET
BASOS: 2 %
Basophils Absolute: 0.1 10*3/uL (ref 0.0–0.2)
EOS (ABSOLUTE): 0.4 10*3/uL (ref 0.0–0.4)
EOS: 7 %
HEMATOCRIT: 35.6 % (ref 34.0–46.6)
Hemoglobin: 11.4 g/dL (ref 11.1–15.9)
IMMATURE GRANS (ABS): 0 10*3/uL (ref 0.0–0.1)
IMMATURE GRANULOCYTES: 0 %
LYMPHS: 28 %
Lymphocytes Absolute: 1.6 10*3/uL (ref 0.7–3.1)
MCH: 27.2 pg (ref 26.6–33.0)
MCHC: 32 g/dL (ref 31.5–35.7)
MCV: 85 fL (ref 79–97)
Monocytes Absolute: 0.8 10*3/uL (ref 0.1–0.9)
Monocytes: 13 %
NEUTROS ABS: 3 10*3/uL (ref 1.4–7.0)
NEUTROS PCT: 50 %
Platelets: 341 10*3/uL (ref 150–450)
RBC: 4.19 x10E6/uL (ref 3.77–5.28)
RDW: 14 % (ref 11.7–15.4)
WBC: 5.9 10*3/uL (ref 3.4–10.8)

## 2018-02-12 LAB — TSH: TSH: 1.2 u[IU]/mL (ref 0.450–4.500)

## 2018-02-12 LAB — LIPID PANEL
CHOLESTEROL TOTAL: 190 mg/dL (ref 100–199)
Chol/HDL Ratio: 3.6 ratio (ref 0.0–4.4)
HDL: 53 mg/dL (ref >39)
LDL Calculated: 117 mg/dL — ABNORMAL HIGH (ref 0–99)
TRIGLYCERIDES: 99 mg/dL (ref 0–149)
VLDL Cholesterol Cal: 20 mg/dL (ref 5–40)

## 2018-02-12 LAB — COMPREHENSIVE METABOLIC PANEL
A/G RATIO: 1.5 (ref 1.2–2.2)
ALT: 10 IU/L (ref 0–32)
AST: 12 IU/L (ref 0–40)
Albumin: 4 g/dL (ref 3.5–4.7)
Alkaline Phosphatase: 99 IU/L (ref 39–117)
BUN/Creatinine Ratio: 18 (ref 12–28)
BUN: 20 mg/dL (ref 8–27)
Bilirubin Total: 0.4 mg/dL (ref 0.0–1.2)
CALCIUM: 9.7 mg/dL (ref 8.7–10.3)
CO2: 24 mmol/L (ref 20–29)
CREATININE: 1.09 mg/dL — AB (ref 0.57–1.00)
Chloride: 102 mmol/L (ref 96–106)
GFR, EST AFRICAN AMERICAN: 54 mL/min/{1.73_m2} — AB (ref >59)
GFR, EST NON AFRICAN AMERICAN: 47 mL/min/{1.73_m2} — AB (ref >59)
GLOBULIN, TOTAL: 2.7 g/dL (ref 1.5–4.5)
Glucose: 91 mg/dL (ref 65–99)
POTASSIUM: 5 mmol/L (ref 3.5–5.2)
SODIUM: 142 mmol/L (ref 134–144)
TOTAL PROTEIN: 6.7 g/dL (ref 6.0–8.5)

## 2018-02-13 ENCOUNTER — Telehealth: Payer: Self-pay

## 2018-02-13 NOTE — Telephone Encounter (Signed)
[facility] 

## 2018-02-13 NOTE — Telephone Encounter (Signed)
-----   Message from Jerrol Banana., MD sent at 02/13/2018  8:24 AM EST ----- Labs stable.

## 2018-02-13 NOTE — Telephone Encounter (Signed)
Advised patient of results.  

## 2018-02-14 ENCOUNTER — Other Ambulatory Visit: Payer: Self-pay | Admitting: Family Medicine

## 2018-02-14 DIAGNOSIS — F411 Generalized anxiety disorder: Secondary | ICD-10-CM

## 2018-04-03 ENCOUNTER — Ambulatory Visit
Admission: RE | Admit: 2018-04-03 | Discharge: 2018-04-03 | Disposition: A | Discharge status: Home / Self Care | Payer: Medicare Other | Source: Ambulatory Visit | Attending: Family Medicine | Admitting: Family Medicine

## 2018-04-03 DIAGNOSIS — Z780 Asymptomatic menopausal state: Secondary | ICD-10-CM | POA: Diagnosis not present

## 2018-04-03 DIAGNOSIS — E2839 Other primary ovarian failure: Secondary | ICD-10-CM | POA: Insufficient documentation

## 2018-04-03 DIAGNOSIS — M810 Age-related osteoporosis without current pathological fracture: Secondary | ICD-10-CM | POA: Diagnosis not present

## 2018-04-04 ENCOUNTER — Other Ambulatory Visit: Payer: Self-pay | Admitting: Family Medicine

## 2018-04-04 MED ORDER — ALENDRONATE SODIUM 70 MG PO TABS: 70.0000 mg | ORAL_TABLET | ORAL | 11 refills | Status: DC

## 2018-05-28 ENCOUNTER — Ambulatory Visit: Payer: Self-pay | Admitting: *Deleted

## 2018-05-28 NOTE — Chronic Care Management (AMB) (Signed)
  Chronic Care Management   Outreach Note  06/16/2583 Name: Kathy Pacheco MRN: [id number] DOB: 09-10-1931  Referred by: Jerrol Banana., MD Reason for referral : Chronic Care Management (Initial Outreach CCM unsuccessful)   An unsuccessful telephone outreach was attempted today. The patient was referred to the case management team by the patient's health plan.   Follow Up Plan: The CM team will reach out to the patient again over the next 7 days.    [facility] Care Management [phone]

## 2018-07-25 DIAGNOSIS — Z961 Presence of intraocular lens: Secondary | ICD-10-CM | POA: Diagnosis not present

## 2018-08-03 ENCOUNTER — Other Ambulatory Visit: Payer: Self-pay | Admitting: Family Medicine

## 2018-08-05 DIAGNOSIS — E7801 Familial hypercholesterolemia: Secondary | ICD-10-CM | POA: Diagnosis not present

## 2018-08-05 DIAGNOSIS — F419 Anxiety disorder, unspecified: Secondary | ICD-10-CM | POA: Diagnosis not present

## 2018-08-05 DIAGNOSIS — G4733 Obstructive sleep apnea (adult) (pediatric): Secondary | ICD-10-CM | POA: Diagnosis not present

## 2018-08-05 DIAGNOSIS — I10 Essential (primary) hypertension: Secondary | ICD-10-CM | POA: Diagnosis not present

## 2018-08-08 ENCOUNTER — Encounter: Payer: Self-pay | Admitting: Family Medicine

## 2018-08-08 ENCOUNTER — Ambulatory Visit (INDEPENDENT_AMBULATORY_CARE_PROVIDER_SITE_OTHER): Payer: Medicare Other | Admitting: Family Medicine

## 2018-08-08 ENCOUNTER — Other Ambulatory Visit: Payer: Self-pay

## 2018-08-08 VITALS — BP 134/70 | HR 68 | Temp 98.1°F | Resp 16 | Ht 63.0 in | Wt 152.0 lb

## 2018-08-08 DIAGNOSIS — R911 Solitary pulmonary nodule: Secondary | ICD-10-CM | POA: Diagnosis not present

## 2018-08-08 DIAGNOSIS — I10 Essential (primary) hypertension: Secondary | ICD-10-CM | POA: Diagnosis not present

## 2018-08-08 DIAGNOSIS — F411 Generalized anxiety disorder: Secondary | ICD-10-CM | POA: Diagnosis not present

## 2018-08-08 DIAGNOSIS — K219 Gastro-esophageal reflux disease without esophagitis: Secondary | ICD-10-CM

## 2018-08-08 NOTE — Progress Notes (Signed)
Patient: Kathy Pacheco Female    DOB: [birth year]   85 y.o.   MRN: [id number] Visit Date: 08/08/2018  Today's Provider: [name], MD   Chief Complaint  Patient presents with  . Hypertension  . Hyperlipidemia  . Anxiety   Subjective:    HPI  Hypertension, follow-up:  BP Readings from Last 3 Encounters:  08/08/18 134/70  02/07/18 120/68  02/07/18 120/68    She was last seen for hypertension 6 months ago.  BP at that visit was 134/70. Management since that visit includes no changes. She reports good compliance with treatment. She is not having side effects.  She is not exercising. She is adherent to low salt diet.   Outside blood pressures are not being checked. She is experiencing none.  Patient denies exertional chest pressure/discomfort, lower extremity edema and palpitations.   Cardiovascular risk factors include dyslipidemia.   Weight trend: stable Wt Readings from Last 3 Encounters:  08/08/18 152 lb (68.9 kg)  02/07/18 159 lb (72.1 kg)  02/07/18 159 lb (72.1 kg)    Current diet: well balanced   Lipid/Cholesterol, Follow-up:   Last seen for this6 months ago.  Management changes since that visit include no changes. . Last Lipid Panel:    Component Value Date/Time   CHOL 190 02/11/2018 0903   TRIG 99 02/11/2018 0903   HDL 53 02/11/2018 0903   CHOLHDL 3.6 02/11/2018 0903   LDLCALC 117 (H) 02/11/2018 0903    Risk factors for vascular disease include hypertension  She reports good compliance with treatment. She is not having side effects.  Current symptoms include none and have been stable.  Anxiety, follow up: Patient was last seen in the office 6 months ago. No medications were changed at that time. She is currently taking citalopram 10mg  daily, and reports good compliance and good symptom control.   Allergies  Allergen Reactions  . Ciprofloxacin Itching     Current Outpatient Medications:  .  acetaminophen (TYLENOL) 500 MG  tablet, Take 500 mg by mouth every 6 (six) hours as needed for mild pain. , Disp: , Rfl:  .  albuterol (PROVENTIL HFA;VENTOLIN HFA) 108 (90 Base) MCG/ACT inhaler, Inhale into the lungs every 6 (six) hours as needed for wheezing or shortness of breath., Disp: , Rfl:  .  alendronate (FOSAMAX) 70 MG tablet, Take 1 tablet (70 mg total) by mouth every 7 (seven) days. Take with a full glass of water on an empty stomach., Disp: 4 tablet, Rfl: 11 .  amLODipine (NORVASC) 5 MG tablet, TAKE 1 TABLET (5 MG TOTAL) BY MOUTH DAILY., Disp: 90 tablet, Rfl: 3 .  busPIRone (BUSPAR) 30 MG tablet, TAKE 1 TABLET BY MOUTH EVERY DAY, Disp: 90 tablet, Rfl: 1 .  calcium carbonate (OS-CAL) 600 MG TABS tablet, Take 600 mg by mouth. Takes occasionally, Disp: , Rfl:  .  cetirizine (ZYRTEC ALLERGY) 10 MG tablet, Take 1 tablet (10 mg total) by mouth daily., Disp: 30 tablet, Rfl: 6 .  citalopram (CELEXA) 10 MG tablet, TAKE 1 TABLET (10 MG TOTAL) BY MOUTH DAILY., Disp: 90 tablet, Rfl: 3 .  FLOVENT HFA 110 MCG/ACT inhaler, Inhale 2 puffs into the lungs 2 (two) times daily., Disp: , Rfl: 6 .  fluticasone (FLONASE) 50 MCG/ACT nasal spray, 2 SPRAYS EACH NOSTRIL TWICE DAILY, Disp: , Rfl: 9 .  omeprazole (PRILOSEC) 40 MG capsule, Take 40 mg by mouth daily., Disp: , Rfl: 16 .  rosuvastatin (CRESTOR) 5 MG tablet,  Take 1 tablet (5 mg total) by mouth daily., Disp: 90 tablet, Rfl: 3  Review of Systems  Constitutional: Negative for activity change, appetite change, diaphoresis, fatigue and fever.  Respiratory: Negative for cough and shortness of breath.   Cardiovascular: Negative for chest pain, palpitations and leg swelling.  Musculoskeletal: Negative for arthralgias, gait problem, joint swelling and myalgias.  Allergic/Immunologic: Negative for environmental allergies.  Psychiatric/Behavioral: Negative for agitation, self-injury, sleep disturbance and suicidal ideas. The patient is not nervous/anxious.     Social History   Tobacco Use   . Smoking status: Never Smoker  . Smokeless tobacco: Never Used  Substance Use Topics  . Alcohol use: No    Alcohol/week: 0.0 standard drinks      Objective:   BP 134/70   Pulse 68   Temp 98.1 F (36.7 C)   Resp 16   Ht 5\' 3"  (1.6 m)   Wt 152 lb (68.9 kg)   SpO2 98%   BMI 26.93 kg/m  Vitals:   08/08/18 0937  BP: 134/70  Pulse: 68  Resp: 16  Temp: 98.1 F (36.7 C)  SpO2: 98%  Weight: 152 lb (68.9 kg)  Height: 5\' 3"  (1.6 m)     Physical Exam Vitals signs reviewed.  Constitutional:      Appearance: She is well-developed.  HENT:     Head: Normocephalic and atraumatic.  Eyes:     General: No scleral icterus.    Conjunctiva/sclera: Conjunctivae normal.  Neck:     Thyroid: No thyromegaly.  Cardiovascular:     Rate and Rhythm: Normal rate and regular rhythm.     Heart sounds: Normal heart sounds.  Pulmonary:     Effort: Pulmonary effort is normal.     Breath sounds: Normal breath sounds.  Abdominal:     Palpations: Abdomen is soft.  Skin:    General: Skin is warm and dry.  Neurological:     Mental Status: She is alert and oriented to person, place, and time.  Psychiatric:        Behavior: Behavior normal.        Thought Content: Thought content normal.        Judgment: Judgment normal.      No results found for any visits on 08/08/18.     Assessment & Plan    1. Essential hypertension Controlled.  2. Gastroesophageal reflux disease without esophagitis   3. Solitary pulmonary nodule   4. Anxiety, generalized On Celexa.     Richard Cranford Mon, MD  [facility]

## 2018-08-12 ENCOUNTER — Other Ambulatory Visit: Payer: Self-pay | Admitting: Family Medicine

## 2018-08-12 DIAGNOSIS — F411 Generalized anxiety disorder: Secondary | ICD-10-CM

## 2018-10-16 ENCOUNTER — Telehealth: Payer: Self-pay | Admitting: Family Medicine

## 2018-10-16 NOTE — Telephone Encounter (Signed)
Pt has been very fatigued lately with no other symptoms.  Needing a call back to see if she can come in the office to see Dr. Rosanna Randy.  She doesn't want to do a telephone or a virtual visit.  Please advise.  Thanks, [employer]

## 2018-10-17 NOTE — Telephone Encounter (Signed)
Patient state that she is feeling much better today. No longer wants an appointment.

## 2018-10-28 ENCOUNTER — Other Ambulatory Visit: Payer: Self-pay | Admitting: Family Medicine

## 2018-10-30 DIAGNOSIS — Z23 Encounter for immunization: Secondary | ICD-10-CM | POA: Diagnosis not present

## 2018-12-17 ENCOUNTER — Encounter: Payer: Self-pay | Admitting: Adult Health

## 2018-12-17 ENCOUNTER — Ambulatory Visit: Admitting: Adult Health

## 2018-12-17 ENCOUNTER — Ambulatory Visit (INDEPENDENT_AMBULATORY_CARE_PROVIDER_SITE_OTHER): Payer: Medicare Other | Admitting: Adult Health

## 2018-12-17 ENCOUNTER — Other Ambulatory Visit: Payer: Self-pay

## 2018-12-17 DIAGNOSIS — R053 Chronic cough: Secondary | ICD-10-CM | POA: Insufficient documentation

## 2018-12-17 DIAGNOSIS — R05 Cough: Secondary | ICD-10-CM

## 2018-12-17 DIAGNOSIS — K219 Gastro-esophageal reflux disease without esophagitis: Secondary | ICD-10-CM | POA: Diagnosis not present

## 2018-12-17 DIAGNOSIS — J302 Other seasonal allergic rhinitis: Secondary | ICD-10-CM

## 2018-12-17 MED ORDER — FLOVENT HFA 110 MCG/ACT IN AERO: 2.0000 | INHALATION_SPRAY | Freq: Two times a day (BID) | RESPIRATORY_TRACT | 6 refills | Status: DC

## 2018-12-17 MED ORDER — ALBUTEROL SULFATE HFA 108 (90 BASE) MCG/ACT IN AERS: 2.0000 | INHALATION_SPRAY | Freq: Four times a day (QID) | RESPIRATORY_TRACT | 6 refills | Status: DC | PRN

## 2018-12-17 NOTE — Patient Instructions (Signed)
May use Flovent 1 puff Twice daily  , rinse after use. - restart this if your cough returns.  Use Zyrtec 10mg  At bedtime  As needed  Drainage  Use Flonase 2 puffs daily As needed  Drainage.  Ventolin inhaler 2 puffs every 6hr as needed for wheezing-this is your rescue inhaler.  Follow up with Dr. Mortimer Fries in 1 year and As needed

## 2018-12-17 NOTE — Assessment & Plan Note (Signed)
Chronic cough/reactive airways-patient seems to be doing very well.  She has no active cough or wheezing.  Patient education on trigger prevention with chronic rhinitis and GERD.  Previously cough improved substantially with Flovent may have a component of mild intermittent asthma/reactive airways.  Advised to restart Flovent if her cough returns.  Plan  Patient Instructions  May use Flovent 1 puff Twice daily  , rinse after use. - restart this if your cough returns.  Use Zyrtec 10mg  At bedtime  As needed  Drainage  Use Flonase 2 puffs daily As needed  Drainage.  Ventolin inhaler 2 puffs every 6hr as needed for wheezing-this is your rescue inhaler.  Follow up with Dr. Mortimer Fries in 1 year and As needed

## 2018-12-17 NOTE — Assessment & Plan Note (Signed)
Continue on GERD diet.  Continue on Prilosec daily

## 2018-12-17 NOTE — Assessment & Plan Note (Signed)
Use Flonase and Zyrtec as needed

## 2018-12-17 NOTE — Assessment & Plan Note (Deleted)
Continue on GERD diet.  Continue on Prilosec daily

## 2018-12-17 NOTE — Progress Notes (Signed)
@Patient  ID: Kathy Pacheco, female    DOB: 1931-03-09, 85 y.o.   MRN: [id number]  Chief Complaint  Patient presents with  . Follow-up    Cough     Referring provider: Jerrol Banana.,*  HPI: 85 year old female never smoker followed for chronic cough and chronic bronchitis  TEST/EVENTS :  Office spirometry August 22, 2017 -FEV1 93% ratio 71, FVC 97%   Swallow evaluation did not show any significant abnormalities  12/17/2018 Follow up : Chronic Cough  Patient returns for a 1 year follow-up.  Patient is followed for chronic cough and chronic bronchitis.  She uses Flovent as needed.  Is on Flonase and Zyrtec as needed.  She says since last visit she is doing well.  She has an occasional cough but feels overall that her cough has been doing well. Feels breathing is doing much better with less cough . Wheezing has resolved.  Needs refill of Flovent.  No albuterol use.  Remains active. Able to do housework. Mows yard. Independent , drives, lives alone.     Allergies  Allergen Reactions  . Ciprofloxacin Itching    Immunization History  Administered Date(s) Administered  . Influenza Split 12/02/2005, 11/19/2010, 12/02/2011  . Influenza, High Dose Seasonal PF 10/28/2013, 11/04/2014, 11/04/2015, 11/22/2016, 11/21/2017, 10/30/2018  . Influenza,inj,Quad PF,6+ Mos 11/02/2012  . Pneumococcal Conjugate-13 08/04/2013  . Pneumococcal Polysaccharide-23 05/02/2009  . Td 09/10/2007    Past Medical History:  Diagnosis Date  . Anxiety   . GERD (gastroesophageal reflux disease)   . High cholesterol   . Hypercholesteremia   . Hypertension   . PONV (postoperative nausea and vomiting)     Tobacco History: Social History   Tobacco Use  Smoking Status Never Smoker  Smokeless Tobacco Never Used   Counseling given: Not Answered   Outpatient Medications Prior to Visit  Medication Sig Dispense Refill  . acetaminophen (TYLENOL) 500 MG tablet Take 500 mg by mouth every 6 (six)  hours as needed for mild pain.     Marland Kitchen alendronate (FOSAMAX) 70 MG tablet Take 1 tablet (70 mg total) by mouth every 7 (seven) days. Take with a full glass of water on an empty stomach. 4 tablet 11  . amLODipine (NORVASC) 5 MG tablet TAKE 1 TABLET (5 MG TOTAL) BY MOUTH DAILY. 90 tablet 3  . busPIRone (BUSPAR) 30 MG tablet TAKE 1 TABLET BY MOUTH EVERY DAY 90 tablet 1  . calcium carbonate (OS-CAL) 600 MG TABS tablet Take 600 mg by mouth. Takes occasionally    . cetirizine (ZYRTEC ALLERGY) 10 MG tablet Take 1 tablet (10 mg total) by mouth daily. 30 tablet 6  . citalopram (CELEXA) 10 MG tablet TAKE 1 TABLET (10 MG TOTAL) BY MOUTH DAILY. 90 tablet 3  . fluticasone (FLONASE) 50 MCG/ACT nasal spray 2 SPRAYS EACH NOSTRIL TWICE DAILY  9  . omeprazole (PRILOSEC) 40 MG capsule Take 40 mg by mouth daily.  16  . rosuvastatin (CRESTOR) 5 MG tablet Take 1 tablet (5 mg total) by mouth daily. 90 tablet 3  . albuterol (PROVENTIL HFA;VENTOLIN HFA) 108 (90 Base) MCG/ACT inhaler Inhale into the lungs every 6 (six) hours as needed for wheezing or shortness of breath.    Cristy Friedlander HFA 110 MCG/ACT inhaler Inhale 2 puffs into the lungs 2 (two) times daily.  6   No facility-administered medications prior to visit.      Review of Systems:   Constitutional:   No  weight loss, night sweats,  Fevers,  chills,  +fatigue, or  lassitude.  HEENT:   No headaches,  Difficulty swallowing,  Tooth/dental problems, or  Sore throat,                No sneezing, itching, ear ache, nasal congestion, post nasal drip,   CV:  No chest pain,  Orthopnea, PND, swelling in lower extremities, anasarca, dizziness, palpitations, syncope.   GI  No heartburn, indigestion, abdominal pain, nausea, vomiting, diarrhea, change in bowel habits, loss of appetite, bloody stools.   Resp  No excess mucus, no productive cough,     No coughing up of blood.  No change in color of mucus.  No wheezing.  No chest wall deformity  Skin: no rash or lesions.   GU: no dysuria, change in color of urine, no urgency or frequency.  No flank pain, no hematuria   MS:  No joint pain or swelling.  No decreased range of motion.  No back pain.    Physical Exam  BP 130/80 (BP Location: Left Arm, Cuff Size: Normal)   Pulse 73   Temp (!) 97.5 F (36.4 C) (Temporal)   Ht 5\' 3"  (1.6 m)   Wt 148 lb 12.8 oz (67.5 kg)   SpO2 95%   BMI 26.36 kg/m   GEN: A/Ox3; pleasant , NAD, elderly  HEENT:  [place]/AT,  , NOSE-clear, THROAT-clear, no lesions, no postnasal drip or exudate noted.   NECK:  Supple w/ fair ROM; no JVD; normal carotid impulses w/o bruits; no thyromegaly or nodules palpated; no lymphadenopathy.    RESP  Clear  P & A; w/o, wheezes/ rales/ or rhonchi. no accessory muscle use, no dullness to percussion  CARD:  RRR, no m/r/g, no peripheral edema, pulses intact, no cyanosis or clubbing.  GI:   Soft & nt; nml bowel sounds; no organomegaly or masses detected.   Musco: Warm bil, no deformities or joint swelling noted.   Neuro: alert, no focal deficits noted.    Skin: Warm, no lesions or rashes    Lab Results:  BNP No results found for: BNP  ProBNP No results found for: PROBNP  Imaging: No results found.    No flowsheet data found.  No results found for: NITRICOXIDE      Assessment & Plan:   Chronic cough Chronic cough/reactive airways-patient seems to be doing very well.  She has no active cough or wheezing.  Patient education on trigger prevention with chronic rhinitis and GERD.  Previously cough improved substantially with Flovent may have a component of mild intermittent asthma/reactive airways.  Advised to restart Flovent if her cough returns.  Plan  Patient Instructions  May use Flovent 1 puff Twice daily  , rinse after use. - restart this if your cough returns.  Use Zyrtec 10mg  At bedtime  As needed  Drainage  Use Flonase 2 puffs daily As needed  Drainage.  Ventolin inhaler 2 puffs every 6hr as needed for  wheezing-this is your rescue inhaler.  Follow up with Dr. Mortimer Fries in 1 year and As needed       Allergic rhinitis, seasonal Use Flonase and Zyrtec as needed  Acid reflux Continue on GERD diet.  Continue on Prilosec daily     Rexene Edison, NP 12/17/2018

## 2018-12-25 NOTE — Progress Notes (Signed)
     Patient: Kathy Pacheco Female    DOB: 09/16/1931   85 y.o.   MRN: [id number] Visit Date: 12/26/2018  Today's Provider: Richard Gilbert Jr, MD   Chief Complaint  Patient presents with  . Follow-up  . Hypertension   Subjective:    The patient is here for a 6 Month Follow up and is not having any additional symptoms other than Fatigue at times.   Essential hypertension From 08/08/2018-Controlled.  Gastroesophageal reflux disease without esophagitis From 08/08/2018-no changes.  Solitary pulmonary nodule From 08/08/2018-no changes.   Anxiety, generalized From 08/08/2018-On Celexa  Allergies  Allergen Reactions  . Ciprofloxacin Itching     Current Outpatient Medications:  .  acetaminophen (TYLENOL) 500 MG tablet, Take 500 mg by mouth every 6 (six) hours as needed for mild pain. , Disp: , Rfl:  .  albuterol (VENTOLIN HFA) 108 (90 Base) MCG/ACT inhaler, Inhale 2 puffs into the lungs every 6 (six) hours as needed for wheezing or shortness of breath., Disp: 18 g, Rfl: 6 .  amLODipine (NORVASC) 5 MG tablet, TAKE 1 TABLET (5 MG TOTAL) BY MOUTH DAILY., Disp: 90 tablet, Rfl: 3 .  busPIRone (BUSPAR) 30 MG tablet, TAKE 1 TABLET BY MOUTH EVERY DAY, Disp: 90 tablet, Rfl: 1 .  calcium carbonate (OS-CAL) 600 MG TABS tablet, Take 600 mg by mouth. Takes occasionally, Disp: , Rfl:  .  FLOVENT HFA 110 MCG/ACT inhaler, Inhale 2 puffs into the lungs 2 (two) times daily., Disp: 1 Inhaler, Rfl: 6 .  fluticasone (FLONASE) 50 MCG/ACT nasal spray, 2 SPRAYS EACH NOSTRIL TWICE DAILY, Disp: , Rfl: 9 .  omeprazole (PRILOSEC) 40 MG capsule, Take 40 mg by mouth daily., Disp: , Rfl: 16 .  rosuvastatin (CRESTOR) 5 MG tablet, Take 1 tablet (5 mg total) by mouth daily., Disp: 90 tablet, Rfl: 3 .  cetirizine (ZYRTEC ALLERGY) 10 MG tablet, Take 1 tablet (10 mg total) by mouth daily. (Patient not taking: Reported on 12/26/2018), Disp: 30 tablet, Rfl: 6 .  citalopram (CELEXA) 10 MG tablet, TAKE 1 TABLET (10  MG TOTAL) BY MOUTH DAILY. (Patient not taking: Reported on 12/26/2018), Disp: 90 tablet, Rfl: 3  Review of Systems  Constitutional: Positive for fatigue. Negative for appetite change, chills and fever.  HENT: Negative.   Eyes: Negative.   Respiratory: Negative for chest tightness and shortness of breath.   Cardiovascular: Negative for chest pain and palpitations.  Gastrointestinal: Negative for abdominal pain, nausea and vomiting.  Endocrine: Negative.   Allergic/Immunologic: Negative.   Neurological: Negative for dizziness and weakness.  Hematological: Negative.   Psychiatric/Behavioral: Negative.     Social History   Tobacco Use  . Smoking status: Never Smoker  . Smokeless tobacco: Never Used  Substance Use Topics  . Alcohol use: No    Alcohol/week: 0.0 standard drinks      Objective:   BP (!) 144/78   Pulse 88   Temp 97.6 F (36.4 C) (Temporal)   Resp 16   Ht 5' 3" (1.6 m)   Wt 150 lb (68 kg)   BMI 26.57 kg/m  Vitals:   12/26/18 1034  BP: (!) 144/78  Pulse: 88  Resp: 16  Temp: 97.6 F (36.4 C)  TempSrc: Temporal  Weight: 150 lb (68 kg)  Height: 5' 3" (1.6 m)  Body mass index is 26.57 kg/m.   Physical Exam Vitals signs reviewed.  Constitutional:      Appearance: She is well-developed.  HENT:       Head: Normocephalic and atraumatic.  Eyes:     General: No scleral icterus.    Conjunctiva/sclera: Conjunctivae normal.  Neck:     Thyroid: No thyromegaly.  Cardiovascular:     Rate and Rhythm: Normal rate and regular rhythm.     Heart sounds: Normal heart sounds.  Pulmonary:     Effort: Pulmonary effort is normal.     Breath sounds: Normal breath sounds.  Abdominal:     Palpations: Abdomen is soft.  Skin:    General: Skin is warm and dry.  Neurological:     Mental Status: She is alert and oriented to person, place, and time.  Psychiatric:        Behavior: Behavior normal.        Thought Content: Thought content normal.        Judgment: Judgment  normal.      No results found for any visits on 12/26/18.     Assessment & Plan     1. Essential hypertension On amlodipine. - CBC with Diff - Comp Met (CMET) - Lipid Profile - TSH  2. Other hyperlipidemia On crestor. - CBC with Diff - Comp Met (CMET) - Lipid Profile - TSH  3. Anxiety, generalized Stable. - CBC with Diff - Comp Met (CMET) - Lipid Profile - TSH  4. Chronic GERD Asymptomatic. - CBC with Diff - Comp Met (CMET) - Lipid Profile - TSH      Richard Cranford Mon, MD  [facility]

## 2018-12-26 ENCOUNTER — Encounter: Payer: Self-pay | Admitting: Family Medicine

## 2018-12-26 ENCOUNTER — Other Ambulatory Visit: Payer: Self-pay

## 2018-12-26 ENCOUNTER — Ambulatory Visit (INDEPENDENT_AMBULATORY_CARE_PROVIDER_SITE_OTHER): Payer: Medicare Other | Admitting: Family Medicine

## 2018-12-26 VITALS — BP 144/78 | HR 88 | Temp 97.6°F | Resp 16 | Ht 63.0 in | Wt 150.0 lb

## 2018-12-26 DIAGNOSIS — F411 Generalized anxiety disorder: Secondary | ICD-10-CM

## 2018-12-26 DIAGNOSIS — E7849 Other hyperlipidemia: Secondary | ICD-10-CM | POA: Diagnosis not present

## 2018-12-26 DIAGNOSIS — K219 Gastro-esophageal reflux disease without esophagitis: Secondary | ICD-10-CM | POA: Diagnosis not present

## 2018-12-26 DIAGNOSIS — I10 Essential (primary) hypertension: Secondary | ICD-10-CM | POA: Diagnosis not present

## 2018-12-31 DIAGNOSIS — F411 Generalized anxiety disorder: Secondary | ICD-10-CM | POA: Diagnosis not present

## 2018-12-31 DIAGNOSIS — I10 Essential (primary) hypertension: Secondary | ICD-10-CM | POA: Diagnosis not present

## 2018-12-31 DIAGNOSIS — E7849 Other hyperlipidemia: Secondary | ICD-10-CM | POA: Diagnosis not present

## 2018-12-31 DIAGNOSIS — K219 Gastro-esophageal reflux disease without esophagitis: Secondary | ICD-10-CM | POA: Diagnosis not present

## 2019-01-01 LAB — CBC WITH DIFFERENTIAL/PLATELET
Basophils Absolute: 0.1 10*3/uL (ref 0.0–0.2)
Basos: 1 %
EOS (ABSOLUTE): 0.6 10*3/uL — ABNORMAL HIGH (ref 0.0–0.4)
Eos: 8 %
Hematocrit: 37.1 % (ref 34.0–46.6)
Hemoglobin: 11.6 g/dL (ref 11.1–15.9)
Immature Grans (Abs): 0 10*3/uL (ref 0.0–0.1)
Immature Granulocytes: 0 %
Lymphocytes Absolute: 2.1 10*3/uL (ref 0.7–3.1)
Lymphs: 29 %
MCH: 27 pg (ref 26.6–33.0)
MCHC: 31.3 g/dL — ABNORMAL LOW (ref 31.5–35.7)
MCV: 86 fL (ref 79–97)
Monocytes Absolute: 0.9 10*3/uL (ref 0.1–0.9)
Monocytes: 12 %
Neutrophils Absolute: 3.6 10*3/uL (ref 1.4–7.0)
Neutrophils: 50 %
Platelets: 368 10*3/uL (ref 150–450)
RBC: 4.3 x10E6/uL (ref 3.77–5.28)
RDW: 14.2 % (ref 11.7–15.4)
WBC: 7.2 10*3/uL (ref 3.4–10.8)

## 2019-01-01 LAB — COMPREHENSIVE METABOLIC PANEL
ALT: 11 IU/L (ref 0–32)
AST: 13 IU/L (ref 0–40)
Albumin/Globulin Ratio: 1.5 (ref 1.2–2.2)
Albumin: 4 g/dL (ref 3.6–4.6)
Alkaline Phosphatase: 103 IU/L (ref 39–117)
BUN/Creatinine Ratio: 13 (ref 12–28)
BUN: 14 mg/dL (ref 8–27)
Bilirubin Total: 0.3 mg/dL (ref 0.0–1.2)
CO2: 22 mmol/L (ref 20–29)
Calcium: 9.6 mg/dL (ref 8.7–10.3)
Chloride: 104 mmol/L (ref 96–106)
Creatinine, Ser: 1.04 mg/dL — ABNORMAL HIGH (ref 0.57–1.00)
GFR calc Af Amer: 57 mL/min/{1.73_m2} — ABNORMAL LOW (ref >59)
GFR calc non Af Amer: 49 mL/min/{1.73_m2} — ABNORMAL LOW (ref >59)
Globulin, Total: 2.7 g/dL (ref 1.5–4.5)
Glucose: 97 mg/dL (ref 65–99)
Potassium: 4.4 mmol/L (ref 3.5–5.2)
Sodium: 140 mmol/L (ref 134–144)
Total Protein: 6.7 g/dL (ref 6.0–8.5)

## 2019-01-01 LAB — LIPID PANEL
Chol/HDL Ratio: 5.8 ratio — ABNORMAL HIGH (ref 0.0–4.4)
Cholesterol, Total: 253 mg/dL — ABNORMAL HIGH (ref 100–199)
HDL: 44 mg/dL (ref >39)
LDL Chol Calc (NIH): 183 mg/dL — ABNORMAL HIGH (ref 0–99)
Triglycerides: 144 mg/dL (ref 0–149)
VLDL Cholesterol Cal: 26 mg/dL (ref 5–40)

## 2019-01-01 LAB — TSH: TSH: 0.975 u[IU]/mL (ref 0.450–4.500)

## 2019-01-07 ENCOUNTER — Telehealth: Payer: Self-pay

## 2019-01-07 NOTE — Telephone Encounter (Signed)
Called and LVM about lab results on [phone] per DPR.

## 2019-01-07 NOTE — Telephone Encounter (Signed)
-----   Message from Jerrol Banana., MD sent at 01/07/2019  8:50 AM EST ----- Labs stable.  Cholesterol high.  Work on diet and exercise.

## 2019-02-06 ENCOUNTER — Telehealth: Payer: Self-pay

## 2019-02-13 NOTE — Progress Notes (Addendum)
Subjective:   Kathy Pacheco is a 86 y.o. female who presents for Medicare Annual (Subsequent) preventive examination.       Review of Systems:  N/A  Cardiac Risk Factors include: advanced age (>32men, >27 women);dyslipidemia;hypertension     Objective:     Vitals: BP 138/72 (BP Location: Right Arm)   Pulse 76   Temp 97.9 F (36.6 C) (Oral)   Wt 145 lb (65.8 kg)   BMI 25.69 kg/m   Body mass index is 25.69 kg/m.   Advanced Directives 02/17/2019 02/07/2018 01/08/2017 04/24/2016 03/10/2016 11/04/2015 11/04/2014  Does Patient Have a Medical Advance Directive? No No No No No No No  Would patient like information on creating a medical advance directive? No - Patient declined No - Patient declined Yes (Inpatient - patient requests chaplain consult to create a medical advance directive) - No - Patient declined - -    Tobacco Social History   Tobacco Use  Smoking Status Never Smoker  Smokeless Tobacco Never Used     Counseling given: Not Answered   Clinical Intake:  Pre-visit preparation completed: Yes  Pain : No/denies pain Pain Score: 0-No pain     Nutritional Risks: Nausea/ vomitting/ diarrhea(Nausea only started last night. Pt aware she can schedule an apt with a provider if needed. Declines at this time.) Diabetes: No  How often do you need to have someone help you when you read instructions, pamphlets, or other written materials from your doctor or pharmacy?: 1 - Never  Interpreter Needed?: No  Information entered by :: [facility], LPN  Past Medical History:  Diagnosis Date  . Anxiety   . GERD (gastroesophageal reflux disease)   . High cholesterol   . Hypercholesteremia   . Hypertension   . PONV (postoperative nausea and vomiting)    Past Surgical History:  Procedure Laterality Date  . CATARACT EXTRACTION Bilateral   . ETHMOIDECTOMY Bilateral 03/10/2016   Procedure: ETHMOIDECTOMY;  Surgeon: Beverly Gust, MD;  Location: [place];  Service:  ENT;  Laterality: Bilateral;  . FRONTAL SINUS EXPLORATION Bilateral 03/10/2016   Procedure: FRONTAL WITH TISSUE REMOVAL;  Surgeon: Beverly Gust, MD;  Location: [place];  Service: ENT;  Laterality: Bilateral;  . HERNIA REPAIR    . IMAGE GUIDED SINUS SURGERY Bilateral 03/10/2016   Procedure: IMAGE GUIDED SINUS SURGERY;  Surgeon: Beverly Gust, MD;  Location: [place];  Service: ENT;  Laterality: Bilateral;  Need Disk GAVE DISK TO CECE 1-25 KP  . MAXILLARY ANTROSTOMY Bilateral 03/10/2016   Procedure: MAXILLARY ANTROSTOMY;  Surgeon: Beverly Gust, MD;  Location: [place];  Service: ENT;  Laterality: Bilateral;  . NASAL TURBINATE REDUCTION Bilateral 03/10/2016   Procedure: TURBINATE REDUCTION/SUBMUCOSAL RESECTION;  Surgeon: Beverly Gust, MD;  Location: [place];  Service: ENT;  Laterality: Bilateral;  . SEPTOPLASTY Bilateral 03/10/2016   Procedure: SEPTOPLASTY;  Surgeon: Beverly Gust, MD;  Location: [place];  Service: ENT;  Laterality: Bilateral;  . SPHENOIDECTOMY Bilateral 03/10/2016   Procedure: SPHENOIDECTOMY WITH TISSUE REMOVAL;  Surgeon: Beverly Gust, MD;  Location: [place];  Service: ENT;  Laterality: Bilateral;   Family History  Problem Relation Age of Onset  . Epilepsy Mother   . Osteoporosis Mother   . COPD Father   . Hypertension Father   . CVA Father   . Allergies Father   . Lung cancer Father   . Emphysema Father   . Cancer Sister        breast  .  Breast cancer Sister 52  . CVA Brother   . COPD Brother   . Heart disease Brother   . Arthritis Brother   . CVA Brother   . Cancer Brother        cancer  . Healthy Brother   . Cancer Maternal Grandmother   . Hypertension Maternal Grandmother   . Healthy Brother   . Osteoporosis Sister   . COPD Sister    Social History   Socioeconomic History  . Marital status: Widowed    Spouse name: Not on file  . Number of children: 1  . Years of education: Not  on file  . Highest education level: High school graduate  Occupational History  . Occupation: retired  Tobacco Use  . Smoking status: Never Smoker  . Smokeless tobacco: Never Used  Substance and Sexual Activity  . Alcohol use: No    Alcohol/week: 0.0 standard drinks  . Drug use: No  . Sexual activity: Not on file  Other Topics Concern  . Not on file  Social History Narrative  . Not on file   Social Determinants of Health   Financial Resource Strain: Low Risk   . Difficulty of Paying Living Expenses: Not hard at all  Food Insecurity: No Food Insecurity  . Worried About Charity fundraiser in the Last Year: Never true  . Ran Out of Food in the Last Year: Never true  Transportation Needs: No Transportation Needs  . Lack of Transportation (Medical): No  . Lack of Transportation (Non-Medical): No  Physical Activity: Inactive  . Days of Exercise per Week: 0 days  . Minutes of Exercise per Session: 0 min  Stress: No Stress Concern Present  . Feeling of Stress : Not at all  Social Connections: Slightly Isolated  . Frequency of Communication with Friends and Family: More than three times a week  . Frequency of Social Gatherings with Friends and Family: More than three times a week  . Attends Religious Services: More than 4 times per year  . Active Member of Clubs or Organizations: Yes  . Attends Archivist Meetings: More than 4 times per year  . Marital Status: Widowed    Outpatient Encounter Medications as of 02/17/2019  Medication Sig  . acetaminophen (TYLENOL) 500 MG tablet Take 500 mg by mouth every 6 (six) hours as needed for mild pain.   Marland Kitchen albuterol (VENTOLIN HFA) 108 (90 Base) MCG/ACT inhaler Inhale 2 puffs into the lungs every 6 (six) hours as needed for wheezing or shortness of breath.  Marland Kitchen amLODipine (NORVASC) 5 MG tablet TAKE 1 TABLET (5 MG TOTAL) BY MOUTH DAILY.  . busPIRone (BUSPAR) 30 MG tablet TAKE 1 TABLET BY MOUTH EVERY DAY  . calcium carbonate (OS-CAL)  600 MG TABS tablet Take 600 mg by mouth. Takes occasionally  . citalopram (CELEXA) 10 MG tablet TAKE 1 TABLET (10 MG TOTAL) BY MOUTH DAILY.  Marland Kitchen FLOVENT HFA 110 MCG/ACT inhaler Inhale 2 puffs into the lungs 2 (two) times daily.  . fluticasone (FLONASE) 50 MCG/ACT nasal spray 2 SPRAYS EACH NOSTRIL TWICE DAILY  . omeprazole (PRILOSEC) 40 MG capsule Take 40 mg by mouth daily.  . rosuvastatin (CRESTOR) 5 MG tablet Take 1 tablet (5 mg total) by mouth daily.  . cetirizine (ZYRTEC ALLERGY) 10 MG tablet Take 1 tablet (10 mg total) by mouth daily. (Patient not taking: Reported on 12/26/2018)   No facility-administered encounter medications on file as of 02/17/2019.    Activities of Daily  Living In your present state of health, do you have any difficulty performing the following activities: 02/17/2019  Hearing? Y  Comment Wears bilateral hearing aids.  Vision? N  Difficulty concentrating or making decisions? N  Walking or climbing stairs? N  Dressing or bathing? N  Doing errands, shopping? N  Preparing Food and eating ? N  Using the Toilet? N  In the past six months, have you accidently leaked urine? N  Do you have problems with loss of bowel control? N  Managing your Medications? N  Managing your Finances? N  Housekeeping or managing your Housekeeping? N  Some recent data might be hidden    Patient Care Team: Jerrol Banana., MD as PCP - General (Family Medicine) Beverly Gust, MD (Otolaryngology) Estill Cotta, MD (Ophthalmology)    Assessment:   This is a routine wellness examination for Gricelda.  Exercise Activities and Dietary recommendations Current Exercise Habits: The patient does not participate in regular exercise at present, Exercise limited by: None identified  Goals    . DIET - INCREASE WATER INTAKE     Recommend to drink at least 6-8 8oz glasses of water per day.    . Weight (lb) < 140 lb (63.5 kg)     Recommend to cut out all sweets and sugars in daily diet  to help aid in weight loss. Also cut back on portion sizes.        Fall Risk: Fall Risk  02/17/2019 02/07/2018 12/25/2017 12/14/2016 11/04/2015  Falls in the past year? 0 0 0 No Yes  Comment - - Emmi Telephone Survey: data to providers prior to load - -  Number falls in past yr: 0 0 - - -  Injury with Fall? 0 0 - - -    FALL RISK PREVENTION PERTAINING TO THE HOME:  Any stairs in or around the home? Yes  If so, are there any without handrails? No   Home free of loose throw rugs in walkways, pet beds, electrical cords, etc? Yes  Adequate lighting in your home to reduce risk of falls? Yes   ASSISTIVE DEVICES UTILIZED TO PREVENT FALLS:  Life alert? Yes  Use of a cane, walker or w/c? No  Grab bars in the bathroom? No  Shower chair or bench in shower? No  Elevated toilet seat or a handicapped toilet? Yes    TIMED UP AND GO:  Was the test performed? No .    Depression Screen PHQ 2/9 Scores 02/17/2019 02/17/2019 02/07/2018 12/14/2016  PHQ - 2 Score 0 0 0 0     Cognitive Function: Declined today.      6CIT Screen 02/07/2018  What Year? 0 points  What month? 0 points  What time? 0 points  Count back from 20 0 points  Months in reverse 0 points  Repeat phrase 0 points  Total Score 0    Immunization History  Administered Date(s) Administered  . Influenza Split 12/02/2005, 11/19/2010, 12/02/2011  . Influenza, High Dose Seasonal PF 10/28/2013, 11/04/2014, 11/04/2015, 11/22/2016, 11/21/2017, 10/30/2018  . Influenza,inj,Quad PF,6+ Mos 11/02/2012  . Pneumococcal Conjugate-13 08/04/2013  . Pneumococcal Polysaccharide-23 05/02/2009  . Td 09/10/2007    Qualifies for Shingles Vaccine? Yes . Due for Shingrix. Pt has been advised to call insurance company to determine out of pocket expense. Advised may also receive vaccine at local pharmacy or Health Dept. Verbalized acceptance and understanding.  Tdap: Although this vaccine is not a covered service during a Wellness Exam, does the  patient  still wish to receive this vaccine today?  No . Advised may receive this vaccine at local pharmacy or Health Dept. Aware to provide a copy of the vaccination record if obtained from local pharmacy or Health Dept. Verbalized acceptance and understanding.  Flu Vaccine: Up to date  Pneumococcal Vaccine: Completed series  Screening Tests Health Maintenance  Topic Date Due  . TETANUS/TDAP  02/17/2020 (Originally 09/09/2017)  . DEXA SCAN  04/04/2023  . INFLUENZA VACCINE  Completed  . PNA vac Low Risk Adult  Completed    Cancer Screenings:  Colorectal Screening: No longer required.   Mammogram: No longer required.   Bone Density: Completed 04/03/18. Results reflect OSTEOPENIA. Repeat every 5 years.   Lung Cancer Screening: (Low Dose CT Chest recommended if Age 68-80 years, 30 pack-year currently smoking OR have quit w/in 15years.) does not qualify.   Additional Screening:  Vision Screening: Recommended annual ophthalmology exams for early detection of glaucoma and other disorders of the eye.  Dental Screening: Recommended annual dental exams for proper oral hygiene  Community Resource Referral:  CRR required this visit?  No       Plan:  I have personally reviewed and addressed the Medicare Annual Wellness questionnaire and have noted the following in the patient's chart:  A. Medical and social history B. Use of alcohol, tobacco or illicit drugs  C. Current medications and supplements D. Functional ability and status E.  Nutritional status F.  Physical activity G. Advance directives H. List of other physicians I.  Hospitalizations, surgeries, and ER visits in previous 12 months J.  [place] such as hearing and vision if needed, cognitive and depression L. Referrals and appointments   In addition, I have reviewed and discussed with patient certain preventive protocols, quality metrics, and best practice recommendations. A written personalized care plan for  preventive services as well as general preventive health recommendations were provided to patient. Nurse Health Advisor  Signed,    Lauralye Kinn [place], [place]  [id number] Nurse Health Advisor   Nurse Notes: None.

## 2019-02-17 ENCOUNTER — Ambulatory Visit (INDEPENDENT_AMBULATORY_CARE_PROVIDER_SITE_OTHER): Payer: Medicare Other

## 2019-02-17 ENCOUNTER — Ambulatory Visit (INDEPENDENT_AMBULATORY_CARE_PROVIDER_SITE_OTHER): Payer: Medicare Other | Admitting: Family Medicine

## 2019-02-17 ENCOUNTER — Encounter: Payer: Self-pay | Admitting: Family Medicine

## 2019-02-17 ENCOUNTER — Encounter: Payer: Medicare Other | Admitting: Family Medicine

## 2019-02-17 ENCOUNTER — Other Ambulatory Visit: Payer: Self-pay

## 2019-02-17 VITALS — BP 138/72 | HR 76 | Temp 97.9°F | Ht 63.0 in | Wt 145.0 lb

## 2019-02-17 DIAGNOSIS — Z0000 Encounter for general adult medical examination without abnormal findings: Secondary | ICD-10-CM

## 2019-02-17 DIAGNOSIS — R110 Nausea: Secondary | ICD-10-CM

## 2019-02-17 NOTE — Progress Notes (Signed)
Patient: Kathy Pacheco Female    DOB: 1931/12/08   86 y.o.   MRN: [id number] Visit Date: 02/17/2019  Today's Provider: Lelon Huh, MD   Chief Complaint  Patient presents with  . Nausea    x 1 day   Subjective:     GI Problem The primary symptoms include nausea. Primary symptoms do not include fever, fatigue, abdominal pain or vomiting.  The illness does not include chills.  no changes in bowel habits. No dizziness, no spinning sensation, no vomiting. No back pain, no headaches. Seems slightly better after eating. No change in appetite. Has been belching a little more than usual.   Allergies  Allergen Reactions  . Ciprofloxacin Itching     Current Outpatient Medications:  .  acetaminophen (TYLENOL) 500 MG tablet, Take 500 mg by mouth every 6 (six) hours as needed for mild pain. , Disp: , Rfl:  .  albuterol (VENTOLIN HFA) 108 (90 Base) MCG/ACT inhaler, Inhale 2 puffs into the lungs every 6 (six) hours as needed for wheezing or shortness of breath., Disp: 18 g, Rfl: 6 .  amLODipine (NORVASC) 5 MG tablet, TAKE 1 TABLET (5 MG TOTAL) BY MOUTH DAILY., Disp: 90 tablet, Rfl: 3 .  busPIRone (BUSPAR) 30 MG tablet, TAKE 1 TABLET BY MOUTH EVERY DAY, Disp: 90 tablet, Rfl: 1 .  calcium carbonate (OS-CAL) 600 MG TABS tablet, Take 600 mg by mouth. Takes occasionally, Disp: , Rfl:  .  cetirizine (ZYRTEC ALLERGY) 10 MG tablet, Take 1 tablet (10 mg total) by mouth daily., Disp: 30 tablet, Rfl: 6 .  citalopram (CELEXA) 10 MG tablet, TAKE 1 TABLET (10 MG TOTAL) BY MOUTH DAILY., Disp: 90 tablet, Rfl: 3 .  FLOVENT HFA 110 MCG/ACT inhaler, Inhale 2 puffs into the lungs 2 (two) times daily., Disp: 1 Inhaler, Rfl: 6 .  fluticasone (FLONASE) 50 MCG/ACT nasal spray, 2 SPRAYS EACH NOSTRIL TWICE DAILY, Disp: , Rfl: 9 .  omeprazole (PRILOSEC) 40 MG capsule, Take 40 mg by mouth daily., Disp: , Rfl: 16 .  rosuvastatin (CRESTOR) 5 MG tablet, Take 1 tablet (5 mg total) by mouth daily., Disp: 90 tablet,  Rfl: 3  Review of Systems  Constitutional: Negative.  Negative for appetite change, chills, fatigue and fever.  Respiratory: Negative.  Negative for chest tightness and shortness of breath.   Cardiovascular: Negative.  Negative for chest pain and palpitations.  Gastrointestinal: Positive for nausea. Negative for abdominal pain and vomiting.  Musculoskeletal: Negative.   Neurological: Negative for dizziness and weakness.    Social History   Tobacco Use  . Smoking status: Never Smoker  . Smokeless tobacco: Never Used  Substance Use Topics  . Alcohol use: No    Alcohol/week: 0.0 standard drinks      Objective:   There were no vitals taken for this visit. There were no vitals filed for this visit.There is no height or weight on file to calculate BMI.   BP 138/72 (BP Location: Right Arm)  Pulse 76  Temp 97.9 F (36.6 C) (Oral)  Ht 5\' 3"  (1.6 m)  Wt 145 lb (65.8 kg)  BMI 25.69 kg/m  BSA 1.71 m  Pain [place] 0-No pain    Physical Exam  General Appearance:    Well developed, well nourished female, alert, cooperative, in no acute distress  Eyes:    PERRL, conjunctiva/corneas clear, EOM's intact       Lungs:     Clear to auscultation bilaterally, respirations unlabored  Heart:    Normal heart rate. Normal rhythm. No murmurs, rubs, or gallops.   Abdomen:   bowel sounds present and normal in all 4 quadrants, soft, round, nontender or nondistended. No CVA tenderness         Assessment & Plan    1. Nausea  - Comprehensive metabolic panel - CBC - Amylase     Lelon Huh, MD  [facility]

## 2019-02-17 NOTE — Patient Instructions (Addendum)
Kathy Pacheco , Thank you for taking time to come for your Medicare Wellness Visit. I appreciate your ongoing commitment to your health goals. Please review the following plan we discussed and let me know if I can assist you in the future.   Screening recommendations/referrals: Colonoscopy: No longer required.  Mammogram: No longer required.  Bone Density: Up to date, due 03/2023 Recommended yearly ophthalmology/optometry visit for glaucoma screening and checkup Recommended yearly dental visit for hygiene and checkup  Vaccinations: Influenza vaccine: Up to date Pneumococcal vaccine: Completed series Tdap vaccine: Pt declines today.  Shingles vaccine: Pt declines today.     Advanced directives: Advance directive discussed with you today. Even though you declined this today please call our office should you change your mind and we can give you the proper paperwork for you to fill out.  Conditions/risks identified: Recommend to increase water intake to 6-8 8 oz glasses a day.   Next appointment: 06/25/19 @ 10:00 AM with Dr Rosanna Randy. Declined scheduling an AWV for 2022 at this time.    Preventive Care 3 Years and Older, Female Preventive care refers to lifestyle choices and visits with your health care provider that can promote health and wellness. What does preventive care include?  A yearly physical exam. This is also called an annual well check.  Dental exams once or twice a year.  Routine eye exams. Ask your health care provider how often you should have your eyes checked.  Personal lifestyle choices, including:  Daily care of your teeth and gums.  Regular physical activity.  Eating a healthy diet.  Avoiding tobacco and drug use.  Limiting alcohol use.  Practicing safe sex.  Taking low-dose aspirin every day.  Taking vitamin and mineral supplements as recommended by your health care provider. What happens during an annual well check? The services and screenings done by  your health care provider during your annual well check will depend on your age, overall health, lifestyle risk factors, and family history of disease. Counseling  Your health care provider may ask you questions about your:  Alcohol use.  Tobacco use.  Drug use.  Emotional well-being.  Home and relationship well-being.  Sexual activity.  Eating habits.  History of falls.  Memory and ability to understand (cognition).  Work and work Statistician.  Reproductive health. Screening  You may have the following tests or measurements:  Height, weight, and BMI.  Blood pressure.  Lipid and cholesterol levels. These may be checked every 5 years, or more frequently if you are over 17 years old.  Skin check.  Lung cancer screening. You may have this screening every year starting at age 31 if you have a 30-pack-year history of smoking and currently smoke or have quit within the past 15 years.  Fecal occult blood test (FOBT) of the stool. You may have this test every year starting at age 57.  Flexible sigmoidoscopy or colonoscopy. You may have a sigmoidoscopy every 5 years or a colonoscopy every 10 years starting at age 72.  Hepatitis C blood test.  Hepatitis B blood test.  Sexually transmitted disease (STD) testing.  Diabetes screening. This is done by checking your blood sugar (glucose) after you have not eaten for a while (fasting). You may have this done every 1-3 years.  Bone density scan. This is done to screen for osteoporosis. You may have this done starting at age 34.  Mammogram. This may be done every 1-2 years. Talk to your health care provider about how often  you should have regular mammograms. Talk with your health care provider about your test results, treatment options, and if necessary, the need for more tests. Vaccines  Your health care provider may recommend certain vaccines, such as:  Influenza vaccine. This is recommended every year.  Tetanus,  diphtheria, and acellular pertussis (Tdap, Td) vaccine. You may need a Td booster every 10 years.  Zoster vaccine. You may need this after age 56.  Pneumococcal 13-valent conjugate (PCV13) vaccine. One dose is recommended after age 26.  Pneumococcal polysaccharide (PPSV23) vaccine. One dose is recommended after age 47. Talk to your health care provider about which screenings and vaccines you need and how often you need them. This information is not intended to replace advice given to you by your health care provider. Make sure you discuss any questions you have with your health care provider. Document Released: 02/19/2015 Document Revised: 10/13/2015 Document Reviewed: 11/24/2014 Elsevier Interactive Patient Education  2017 [place] Prevention in the Home Falls can cause injuries. They can happen to people of all ages. There are many things you can do to make your home safe and to help prevent falls. What can I do on the outside of my home?  Regularly fix the edges of walkways and driveways and fix any cracks.  Remove anything that might make you trip as you walk through a door, such as a raised step or threshold.  Trim any bushes or trees on the path to your home.  Use bright outdoor lighting.  Clear any walking paths of anything that might make someone trip, such as rocks or tools.  Regularly check to see if handrails are loose or broken. Make sure that both sides of any steps have handrails.  Any raised decks and porches should have guardrails on the edges.  Have any leaves, snow, or ice cleared regularly.  Use sand or salt on walking paths during winter.  Clean up any spills in your garage right away. This includes oil or grease spills. What can I do in the bathroom?  Use night lights.  Install grab bars by the toilet and in the tub and shower. Do not use towel bars as grab bars.  Use non-skid mats or decals in the tub or shower.  If you need to sit down in  the shower, use a plastic, non-slip stool.  Keep the floor dry. Clean up any water that spills on the floor as soon as it happens.  Remove soap buildup in the tub or shower regularly.  Attach bath mats securely with double-sided non-slip rug tape.  Do not have throw rugs and other things on the floor that can make you trip. What can I do in the bedroom?  Use night lights.  Make sure that you have a light by your bed that is easy to reach.  Do not use any sheets or blankets that are too big for your bed. They should not hang down onto the floor.  Have a firm chair that has side arms. You can use this for support while you get dressed.  Do not have throw rugs and other things on the floor that can make you trip. What can I do in the kitchen?  Clean up any spills right away.  Avoid walking on wet floors.  Keep items that you use a lot in easy-to-reach places.  If you need to reach something above you, use a strong step stool that has a grab bar.  Keep electrical cords  out of the way.  Do not use floor polish or wax that makes floors slippery. If you must use wax, use non-skid floor wax.  Do not have throw rugs and other things on the floor that can make you trip. What can I do with my stairs?  Do not leave any items on the stairs.  Make sure that there are handrails on both sides of the stairs and use them. Fix handrails that are broken or loose. Make sure that handrails are as long as the stairways.  Check any carpeting to make sure that it is firmly attached to the stairs. Fix any carpet that is loose or worn.  Avoid having throw rugs at the top or bottom of the stairs. If you do have throw rugs, attach them to the floor with carpet tape.  Make sure that you have a light switch at the top of the stairs and the bottom of the stairs. If you do not have them, ask someone to add them for you. What else can I do to help prevent falls?  Wear shoes that:  Do not have high  heels.  Have rubber bottoms.  Are comfortable and fit you well.  Are closed at the toe. Do not wear sandals.  If you use a stepladder:  Make sure that it is fully opened. Do not climb a closed stepladder.  Make sure that both sides of the stepladder are locked into place.  Ask someone to hold it for you, if possible.  Clearly mark and make sure that you can see:  Any grab bars or handrails.  First and last steps.  Where the edge of each step is.  Use tools that help you move around (mobility aids) if they are needed. These include:  Canes.  Walkers.  Scooters.  Crutches.  Turn on the lights when you go into a dark area. Replace any light bulbs as soon as they burn out.  Set up your furniture so you have a clear path. Avoid moving your furniture around.  If any of your floors are uneven, fix them.  If there are any pets around you, be aware of where they are.  Review your medicines with your doctor. Some medicines can make you feel dizzy. This can increase your chance of falling. Ask your doctor what other things that you can do to help prevent falls. This information is not intended to replace advice given to you by your health care provider. Make sure you discuss any questions you have with your health care provider. Document Released: 11/19/2008 Document Revised: 07/01/2015 Document Reviewed: 02/27/2014 Elsevier Interactive Patient Education  2017 [employer].

## 2019-02-18 LAB — COMPREHENSIVE METABOLIC PANEL
ALT: 10 IU/L (ref 0–32)
AST: 15 IU/L (ref 0–40)
Albumin/Globulin Ratio: 1.4 (ref 1.2–2.2)
Albumin: 3.9 g/dL (ref 3.6–4.6)
Alkaline Phosphatase: 101 IU/L (ref 39–117)
BUN/Creatinine Ratio: 13 (ref 12–28)
BUN: 12 mg/dL (ref 8–27)
Bilirubin Total: 0.3 mg/dL (ref 0.0–1.2)
CO2: 22 mmol/L (ref 20–29)
Calcium: 9.5 mg/dL (ref 8.7–10.3)
Chloride: 104 mmol/L (ref 96–106)
Creatinine, Ser: 0.9 mg/dL (ref 0.57–1.00)
GFR calc Af Amer: 67 mL/min/{1.73_m2} (ref >59)
GFR calc non Af Amer: 58 mL/min/{1.73_m2} — ABNORMAL LOW (ref >59)
Globulin, Total: 2.8 g/dL (ref 1.5–4.5)
Glucose: 95 mg/dL (ref 65–99)
Potassium: 4.2 mmol/L (ref 3.5–5.2)
Sodium: 139 mmol/L (ref 134–144)
Total Protein: 6.7 g/dL (ref 6.0–8.5)

## 2019-02-18 LAB — CBC
Hematocrit: 35.9 % (ref 34.0–46.6)
Hemoglobin: 11.4 g/dL (ref 11.1–15.9)
MCH: 26.8 pg (ref 26.6–33.0)
MCHC: 31.8 g/dL (ref 31.5–35.7)
MCV: 84 fL (ref 79–97)
Platelets: 375 10*3/uL (ref 150–450)
RBC: 4.26 x10E6/uL (ref 3.77–5.28)
RDW: 14.6 % (ref 11.7–15.4)
WBC: 7.5 10*3/uL (ref 3.4–10.8)

## 2019-02-18 LAB — AMYLASE: Amylase: 54 U/L (ref 31–110)

## 2019-05-06 DIAGNOSIS — J324 Chronic pansinusitis: Secondary | ICD-10-CM | POA: Diagnosis not present

## 2019-05-06 DIAGNOSIS — R1310 Dysphagia, unspecified: Secondary | ICD-10-CM | POA: Diagnosis not present

## 2019-05-07 ENCOUNTER — Emergency Department: Payer: Medicare Other

## 2019-05-07 ENCOUNTER — Other Ambulatory Visit: Payer: Self-pay

## 2019-05-07 ENCOUNTER — Emergency Department
Admission: EM | Admit: 2019-05-07 | Discharge: 2019-05-07 | Disposition: A | Discharge status: Home / Self Care | Payer: Medicare Other | Attending: Student in an Organized Health Care Education/Training Program | Admitting: Student in an Organized Health Care Education/Training Program

## 2019-05-07 DIAGNOSIS — Z79899 Other long term (current) drug therapy: Secondary | ICD-10-CM | POA: Insufficient documentation

## 2019-05-07 DIAGNOSIS — Z20822 Contact with and (suspected) exposure to covid-19: Secondary | ICD-10-CM | POA: Insufficient documentation

## 2019-05-07 DIAGNOSIS — R0989 Other specified symptoms and signs involving the circulatory and respiratory systems: Secondary | ICD-10-CM | POA: Diagnosis not present

## 2019-05-07 DIAGNOSIS — R0981 Nasal congestion: Secondary | ICD-10-CM | POA: Diagnosis not present

## 2019-05-07 DIAGNOSIS — J449 Chronic obstructive pulmonary disease, unspecified: Secondary | ICD-10-CM | POA: Diagnosis not present

## 2019-05-07 DIAGNOSIS — T17920A Food in respiratory tract, part unspecified causing asphyxiation, initial encounter: Secondary | ICD-10-CM | POA: Diagnosis not present

## 2019-05-07 DIAGNOSIS — R05 Cough: Secondary | ICD-10-CM | POA: Diagnosis not present

## 2019-05-07 DIAGNOSIS — R52 Pain, unspecified: Secondary | ICD-10-CM | POA: Diagnosis not present

## 2019-05-07 DIAGNOSIS — I10 Essential (primary) hypertension: Secondary | ICD-10-CM | POA: Diagnosis not present

## 2019-05-07 DIAGNOSIS — R059 Cough, unspecified: Secondary | ICD-10-CM

## 2019-05-07 NOTE — ED Triage Notes (Signed)
Pt states she is having difficult time swallowing due to sinus congestion and drainage. Trying to clear throat constantly with coughing.

## 2019-05-07 NOTE — ED Triage Notes (Signed)
First RN Note: Pt presents to ED via [facility] with c/o sinus drainage x several months, per EMS pt c/o "feeling like she's choking". Pt ambulatory from EMS bay, pt with no respiratory distress noted by EMS. Per EMS lung sounds clear, no stridor, no wheezes.   97% RA

## 2019-05-07 NOTE — ED Provider Notes (Signed)
[facility] Emergency Department Provider Note  ____________________________________________   First MD Initiated Contact with Patient 05/07/19 1648     (approximate)  I have reviewed the triage vital signs and the nursing notes.   HISTORY  Chief Complaint Nasal Congestion and Dysphagia    HPI Kathy Pacheco is a 86 y.o. female presents emergency department stating she has had a difficult time swallowing due to sinus congestion and drainage.  States she got choked on mucus earlier today.  1 episode of vomiting earlier today.  States she is having cough.  No fever or chills.  Patient has had her Covid vaccinations.  She denies any fever    Past Medical History:  Diagnosis Date  . Anxiety   . GERD (gastroesophageal reflux disease)   . High cholesterol   . Hypercholesteremia   . Hypertension   . PONV (postoperative nausea and vomiting)     Patient Active Problem List   Diagnosis Date Noted  . Chronic cough 12/17/2018  . Syncope 01/07/2017  . UTI (urinary tract infection) 01/07/2017  . Absolute anemia 11/03/2014  . Bilateral cataracts 11/03/2014  . COPD, mild ([place]) 11/03/2014  . Acute cystitis 11/03/2014  . 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 Esophagitis, reflux 11/03/2014  . Anxiety, generalized 11/03/2014  . Acid reflux 11/03/2014  . Hypercholesteremia 11/03/2014  . HLD (hyperlipidemia) 11/03/2014  . BP (high blood pressure) 11/03/2014  . Affective disorder, major 11/03/2014  . Menopausal and perimenopausal disorder 11/03/2014  . Obstructive apnea 11/03/2014  . Awareness of heartbeats 11/03/2014  . Hypercholesterolemia without hypertriglyceridemia 11/03/2014  . Allergic rhinitis, seasonal 11/03/2014  . H/O cataract extraction 11/03/2014  . Anxiety 12/16/2013  . Apnea, sleep 12/16/2013    Past Surgical History:  Procedure Laterality Date  . CATARACT EXTRACTION Bilateral   . ETHMOIDECTOMY Bilateral 03/10/2016   Procedure:  ETHMOIDECTOMY;  Surgeon: Beverly Gust, MD;  Location: [facility];  Service: ENT;  Laterality: Bilateral;  . FRONTAL SINUS EXPLORATION Bilateral 03/10/2016   Procedure: FRONTAL WITH TISSUE REMOVAL;  Surgeon: Beverly Gust, MD;  Location: [facility];  Service: ENT;  Laterality: Bilateral;  . HERNIA REPAIR    . IMAGE GUIDED SINUS SURGERY Bilateral 03/10/2016   Procedure: IMAGE GUIDED SINUS SURGERY;  Surgeon: Beverly Gust, MD;  Location: [facility];  Service: ENT;  Laterality: Bilateral;  Need Disk GAVE DISK TO CECE 1-25 KP  . MAXILLARY ANTROSTOMY Bilateral 03/10/2016   Procedure: MAXILLARY ANTROSTOMY;  Surgeon: Beverly Gust, MD;  Location: [facility];  Service: ENT;  Laterality: Bilateral;  . NASAL TURBINATE REDUCTION Bilateral 03/10/2016   Procedure: TURBINATE REDUCTION/SUBMUCOSAL RESECTION;  Surgeon: Beverly Gust, MD;  Location: [facility];  Service: ENT;  Laterality: Bilateral;  . SEPTOPLASTY Bilateral 03/10/2016   Procedure: SEPTOPLASTY;  Surgeon: Beverly Gust, MD;  Location: [facility];  Service: ENT;  Laterality: Bilateral;  . SPHENOIDECTOMY Bilateral 03/10/2016   Procedure: SPHENOIDECTOMY WITH TISSUE REMOVAL;  Surgeon: Beverly Gust, MD;  Location: [facility];  Service: ENT;  Laterality: Bilateral;    Prior to Admission medications   Medication Sig Start Date End Date Taking? Authorizing Provider  acetaminophen (TYLENOL) 500 MG tablet Take 500 mg by mouth every 6 (six) hours as needed for mild pain.     [provider]  albuterol (VENTOLIN HFA) 108 (90 Base) MCG/ACT inhaler Inhale 2 puffs into the lungs every 6 (six) hours as needed for wheezing or shortness of breath. 12/17/18   Parrett, Fonnie Mu,  NP  amLODipine (NORVASC) 5 MG tablet TAKE 1 TABLET (5 MG TOTAL) BY MOUTH DAILY. 10/28/18   Jerrol Banana., MD  busPIRone (BUSPAR) 30 MG tablet TAKE 1 TABLET BY MOUTH EVERY DAY 08/12/18   Jerrol Banana., MD   calcium carbonate (OS-CAL) 600 MG TABS tablet Take 600 mg by mouth. Takes occasionally    [provider]  cetirizine (ZYRTEC ALLERGY) 10 MG tablet Take 1 tablet (10 mg total) by mouth daily. 08/22/17   Flora Lipps, MD  citalopram (CELEXA) 10 MG tablet TAKE 1 TABLET (10 MG TOTAL) BY MOUTH DAILY. 08/03/18   Jerrol Banana., MD  FLOVENT HFA 110 MCG/ACT inhaler Inhale 2 puffs into the lungs 2 (two) times daily. 12/17/18   Parrett, Fonnie Mu, NP  fluticasone (FLONASE) 50 MCG/ACT nasal spray 2 SPRAYS EACH NOSTRIL TWICE DAILY 07/04/16   [provider]  meclizine (ANTIVERT) 25 MG tablet Take 25 mg by mouth 3 (three) times daily as needed for dizziness.    [provider]  omeprazole (PRILOSEC) 40 MG capsule Take 40 mg by mouth daily. 07/19/17   [provider]  rosuvastatin (CRESTOR) 5 MG tablet Take 1 tablet (5 mg total) by mouth daily. 02/07/18   Jerrol Banana., MD    Allergies Ciprofloxacin  Family History  Problem Relation Age of Onset  . Epilepsy Mother   . Osteoporosis Mother   . COPD Father   . Hypertension Father   . CVA Father   . Allergies Father   . Lung cancer Father   . Emphysema Father   . Cancer Sister        breast  . Breast cancer Sister 84  . CVA Brother   . COPD Brother   . Heart disease Brother   . Arthritis Brother   . CVA Brother   . Cancer Brother        cancer  . Healthy Brother   . Cancer Maternal Grandmother   . Hypertension Maternal Grandmother   . Healthy Brother   . Osteoporosis Sister   . COPD Sister     Social History Social History   Tobacco Use  . Smoking status: Never Smoker  . Smokeless tobacco: Never Used  Substance Use Topics  . Alcohol use: No    Alcohol/week: 0.0 standard drinks  . Drug use: No    Review of Systems  Constitutional: No fever/chills Eyes: No visual changes. ENT: No sore throat.  Positive difficulty swallowing due to mucous, complains of sinus drainage Respiratory:  Positive cough Cardiovascular: Denies chest pain Gastrointestinal: Denies abdominal pain, positive one episode of vomiting Genitourinary: Negative for dysuria. Musculoskeletal: Negative for back pain. Skin: Negative for rash. Psychiatric: no mood changes,     ____________________________________________   PHYSICAL EXAM:  VITAL SIGNS: ED Triage Vitals  Enc Vitals Group     BP 05/07/19 1623 (!) 153/66     Pulse Rate 05/07/19 1623 (!) 103     Resp 05/07/19 1623 18     Temp 05/07/19 1623 97.7 F (36.5 C)     Temp Source 05/07/19 1623 Oral     SpO2 05/07/19 1623 100 %     Weight 05/07/19 1623 140 lb (63.5 kg)     Height 05/07/19 1623 5\' 3"  (1.6 m)     Head Circumference --      Peak Flow --      Pain Score 05/07/19 1634 0     Pain Loc --  Pain Edu? --      Excl. in [place]? --     Constitutional: Alert and oriented. Well appearing and in no acute distress. Eyes: Conjunctivae are normal.  Head: Atraumatic. Ears TMs are clear bilaterally Nose: No congestion/rhinnorhea.  Nasal mucosa appears normal Mouth/Throat: Mucous membranes are moist.  Throat appears normal Neck:  supple no lymphadenopathy noted Cardiovascular: Normal rate, regular rhythm. Heart sounds are normal Respiratory: Normal respiratory effort.  No retractions, lungs with wheezing in the left lung only GU: deferred Musculoskeletal: FROM all extremities, warm and well perfused Neurologic:  Normal speech and language.  Skin:  Skin is warm, dry and intact. No rash noted. Psychiatric: Mood and affect are normal. Speech and behavior are normal.  ____________________________________________   LABS (all labs ordered are listed, but only abnormal results are displayed)  Labs Reviewed  SARS CORONAVIRUS 2 (TAT 6-24 HRS)   ____________________________________________   ____________________________________________  RADIOLOGY  Chest x-ray is  normal  ____________________________________________   PROCEDURES  Procedure(s) performed: No  Procedures    ____________________________________________   INITIAL IMPRESSION / ASSESSMENT AND PLAN / ED COURSE  Pertinent labs & imaging results that were available during my care of the patient were reviewed by me and considered in my medical decision making (see chart for details).   Patient is an 86 year old female presents emergency department with complaints of some sinus drainage along with choking on mucus.  Cough and one episode of vomiting.  No fever or chills.  Patient's had her vaccinations for Covid.  Physical exam patient appears well.  Pulse is little elevated at 103.  Pulse ox is 100% on room air.  Lungs with some wheezing in the left lung field.  Remainder exams are unremarkable  DDx: Acute sinusitis, acute bronchitis, pneumonia, Covid  Chest x-ray ordered  ----------------------------------------- 5:44 PM on 05/07/2019 -----------------------------------------  Chest x-ray is normal, blood pressure is improved to 145/65.  Patient's daughter is in the exam room while discussed the chest x-ray results.  She feels that her mother panicked and is uneasy due to the impending stretching of her esophagus.  She was seen by Dr. Tami Ribas yesterday and was told he might do this.  They state they have no other concerns today.  No concerns for any kind of cardiac event.  Patient is to follow-up with Dr. Tami Ribas and was discharged in the care of her daughter in stable condition.    Kathy Pacheco was evaluated in Emergency Department on 05/07/2019 for the symptoms described in the history of present illness. She was evaluated in the context of the global COVID-19 pandemic, which necessitated consideration that the patient might be at risk for infection with the SARS-CoV-2 virus that causes COVID-19. Institutional protocols and algorithms that pertain to the evaluation of patients at  risk for COVID-19 are in a state of rapid change based on information released by regulatory bodies including the CDC and federal and state organizations. These policies and algorithms were followed during the patient's care in the ED.   As part of my medical decision making, I reviewed the following data within the [place] History obtained from family, Nursing notes reviewed and incorporated, Old chart reviewed, Radiograph reviewed , Notes from prior ED visits and [place] Controlled Substance Database  ____________________________________________   FINAL CLINICAL IMPRESSION(S) / ED DIAGNOSES  Final diagnoses:  Nasal congestion  Cough      NEW MEDICATIONS STARTED DURING THIS VISIT:  New Prescriptions   No medications on file  Note:  This document was prepared using Dragon voice recognition software and may include unintentional dictation errors.    Versie Starks, PA-C 05/07/19 1745    Merlyn Lot, MD 05/07/19 2038

## 2019-05-08 LAB — SARS CORONAVIRUS 2 (TAT 6-24 HRS): SARS Coronavirus 2: NEGATIVE

## 2019-05-16 ENCOUNTER — Ambulatory Visit: Payer: Self-pay | Admitting: Physician Assistant

## 2019-05-19 ENCOUNTER — Ambulatory Visit (INDEPENDENT_AMBULATORY_CARE_PROVIDER_SITE_OTHER): Payer: Medicare Other | Admitting: Family Medicine

## 2019-05-19 ENCOUNTER — Other Ambulatory Visit: Payer: Self-pay

## 2019-05-19 ENCOUNTER — Encounter: Payer: Self-pay | Admitting: Family Medicine

## 2019-05-19 VITALS — BP 114/72 | HR 76 | Temp 96.9°F | Wt 138.6 lb

## 2019-05-19 DIAGNOSIS — I10 Essential (primary) hypertension: Secondary | ICD-10-CM

## 2019-05-19 DIAGNOSIS — R634 Abnormal weight loss: Secondary | ICD-10-CM | POA: Diagnosis not present

## 2019-05-19 DIAGNOSIS — F3341 Major depressive disorder, recurrent, in partial remission: Secondary | ICD-10-CM

## 2019-05-19 DIAGNOSIS — J302 Other seasonal allergic rhinitis: Secondary | ICD-10-CM | POA: Diagnosis not present

## 2019-05-19 DIAGNOSIS — F419 Anxiety disorder, unspecified: Secondary | ICD-10-CM

## 2019-05-19 MED ORDER — MONTELUKAST SODIUM 10 MG PO TABS: 10.0000 mg | ORAL_TABLET | Freq: Every day | ORAL | 2 refills | Status: DC

## 2019-05-19 NOTE — Progress Notes (Signed)
Established patient visit      Patient: Kathy Pacheco   DOB: Jun 15, 1931   86 y.o. Female  MRN: [id number] Visit Date: 05/19/2019  Today's healthcare provider: [name], MD  Subjective:    Chief Complaint  Patient presents with  . Sinus Problem   Sinus Problem This is a new problem. The current episode started in the past 7 days. The problem has been waxing and waning since onset. There has been no fever. Associated symptoms include sinus pressure and sneezing. Pertinent negatives include no chills, congestion, coughing, ear pain, headaches, hoarse voice, shortness of breath or sore throat. (Patient feels nauseous due to sinus drainage ) Past treatments include nothing. The treatment provided no relief.   She is convinced that she has a sinus infection.  Significant drainage.  No pain in her sinuses.  No dental pain.  She has a little postnasal drainage that bothers her.      Medications: Outpatient Medications Prior to Visit  Medication Sig  . acetaminophen (TYLENOL) 500 MG tablet Take 500 mg by mouth every 6 (six) hours as needed for mild pain.   Marland Kitchen albuterol (VENTOLIN HFA) 108 (90 Base) MCG/ACT inhaler Inhale 2 puffs into the lungs every 6 (six) hours as needed for wheezing or shortness of breath.  Marland Kitchen amLODipine (NORVASC) 5 MG tablet TAKE 1 TABLET (5 MG TOTAL) BY MOUTH DAILY.  . busPIRone (BUSPAR) 30 MG tablet TAKE 1 TABLET BY MOUTH EVERY DAY  . calcium carbonate (OS-CAL) 600 MG TABS tablet Take 600 mg by mouth. Takes occasionally  . cetirizine (ZYRTEC ALLERGY) 10 MG tablet Take 1 tablet (10 mg total) by mouth daily.  . citalopram (CELEXA) 10 MG tablet TAKE 1 TABLET (10 MG TOTAL) BY MOUTH DAILY.  Marland Kitchen FLOVENT HFA 110 MCG/ACT inhaler Inhale 2 puffs into the lungs 2 (two) times daily.  . fluticasone (FLONASE) 50 MCG/ACT nasal spray 2 SPRAYS EACH NOSTRIL TWICE DAILY  . omeprazole (PRILOSEC) 40 MG capsule Take 40 mg by mouth daily.  . meclizine (ANTIVERT) 25 MG tablet Take  25 mg by mouth 3 (three) times daily as needed for dizziness.  . rosuvastatin (CRESTOR) 5 MG tablet Take 1 tablet (5 mg total) by mouth daily. (Patient not taking: Reported on 05/19/2019)   No facility-administered medications prior to visit.    Review of Systems  Constitutional: Negative for chills.  HENT: Positive for sinus pressure and sneezing. Negative for congestion, ear pain, hoarse voice and sore throat.   Eyes: Negative.   Respiratory: Negative for cough and shortness of breath.   Cardiovascular: Negative.   Gastrointestinal: Negative.   Endocrine: Negative.   Genitourinary: Negative.   Allergic/Immunologic: Negative.   Neurological: Negative for headaches.  Hematological: Negative.   Psychiatric/Behavioral: Negative.          Objective:    BP 114/72 (BP Location: Right Arm, Patient Position: Sitting, Cuff Size: Normal)   Pulse 76   Temp (!) 96.9 F (36.1 C) (Temporal)   Wt 138 lb 9.6 oz (62.9 kg)   BMI 24.55 kg/m  Wt Readings from Last 3 Encounters:  05/19/19 138 lb 9.6 oz (62.9 kg)  05/07/19 140 lb (63.5 kg)  02/17/19 145 lb (65.8 kg)      Physical Exam Vitals reviewed.  Constitutional:      Appearance: She is well-developed.  HENT:     Head: Normocephalic and atraumatic.     Comments: Sinuses nontender    Right Ear: Tympanic membrane and external ear  normal.     Left Ear: Tympanic membrane and external ear normal.     Mouth/Throat:     Mouth: Mucous membranes are moist.     Pharynx: Oropharynx is clear.  Eyes:     General: No scleral icterus.    Conjunctiva/sclera: Conjunctivae normal.     Pupils: Pupils are equal, round, and reactive to light.  Neck:     Thyroid: No thyromegaly.  Cardiovascular:     Rate and Rhythm: Normal rate and regular rhythm.     Heart sounds: Normal heart sounds.  Pulmonary:     Effort: Pulmonary effort is normal.     Breath sounds: Normal breath sounds.  Abdominal:     Palpations: Abdomen is soft.  Lymphadenopathy:       Cervical: No cervical adenopathy.  Skin:    General: Skin is warm and dry.  Neurological:     General: No focal deficit present.     Mental Status: She is alert and oriented to person, place, and time.  Psychiatric:        Mood and Affect: Mood normal.        Behavior: Behavior normal.        Thought Content: Thought content normal.        Judgment: Judgment normal.       No results found for any visits on 05/19/19.    Assessment & Plan:    1. Seasonal allergies I do not think the patient has a sinusitis.  At this time will add montelukast 10 mg daily for allergies.  Also use sinus rinses 3 times a day.  May need referral to ENT. - montelukast (SINGULAIR) 10 MG tablet; Take 1 tablet (10 mg total) by mouth daily.  Dispense: 30 tablet; Refill: 2  2. Essential hypertension Trolled on amlodipine  3. Anxiety Stable on BuSpar  4. Recurrent major depressive disorder, in partial remission ([place]) Doing well on Celexa  5. Weight loss Patient has lost about 7 pounds but she is a very small lady.  We will follow-up in 1 to 2 months to make sure that this has stabilized.  Work-up as indicated.  She has no other alarm symptoms or worrisome issues.      Pennye Beeghly Cranford Mon, MD  [facility] [phone] (phone) [phone] (fax)  [place]

## 2019-05-19 NOTE — Patient Instructions (Signed)
Try Sinus Rinse 3x Daily!!!

## 2019-06-05 ENCOUNTER — Telehealth: Payer: Self-pay | Admitting: Family Medicine

## 2019-06-05 ENCOUNTER — Telehealth: Payer: Self-pay

## 2019-06-05 NOTE — Telephone Encounter (Signed)
On her last visit I thought her nausea was much better.  I am happy to set up a GI referral but I can also see her on Monday about 2:00 and follow through on her symptoms.  So I am happy to see her and happy to set up the GI referral and happy to do both actually

## 2019-06-05 NOTE — Telephone Encounter (Signed)
Spoke to patient's daughter and she said that her mother is getting to the point where she does not want to do the things she normally enjoys doing because she is afraid she is going to get sick. I asked her daughter if she thinks she can get her mother to come in for an appointment on Monday.

## 2019-06-05 NOTE — Progress Notes (Signed)
Established patient visit  I,[name],acting as a scribe for [name], MD.,have documented all relevant documentation on the behalf of [name], MD,as directed by  [name], MD while in the presence of [name], MD.   Patient: Kathy Pacheco   DOB: [birth year]   87 y.o. Female  MRN: [id number] Visit Date: 06/09/2019  Today's healthcare provider: [name], MD   Chief Complaint  Patient presents with  . Follow-up  . Weight Loss  . Nausea   Subjective    HPI  The daughter accompanies the patient today and brings in a lengthy description of her recent issues.  She has no energy and gets she gets nauseated easily.  Frequently occurs after eating.  She does go out to breakfast every morning for to go start and go to church.  She seems to have some anhedonia. She has no abdominal pain with the nausea.  No fevers or vomiting. Seasonal allergies From 05/19/2019-I do not think the patient has a sinusitis.  At this time will add montelukast 10 mg daily for allergies.  Also use sinus rinses 3 times a day.  May need referral to ENT.  Weight loss From 05/19/2019-Patient has lost about 7 pounds but she is a very small lady.  We will follow-up in 1 to 2 months to make sure that this has stabilized.  Work-up as indicated.  She has no other alarm symptoms or worrisome issues.  Patient is also having nausea most mornings and occasionally when she eats. Patient also has some vomiting to few occasions. No dizziness.  Past Medical History:  Diagnosis Date  . Anxiety   . GERD (gastroesophageal reflux disease)   . High cholesterol   . Hypercholesteremia   . Hypertension   . PONV (postoperative nausea and vomiting)        Medications: Outpatient Medications Prior to Visit  Medication Sig  . acetaminophen (TYLENOL) 500 MG tablet Take 500 mg by mouth every 6 (six) hours as needed for mild pain.   Marland Kitchen albuterol (VENTOLIN HFA) 108 (90 Base) MCG/ACT  inhaler Inhale 2 puffs into the lungs every 6 (six) hours as needed for wheezing or shortness of breath.  Marland Kitchen amLODipine (NORVASC) 5 MG tablet TAKE 1 TABLET (5 MG TOTAL) BY MOUTH DAILY.  . citalopram (CELEXA) 10 MG tablet TAKE 1 TABLET (10 MG TOTAL) BY MOUTH DAILY.  Marland Kitchen FLOVENT HFA 110 MCG/ACT inhaler Inhale 2 puffs into the lungs 2 (two) times daily.  . fluticasone (FLONASE) 50 MCG/ACT nasal spray 2 SPRAYS EACH NOSTRIL TWICE DAILY  . omeprazole (PRILOSEC) 40 MG capsule Take 40 mg by mouth daily.  . busPIRone (BUSPAR) 30 MG tablet TAKE 1 TABLET BY MOUTH EVERY DAY (Patient not taking: Reported on 06/09/2019)  . calcium carbonate (OS-CAL) 600 MG TABS tablet Take 600 mg by mouth. Takes occasionally  . cetirizine (ZYRTEC ALLERGY) 10 MG tablet Take 1 tablet (10 mg total) by mouth daily. (Patient not taking: Reported on 06/09/2019)  . meclizine (ANTIVERT) 25 MG tablet Take 25 mg by mouth 3 (three) times daily as needed for dizziness.  . montelukast (SINGULAIR) 10 MG tablet Take 1 tablet (10 mg total) by mouth daily. (Patient not taking: Reported on 06/09/2019)  . rosuvastatin (CRESTOR) 5 MG tablet Take 1 tablet (5 mg total) by mouth daily. (Patient not taking: Reported on 05/19/2019)   No facility-administered medications prior to visit.    Review of Systems  Constitutional: Negative for  appetite change, chills, fatigue and fever.  Eyes: Negative.   Respiratory: Negative for chest tightness and shortness of breath.   Cardiovascular: Negative for chest pain and palpitations.  Gastrointestinal: Positive for nausea. Negative for abdominal pain and vomiting.  Endocrine: Negative.   Genitourinary: Negative.   Musculoskeletal: Negative.   Allergic/Immunologic: Negative.   Neurological: Negative for dizziness and weakness.  Hematological: Negative.   Psychiatric/Behavioral: Negative.        Objective    BP 112/67 (BP Location: Right Arm, Patient Position: Sitting, Cuff Size: Large)   Pulse 72   Temp (!)  97.5 F (36.4 C) (Other (Comment))   Resp 18   Ht 5\' 3"  (1.6 m)   Wt 137 lb (62.1 kg)   SpO2 95%   BMI 24.27 kg/m  Wt Readings from Last 3 Encounters:  06/09/19 137 lb (62.1 kg)  05/19/19 138 lb 9.6 oz (62.9 kg)  05/07/19 140 lb (63.5 kg)      Physical Exam Vitals reviewed.  Constitutional:      Appearance: She is well-developed.  HENT:     Head: Normocephalic and atraumatic.     Right Ear: Tympanic membrane and external ear normal.     Left Ear: Tympanic membrane and external ear normal.     Mouth/Throat:     Mouth: Mucous membranes are moist.     Pharynx: Oropharynx is clear.  Eyes:     General: No scleral icterus.    Conjunctiva/sclera: Conjunctivae normal.     Pupils: Pupils are equal, round, and reactive to light.  Neck:     Thyroid: No thyromegaly.  Cardiovascular:     Rate and Rhythm: Normal rate and regular rhythm.     Heart sounds: Normal heart sounds.  Pulmonary:     Effort: Pulmonary effort is normal.     Breath sounds: Normal breath sounds.  Abdominal:     Palpations: Abdomen is soft. There is no mass.     Tenderness: There is no abdominal tenderness.     Comments: Negative Murphy sign  Lymphadenopathy:     Cervical: No cervical adenopathy.  Skin:    General: Skin is warm and dry.  Neurological:     General: No focal deficit present.     Mental Status: She is alert and oriented to person, place, and time.  Psychiatric:        Mood and Affect: Mood normal.        Behavior: Behavior normal.        Thought Content: Thought content normal.        Judgment: Judgment normal.       No results found for any visits on 06/09/19.  Assessment & Plan     1. Weight loss She has lost just 223 pounds over the past year and a half and it may be multifactorial. - CBC w/Diff/Platelet - Lipase - Comprehensive Metabolic Panel (CMET) - Vitamin B12  2. Nausea Start with lab work and general abdominal ultrasound.  Could be gallbladder disease.  May need CT  scan if all the symptoms persist.  Any GI referral.  Try ondansetron 4mg  every 8 hours as needed - CBC w/Diff/Platelet - Lipase - Comprehensive Metabolic Panel (CMET) - Vitamin B12 - US Abdomen Complete - ondansetron (ZOFRAN-ODT) 4 MG disintegrating tablet; Take 1 tablet (4 mg total) by mouth every 8 (eight) hours as needed for nausea or vomiting.  Dispense: 20 tablet; Refill: 3  3. Gastritis, presence of bleeding unspecified, unspecified chronicity, unspecified gastritis  type Start Prilosec daily - CBC w/Diff/Platelet - Lipase - Comprehensive Metabolic Panel (CMET) - US Abdomen Complete  4. Depression, recurrent ([place]) Takes citalopram daily which she has not been taking - CBC w/Diff/Platelet - Lipase - Comprehensive Metabolic Panel (CMET) - Vitamin B12  5. Fatigue, unspecified type  - Vitamin B12  6. Other general symptoms and signs   - Vitamin B12   Return in about 4 weeks (around 07/07/2019).         Helios Kohlmann Cranford Mon, MD  [facility] [phone] (phone) [phone] (fax)  [place]

## 2019-06-05 NOTE — Telephone Encounter (Signed)
Please advise referral? Patient has had 2 separate office visit's concerning nausea in the past few months.

## 2019-06-05 NOTE — Telephone Encounter (Signed)
Kathy Pacheco pt daughter is calling and he mother is having nausea and they can not find the source of nausea. Pt daughter is requesting dr Rosanna Randy or his nurse to return her call and to see if dr Rosanna Randy would refer her mother  to  GI specialist

## 2019-06-05 NOTE — Telephone Encounter (Signed)
Advised the daughter that since Kathy Pacheco has some confusion she would be able to come with her to the visit.  She will just have to be screened and abide by the mask and social distancing guidelines being followed.   Copied from [place] [id number]. Topic: General - Other >> Jun 05, 2019  2:22 PM Rainey Pines A wrote: Patients daughter Kathy Pacheco is requesting callback from Thornton Papas in  regards to patients appointment Monday and needing to attend to assist patient. Please advise

## 2019-06-09 ENCOUNTER — Ambulatory Visit (INDEPENDENT_AMBULATORY_CARE_PROVIDER_SITE_OTHER): Payer: Medicare Other | Admitting: Family Medicine

## 2019-06-09 ENCOUNTER — Other Ambulatory Visit: Payer: Self-pay

## 2019-06-09 ENCOUNTER — Encounter: Payer: Self-pay | Admitting: Family Medicine

## 2019-06-09 VITALS — BP 112/67 | HR 72 | Temp 97.5°F | Resp 18 | Ht 63.0 in | Wt 137.0 lb

## 2019-06-09 DIAGNOSIS — K2970 Gastritis, unspecified, without bleeding: Secondary | ICD-10-CM | POA: Diagnosis not present

## 2019-06-09 DIAGNOSIS — R6889 Other general symptoms and signs: Secondary | ICD-10-CM

## 2019-06-09 DIAGNOSIS — F339 Major depressive disorder, recurrent, unspecified: Secondary | ICD-10-CM

## 2019-06-09 DIAGNOSIS — R110 Nausea: Secondary | ICD-10-CM | POA: Diagnosis not present

## 2019-06-09 DIAGNOSIS — R634 Abnormal weight loss: Secondary | ICD-10-CM | POA: Diagnosis not present

## 2019-06-09 DIAGNOSIS — R5383 Other fatigue: Secondary | ICD-10-CM

## 2019-06-09 MED ORDER — ONDANSETRON 4 MG PO TBDP: 4.0000 mg | ORAL_TABLET | Freq: Three times a day (TID) | ORAL | 3 refills | Status: DC | PRN

## 2019-06-09 NOTE — Patient Instructions (Signed)
Take Prilosec, Citalopram and over-the-counter vitamin B12.

## 2019-06-12 DIAGNOSIS — F339 Major depressive disorder, recurrent, unspecified: Secondary | ICD-10-CM | POA: Diagnosis not present

## 2019-06-12 DIAGNOSIS — K2970 Gastritis, unspecified, without bleeding: Secondary | ICD-10-CM | POA: Diagnosis not present

## 2019-06-12 DIAGNOSIS — R634 Abnormal weight loss: Secondary | ICD-10-CM | POA: Diagnosis not present

## 2019-06-12 DIAGNOSIS — R5383 Other fatigue: Secondary | ICD-10-CM | POA: Diagnosis not present

## 2019-06-12 DIAGNOSIS — R6889 Other general symptoms and signs: Secondary | ICD-10-CM | POA: Diagnosis not present

## 2019-06-12 DIAGNOSIS — R110 Nausea: Secondary | ICD-10-CM | POA: Diagnosis not present

## 2019-06-13 ENCOUNTER — Telehealth: Payer: Self-pay

## 2019-06-13 LAB — COMPREHENSIVE METABOLIC PANEL
ALT: 7 IU/L (ref 0–32)
AST: 10 IU/L (ref 0–40)
Albumin/Globulin Ratio: 1.5 (ref 1.2–2.2)
Albumin: 4 g/dL (ref 3.6–4.6)
Alkaline Phosphatase: 100 IU/L (ref 39–117)
BUN/Creatinine Ratio: 14 (ref 12–28)
BUN: 14 mg/dL (ref 8–27)
Bilirubin Total: 0.3 mg/dL (ref 0.0–1.2)
CO2: 24 mmol/L (ref 20–29)
Calcium: 9.6 mg/dL (ref 8.7–10.3)
Chloride: 102 mmol/L (ref 96–106)
Creatinine, Ser: 1.03 mg/dL — ABNORMAL HIGH (ref 0.57–1.00)
GFR calc Af Amer: 57 mL/min/{1.73_m2} — ABNORMAL LOW (ref >59)
GFR calc non Af Amer: 50 mL/min/{1.73_m2} — ABNORMAL LOW (ref >59)
Globulin, Total: 2.7 g/dL (ref 1.5–4.5)
Glucose: 91 mg/dL (ref 65–99)
Potassium: 4.4 mmol/L (ref 3.5–5.2)
Sodium: 141 mmol/L (ref 134–144)
Total Protein: 6.7 g/dL (ref 6.0–8.5)

## 2019-06-13 LAB — CBC WITH DIFFERENTIAL/PLATELET
Basophils Absolute: 0.1 10*3/uL (ref 0.0–0.2)
Basos: 2 %
EOS (ABSOLUTE): 0.5 10*3/uL — ABNORMAL HIGH (ref 0.0–0.4)
Eos: 8 %
Hematocrit: 35.5 % (ref 34.0–46.6)
Hemoglobin: 11.4 g/dL (ref 11.1–15.9)
Immature Grans (Abs): 0 10*3/uL (ref 0.0–0.1)
Immature Granulocytes: 0 %
Lymphocytes Absolute: 1.6 10*3/uL (ref 0.7–3.1)
Lymphs: 24 %
MCH: 26.8 pg (ref 26.6–33.0)
MCHC: 32.1 g/dL (ref 31.5–35.7)
MCV: 83 fL (ref 79–97)
Monocytes Absolute: 0.7 10*3/uL (ref 0.1–0.9)
Monocytes: 10 %
Neutrophils Absolute: 3.9 10*3/uL (ref 1.4–7.0)
Neutrophils: 56 %
Platelets: 362 10*3/uL (ref 150–450)
RBC: 4.26 x10E6/uL (ref 3.77–5.28)
RDW: 15 % (ref 11.7–15.4)
WBC: 6.9 10*3/uL (ref 3.4–10.8)

## 2019-06-13 LAB — VITAMIN B12: Vitamin B-12: 194 pg/mL — ABNORMAL LOW (ref 232–1245)

## 2019-06-13 LAB — LIPASE: Lipase: 20 U/L (ref 14–85)

## 2019-06-13 NOTE — Telephone Encounter (Signed)
lmtcb okay for [facility] triage to advise patient of results. KW

## 2019-06-13 NOTE — Telephone Encounter (Signed)
-----   Message from Kathy Pacheco., MD sent at 06/13/2019 10:07 AM EDT ----- Labs stable but patient is B12 deficient.  Start B12 injections weekly for 4 weeks and then once a month.  He should have a follow-up appointment with me in a month or 2

## 2019-06-13 NOTE — Telephone Encounter (Signed)
FYI. KW 

## 2019-06-13 NOTE — Telephone Encounter (Signed)
Pt. returned call to discuss lab results.  Advised of result note per Dr. Rosanna Randy today.  Scheduled pt. for 2 of 4 weekly B12 Injections, (5/11, and 5/18), and noted that she has a 2 mo. F/u with Dr. Rosanna Randy on 6/22.  Pt. stated she will make her 3rd and 4th weekly B12 injection appointments, when she is in the office.  Pt. Verb. Understanding of plan.

## 2019-06-16 ENCOUNTER — Ambulatory Visit
Admission: RE | Admit: 2019-06-16 | Discharge: 2019-06-16 | Disposition: A | Discharge status: Home / Self Care | Payer: Medicare Other | Source: Ambulatory Visit | Attending: Family Medicine | Admitting: Family Medicine

## 2019-06-16 ENCOUNTER — Other Ambulatory Visit: Payer: Self-pay

## 2019-06-16 DIAGNOSIS — N281 Cyst of kidney, acquired: Secondary | ICD-10-CM | POA: Diagnosis not present

## 2019-06-16 DIAGNOSIS — K7689 Other specified diseases of liver: Secondary | ICD-10-CM | POA: Diagnosis not present

## 2019-06-16 DIAGNOSIS — K2970 Gastritis, unspecified, without bleeding: Secondary | ICD-10-CM | POA: Diagnosis not present

## 2019-06-16 DIAGNOSIS — R110 Nausea: Secondary | ICD-10-CM | POA: Diagnosis not present

## 2019-06-17 ENCOUNTER — Ambulatory Visit (INDEPENDENT_AMBULATORY_CARE_PROVIDER_SITE_OTHER): Payer: Medicare Other

## 2019-06-17 ENCOUNTER — Other Ambulatory Visit: Payer: Self-pay

## 2019-06-17 DIAGNOSIS — E538 Deficiency of other specified B group vitamins: Secondary | ICD-10-CM

## 2019-06-17 MED ORDER — CYANOCOBALAMIN 1000 MCG/ML IJ SOLN
1000.0000 ug | Freq: Once | INTRAMUSCULAR | Status: AC
  Administered 2019-06-17: 1000 ug via INTRAMUSCULAR

## 2019-06-20 NOTE — Progress Notes (Signed)
Established patient visit  I,[name],acting as a scribe for [name], MD.,have documented all relevant documentation on the behalf of [name], MD,as directed by  [name], MD while in the presence of [name], MD.   Patient: Kathy Pacheco   DOB: 28-Feb-1931   86 y.o. Female  MRN: [id number] Visit Date: 06/25/2019  Today's healthcare provider: [name], MD   Chief Complaint  Patient presents with  . Follow-up   Subjective    HPI Patient had AWV with [facility] on 02/17/2019. Overall patient feels better since starting B12 shots. Weight loss From 06/09/2019-She has lost just 223 pounds over the past year and a half and it may be multifactorial.  Nausea From 06/09/2019-Started with lab work and general abdominal ultrasound.  Could be gallbladder disease.  May need CT scan if all the symptoms persist.  Any GI referral.  Try ondansetron 4mg  every 8 hours as needed.  Gastritis, presence of bleeding unspecified, unspecified chronicity, unspecified gastritis type From 06/09/2019-Started Prilosec daily.  Depression, recurrent ([place]) From 06/09/2019-Takes citalopram daily which she has not been taking.      Medications: Outpatient Medications Prior to Visit  Medication Sig  . acetaminophen (TYLENOL) 500 MG tablet Take 500 mg by mouth every 6 (six) hours as needed for mild pain.   Marland Kitchen albuterol (VENTOLIN HFA) 108 (90 Base) MCG/ACT inhaler Inhale 2 puffs into the lungs every 6 (six) hours as needed for wheezing or shortness of breath.  Marland Kitchen amLODipine (NORVASC) 5 MG tablet TAKE 1 TABLET (5 MG TOTAL) BY MOUTH DAILY.  . calcium carbonate (OS-CAL) 600 MG TABS tablet Take 600 mg by mouth. Takes occasionally  . citalopram (CELEXA) 10 MG tablet TAKE 1 TABLET (10 MG TOTAL) BY MOUTH DAILY.  Marland Kitchen FLOVENT HFA 110 MCG/ACT inhaler Inhale 2 puffs into the lungs 2 (two) times daily.  . fluticasone (FLONASE) 50 MCG/ACT nasal spray 2 SPRAYS EACH NOSTRIL TWICE  DAILY  . meclizine (ANTIVERT) 25 MG tablet Take 25 mg by mouth 3 (three) times daily as needed for dizziness.  Marland Kitchen omeprazole (PRILOSEC) 40 MG capsule Take 40 mg by mouth daily.  . ondansetron (ZOFRAN-ODT) 4 MG disintegrating tablet Take 1 tablet (4 mg total) by mouth every 8 (eight) hours as needed for nausea or vomiting.  . busPIRone (BUSPAR) 30 MG tablet TAKE 1 TABLET BY MOUTH EVERY DAY (Patient not taking: Reported on 06/09/2019)  . cetirizine (ZYRTEC ALLERGY) 10 MG tablet Take 1 tablet (10 mg total) by mouth daily. (Patient not taking: Reported on 06/09/2019)  . montelukast (SINGULAIR) 10 MG tablet Take 1 tablet (10 mg total) by mouth daily. (Patient not taking: Reported on 06/09/2019)  . rosuvastatin (CRESTOR) 5 MG tablet Take 1 tablet (5 mg total) by mouth daily. (Patient not taking: Reported on 05/19/2019)   No facility-administered medications prior to visit.    Review of Systems  All other systems reviewed and are negative.   Last metabolic panel Lab Results  Component Value Date   GLUCOSE 91 06/12/2019   NA 141 06/12/2019   K 4.4 06/12/2019   CL 102 06/12/2019   CO2 24 06/12/2019   BUN 14 06/12/2019   CREATININE 1.03 (H) 06/12/2019   GFRNONAA 50 (L) 06/12/2019   GFRAA 57 (L) 06/12/2019   CALCIUM 9.6 06/12/2019   PROT 6.7 06/12/2019   ALBUMIN 4.0 06/12/2019   LABGLOB 2.7 06/12/2019   AGRATIO 1.5 06/12/2019   BILITOT 0.3 06/12/2019   ALKPHOS  100 06/12/2019   AST 10 06/12/2019   ALT 7 06/12/2019   ANIONGAP 7 01/08/2017      Objective    BP 112/67 (BP Location: Left Arm, Patient Position: Sitting, Cuff Size: Normal)   Pulse 85   Temp (!) 96.9 F (36.1 C) (Other (Comment))   Resp 16   Ht 5\' 3"  (1.6 m)   Wt 139 lb (63 kg)   SpO2 97%   BMI 24.62 kg/m  Wt Readings from Last 3 Encounters:  06/25/19 139 lb (63 kg)  06/09/19 137 lb (62.1 kg)  05/19/19 138 lb 9.6 oz (62.9 kg)     MMSE - Mini Mental State Exam 06/25/2019 06/09/2019  Orientation to time 5 4  Orientation  to Place 5 5  Registration 3 3  Attention/ Calculation 5 5  Recall 3 3  Language- name 2 objects 2 2  Language- repeat 1 1  Language- follow 3 step command 3 3  Language- read & follow direction 1 1  Write a sentence 1 1  Copy design 1 1  Total score 30 29     Physical Exam Vitals and nursing note reviewed.  Constitutional:      Appearance: Normal appearance.  HENT:     Right Ear: Tympanic membrane normal.     Left Ear: Tympanic membrane normal.     Nose: Nose normal.     Mouth/Throat:     Mouth: Mucous membranes are moist.     Pharynx: Oropharynx is clear.  Eyes:     Conjunctiva/sclera: Conjunctivae normal.  Cardiovascular:     Rate and Rhythm: Normal rate and regular rhythm.     Pulses: Normal pulses.     Heart sounds: Murmur present.     Comments: 2 Over 6 systolic murmur noted Pulmonary:     Effort: Pulmonary effort is normal.     Breath sounds: Normal breath sounds.  Abdominal:     General: Bowel sounds are normal.     Palpations: Abdomen is soft.  Musculoskeletal:        General: Normal range of motion.     Cervical back: Normal range of motion and neck supple.  Skin:    General: Skin is warm and dry.  Neurological:     Mental Status: She is alert.  Psychiatric:        Mood and Affect: Mood normal.        Behavior: Behavior normal.        Thought Content: Thought content normal.        Judgment: Judgment normal.   Declines DRE pelvic or breast exam.   No results found for any visits on 06/25/19.  Assessment & Plan     1. Essential hypertension Controlled.  2. Seasonal allergic rhinitis, unspecified trigger Stable.  3. Gastroesophageal reflux disease with esophagitis without hemorrhage Feeling much better.  4. Other hyperlipidemia   5. Recurrent major depressive disorder, in partial remission ([place]) MMSE now normal.  Follow these.  Thinks she has some MCI.  6. Weight loss He has now lost 20 pounds over the past couple of years.  This  appears to have stabilized.  F/u  1 to 2 months.   No follow-ups on file.      I, [name], MD, have reviewed all documentation for this visit. The documentation on 06/28/19 for the exam, diagnosis, procedures, and orders are all accurate and complete.    Janmichael Giraud Cranford Mon, MD  [facility] [phone] (phone) [phone] (  fax)  [place]

## 2019-06-24 ENCOUNTER — Other Ambulatory Visit: Payer: Self-pay

## 2019-06-24 ENCOUNTER — Ambulatory Visit (INDEPENDENT_AMBULATORY_CARE_PROVIDER_SITE_OTHER): Payer: Medicare Other

## 2019-06-24 DIAGNOSIS — E538 Deficiency of other specified B group vitamins: Secondary | ICD-10-CM

## 2019-06-24 MED ORDER — CYANOCOBALAMIN 1000 MCG/ML IJ SOLN
1000.0000 ug | Freq: Once | INTRAMUSCULAR | Status: AC
  Administered 2019-06-24: 1000 ug via INTRAMUSCULAR

## 2019-06-25 ENCOUNTER — Encounter: Payer: Self-pay | Admitting: Family Medicine

## 2019-06-25 ENCOUNTER — Ambulatory Visit (INDEPENDENT_AMBULATORY_CARE_PROVIDER_SITE_OTHER): Payer: Medicare Other | Admitting: Family Medicine

## 2019-06-25 VITALS — BP 112/67 | HR 85 | Temp 96.9°F | Resp 16 | Ht 63.0 in | Wt 139.0 lb

## 2019-06-25 DIAGNOSIS — E7849 Other hyperlipidemia: Secondary | ICD-10-CM | POA: Diagnosis not present

## 2019-06-25 DIAGNOSIS — F3341 Major depressive disorder, recurrent, in partial remission: Secondary | ICD-10-CM | POA: Diagnosis not present

## 2019-06-25 DIAGNOSIS — K2100 Gastro-esophageal reflux disease with esophagitis, without bleeding: Secondary | ICD-10-CM

## 2019-06-25 DIAGNOSIS — R634 Abnormal weight loss: Secondary | ICD-10-CM | POA: Diagnosis not present

## 2019-06-25 DIAGNOSIS — J302 Other seasonal allergic rhinitis: Secondary | ICD-10-CM | POA: Diagnosis not present

## 2019-06-25 DIAGNOSIS — I10 Essential (primary) hypertension: Secondary | ICD-10-CM | POA: Diagnosis not present

## 2019-07-01 ENCOUNTER — Other Ambulatory Visit: Payer: Self-pay

## 2019-07-01 ENCOUNTER — Ambulatory Visit (INDEPENDENT_AMBULATORY_CARE_PROVIDER_SITE_OTHER): Payer: Medicare Other

## 2019-07-01 VITALS — Temp 97.0°F

## 2019-07-01 DIAGNOSIS — E538 Deficiency of other specified B group vitamins: Secondary | ICD-10-CM

## 2019-07-01 MED ORDER — CYANOCOBALAMIN 1000 MCG/ML IJ SOLN
1000.0000 ug | Freq: Once | INTRAMUSCULAR | Status: AC
  Administered 2019-07-01: 1000 ug via INTRAMUSCULAR

## 2019-07-03 ENCOUNTER — Ambulatory Visit: Payer: Self-pay | Admitting: Family Medicine

## 2019-07-08 ENCOUNTER — Ambulatory Visit: Payer: Self-pay | Admitting: Family Medicine

## 2019-07-08 ENCOUNTER — Encounter: Payer: Self-pay | Admitting: Gastroenterology

## 2019-07-08 ENCOUNTER — Ambulatory Visit (INDEPENDENT_AMBULATORY_CARE_PROVIDER_SITE_OTHER): Payer: Medicare Other | Admitting: Gastroenterology

## 2019-07-08 ENCOUNTER — Other Ambulatory Visit: Payer: Self-pay

## 2019-07-08 VITALS — BP 123/75 | HR 87 | Temp 97.8°F | Ht 63.0 in | Wt 137.2 lb

## 2019-07-08 DIAGNOSIS — R1319 Other dysphagia: Secondary | ICD-10-CM

## 2019-07-08 DIAGNOSIS — R1310 Dysphagia, unspecified: Secondary | ICD-10-CM

## 2019-07-08 NOTE — Progress Notes (Signed)
Kathy Darby, MD [address]  [place]  [place], [place] [address]  Main: [phone]  Fax: [phone]    Gastroenterology Consultation  Referring Provider:     Beverly Gust, MD Primary Care Physician:  Jerrol Banana., MD Primary Gastroenterologist:  Dr. Cephas Pacheco Reason for Consultation:     Dysphagia        HPI:   Kathy Pacheco is a 86 y.o. female referred by Dr. Rosanna Randy, Retia Passe., MD  for consultation & management of dysphagia Kathy Pacheco is a pleasant 86 year old female with no significant past medical history has been experiencing sensation of food stuck and has to chew well with every meal.  She used to have choking episodes, since then she has been avoiding hard meats and has been chewing thoroughly.  This has been ongoing for 6 to 8 months at least.  She did have past history of intermittent heartburn for which she was taking omeprazole.  Recently switched to Protonix 40 mg daily by her PCP.  She denies any weight loss.  She does not have dentures or poor dentition  Patient is accompanied by her daughter today She does not smoke or drink alcohol She is very independent, physically active  NSAIDs: None  Antiplts/Anticoagulants/Anti thrombotics: None  GI Procedures: Colonoscopy in 2005  Past Medical History:  Diagnosis Date  . Anxiety   . GERD (gastroesophageal reflux disease)   . High cholesterol   . Hypercholesteremia   . Hypertension   . PONV (postoperative nausea and vomiting)     Past Surgical History:  Procedure Laterality Date  . CATARACT EXTRACTION Bilateral   . ETHMOIDECTOMY Bilateral 03/10/2016   Procedure: ETHMOIDECTOMY;  Surgeon: Beverly Gust, MD;  Location: [facility];  Service: ENT;  Laterality: Bilateral;  . FRONTAL SINUS EXPLORATION Bilateral 03/10/2016   Procedure: FRONTAL WITH TISSUE REMOVAL;  Surgeon: Beverly Gust, MD;  Location: [facility];  Service: ENT;  Laterality: Bilateral;  .  HERNIA REPAIR    . IMAGE GUIDED SINUS SURGERY Bilateral 03/10/2016   Procedure: IMAGE GUIDED SINUS SURGERY;  Surgeon: Beverly Gust, MD;  Location: [facility];  Service: ENT;  Laterality: Bilateral;  Need Disk GAVE DISK TO CECE 1-25 KP  . MAXILLARY ANTROSTOMY Bilateral 03/10/2016   Procedure: MAXILLARY ANTROSTOMY;  Surgeon: Beverly Gust, MD;  Location: [facility];  Service: ENT;  Laterality: Bilateral;  . NASAL TURBINATE REDUCTION Bilateral 03/10/2016   Procedure: TURBINATE REDUCTION/SUBMUCOSAL RESECTION;  Surgeon: Beverly Gust, MD;  Location: [facility];  Service: ENT;  Laterality: Bilateral;  . SEPTOPLASTY Bilateral 03/10/2016   Procedure: SEPTOPLASTY;  Surgeon: Beverly Gust, MD;  Location: [facility];  Service: ENT;  Laterality: Bilateral;  . SPHENOIDECTOMY Bilateral 03/10/2016   Procedure: SPHENOIDECTOMY WITH TISSUE REMOVAL;  Surgeon: Beverly Gust, MD;  Location: [facility];  Service: ENT;  Laterality: Bilateral;    Current Outpatient Medications:  .  acetaminophen (TYLENOL) 500 MG tablet, Take 500 mg by mouth every 6 (six) hours as needed for mild pain. , Disp: , Rfl:  .  albuterol (VENTOLIN HFA) 108 (90 Base) MCG/ACT inhaler, Inhale 2 puffs into the lungs every 6 (six) hours as needed for wheezing or shortness of breath., Disp: 18 g, Rfl: 6 .  amLODipine (NORVASC) 5 MG tablet, TAKE 1 TABLET (5 MG TOTAL) BY MOUTH DAILY., Disp: 90 tablet, Rfl: 3 .  calcium carbonate (OS-CAL) 600 MG TABS tablet, Take 600 mg by mouth. Takes occasionally, Disp: ,  Rfl:  .  cetirizine (ZYRTEC ALLERGY) 10 MG tablet, Take 1 tablet (10 mg total) by mouth daily., Disp: 30 tablet, Rfl: 6 .  citalopram (CELEXA) 10 MG tablet, TAKE 1 TABLET (10 MG TOTAL) BY MOUTH DAILY., Disp: 90 tablet, Rfl: 3 .  FLOVENT HFA 110 MCG/ACT inhaler, Inhale 2 puffs into the lungs 2 (two) times daily., Disp: 1 Inhaler, Rfl: 6 .  fluticasone (FLONASE) 50 MCG/ACT nasal spray, 2 SPRAYS EACH  NOSTRIL TWICE DAILY, Disp: , Rfl: 9 .  omeprazole (PRILOSEC) 40 MG capsule, Take 40 mg by mouth daily., Disp: , Rfl: 16 .  ondansetron (ZOFRAN-ODT) 4 MG disintegrating tablet, Take 1 tablet (4 mg total) by mouth every 8 (eight) hours as needed for nausea or vomiting., Disp: 20 tablet, Rfl: 3    Family History  Problem Relation Age of Onset  . Epilepsy Mother   . Osteoporosis Mother   . COPD Father   . Hypertension Father   . CVA Father   . Allergies Father   . Lung cancer Father   . Emphysema Father   . Cancer Sister        breast  . Breast cancer Sister 43  . CVA Brother   . COPD Brother   . Heart disease Brother   . Arthritis Brother   . CVA Brother   . Cancer Brother        cancer  . Healthy Brother   . Cancer Maternal Grandmother   . Hypertension Maternal Grandmother   . Healthy Brother   . Osteoporosis Sister   . COPD Sister      Social History   Tobacco Use  . Smoking status: Never Smoker  . Smokeless tobacco: Never Used  Substance Use Topics  . Alcohol use: No    Alcohol/week: 0.0 standard drinks  . Drug use: No    Allergies as of 07/08/2019 - Review Complete 07/08/2019  Allergen Reaction Noted  . Ciprofloxacin Itching 11/03/2014    Review of Systems:    All systems reviewed and negative except where noted in HPI.   Physical Exam:  BP 123/75 (BP Location: Left Arm, Patient Position: Sitting, Cuff Size: Normal)   Pulse 87   Temp 97.8 F (36.6 C) (Oral)   Ht 5\' 3"  (1.6 m)   Wt 137 lb 4 oz (62.3 kg)   BMI 24.31 kg/m  No LMP recorded. Patient is postmenopausal.  General:   Alert,  Well-developed, well-nourished, pleasant and cooperative in NAD Head:  Normocephalic and atraumatic. Eyes:  Sclera clear, no icterus.   Conjunctiva pink. Ears:  Normal auditory acuity. Nose:  No deformity, discharge, or lesions. Mouth:  No deformity or lesions,oropharynx pink & moist. Neck:  Supple; no masses or thyromegaly. Lungs:  Respirations even and unlabored.   Clear throughout to auscultation.   No wheezes, crackles, or rhonchi. No acute distress. Heart:  Regular rate and rhythm; no murmurs, clicks, rubs, or gallops. Abdomen:  Normal bowel sounds. Soft, non-tender and non-distended without masses, hepatosplenomegaly or hernias noted.  No guarding or rebound tenderness.   Rectal: Not performed Msk:  Symmetrical without gross deformities. Good, equal movement & strength bilaterally. Pulses:  Normal pulses noted. Extremities:  No clubbing or edema.  No cyanosis. Neurologic:  Alert and oriented x3;  grossly normal neurologically. Skin:  Intact without significant lesions or rashes. No jaundice. Psych:  Alert and cooperative. Normal mood and affect.  Imaging Studies: None  Assessment and Plan:   Kathy Pacheco is a 86  y.o. female with history of hypertension, GERD is seen in consultation for chronic dysphagia to solids  Dysphagia Recommend upper endoscopy for further evaluation Increase Protonix to 40 mg 2 times a day before meals  Colon cancer screening Patient last colonoscopy was at age 73 in year 2005.  Given that she is otherwise healthy and functionally independent, I discussed with her about risks and benefits of colonoscopy.  Patient would like to proceed with upper endoscopy first   Follow up in 4 to 6 weeks   Kathy Darby, MD

## 2019-07-09 ENCOUNTER — Other Ambulatory Visit: Payer: Self-pay

## 2019-07-09 ENCOUNTER — Emergency Department
Admission: EM | Admit: 2019-07-09 | Discharge: 2019-07-09 | Disposition: A | Discharge status: Home / Self Care | Payer: Medicare Other | Attending: Student | Admitting: Student

## 2019-07-09 ENCOUNTER — Emergency Department: Payer: Medicare Other

## 2019-07-09 DIAGNOSIS — K5792 Diverticulitis of intestine, part unspecified, without perforation or abscess without bleeding: Secondary | ICD-10-CM | POA: Insufficient documentation

## 2019-07-09 DIAGNOSIS — Z79899 Other long term (current) drug therapy: Secondary | ICD-10-CM | POA: Insufficient documentation

## 2019-07-09 DIAGNOSIS — J449 Chronic obstructive pulmonary disease, unspecified: Secondary | ICD-10-CM | POA: Diagnosis not present

## 2019-07-09 DIAGNOSIS — R112 Nausea with vomiting, unspecified: Secondary | ICD-10-CM | POA: Diagnosis not present

## 2019-07-09 DIAGNOSIS — W1839XA Other fall on same level, initial encounter: Secondary | ICD-10-CM | POA: Insufficient documentation

## 2019-07-09 DIAGNOSIS — R1111 Vomiting without nausea: Secondary | ICD-10-CM | POA: Diagnosis not present

## 2019-07-09 DIAGNOSIS — E860 Dehydration: Secondary | ICD-10-CM | POA: Diagnosis not present

## 2019-07-09 DIAGNOSIS — Y9389 Activity, other specified: Secondary | ICD-10-CM | POA: Insufficient documentation

## 2019-07-09 DIAGNOSIS — Y92002 Bathroom of unspecified non-institutional (private) residence single-family (private) house as the place of occurrence of the external cause: Secondary | ICD-10-CM | POA: Insufficient documentation

## 2019-07-09 DIAGNOSIS — S022XXA Fracture of nasal bones, initial encounter for closed fracture: Secondary | ICD-10-CM | POA: Insufficient documentation

## 2019-07-09 DIAGNOSIS — R197 Diarrhea, unspecified: Secondary | ICD-10-CM

## 2019-07-09 DIAGNOSIS — I959 Hypotension, unspecified: Secondary | ICD-10-CM | POA: Diagnosis not present

## 2019-07-09 DIAGNOSIS — S199XXA Unspecified injury of neck, initial encounter: Secondary | ICD-10-CM | POA: Diagnosis not present

## 2019-07-09 DIAGNOSIS — R1110 Vomiting, unspecified: Secondary | ICD-10-CM | POA: Diagnosis present

## 2019-07-09 DIAGNOSIS — Y999 Unspecified external cause status: Secondary | ICD-10-CM | POA: Insufficient documentation

## 2019-07-09 DIAGNOSIS — R110 Nausea: Secondary | ICD-10-CM | POA: Diagnosis not present

## 2019-07-09 DIAGNOSIS — S0990XA Unspecified injury of head, initial encounter: Secondary | ICD-10-CM | POA: Diagnosis not present

## 2019-07-09 DIAGNOSIS — R58 Hemorrhage, not elsewhere classified: Secondary | ICD-10-CM | POA: Diagnosis not present

## 2019-07-09 DIAGNOSIS — I10 Essential (primary) hypertension: Secondary | ICD-10-CM | POA: Insufficient documentation

## 2019-07-09 LAB — URINALYSIS, COMPLETE (UACMP) WITH MICROSCOPIC
Bacteria, UA: NONE SEEN
Bilirubin Urine: NEGATIVE
Glucose, UA: NEGATIVE mg/dL
Hgb urine dipstick: NEGATIVE
Ketones, ur: 20 mg/dL — AB
Leukocytes,Ua: NEGATIVE
Nitrite: NEGATIVE
Protein, ur: NEGATIVE mg/dL
Specific Gravity, Urine: 1.041 — ABNORMAL HIGH (ref 1.005–1.030)
Squamous Epithelial / HPF: NONE SEEN (ref 0–5)
pH: 6 (ref 5.0–8.0)

## 2019-07-09 LAB — COMPREHENSIVE METABOLIC PANEL
ALT: 12 U/L (ref 0–44)
AST: 18 U/L (ref 15–41)
Albumin: 3.6 g/dL (ref 3.5–5.0)
Alkaline Phosphatase: 80 U/L (ref 38–126)
Anion gap: 10 (ref 5–15)
BUN: 21 mg/dL (ref 8–23)
CO2: 25 mmol/L (ref 22–32)
Calcium: 9.1 mg/dL (ref 8.9–10.3)
Chloride: 103 mmol/L (ref 98–111)
Creatinine, Ser: 1.22 mg/dL — ABNORMAL HIGH (ref 0.44–1.00)
GFR calc Af Amer: 47 mL/min — ABNORMAL LOW (ref >60)
GFR calc non Af Amer: 40 mL/min — ABNORMAL LOW (ref >60)
Glucose, Bld: 117 mg/dL — ABNORMAL HIGH (ref 70–99)
Potassium: 3.4 mmol/L — ABNORMAL LOW (ref 3.5–5.1)
Sodium: 138 mmol/L (ref 135–145)
Total Bilirubin: 0.5 mg/dL (ref 0.3–1.2)
Total Protein: 7.2 g/dL (ref 6.5–8.1)

## 2019-07-09 LAB — CBC
HCT: 34.9 % — ABNORMAL LOW (ref 36.0–46.0)
Hemoglobin: 11.3 g/dL — ABNORMAL LOW (ref 12.0–15.0)
MCH: 26.3 pg (ref 26.0–34.0)
MCHC: 32.4 g/dL (ref 30.0–36.0)
MCV: 81.4 fL (ref 80.0–100.0)
Platelets: 348 10*3/uL (ref 150–400)
RBC: 4.29 MIL/uL (ref 3.87–5.11)
RDW: 15.9 % — ABNORMAL HIGH (ref 11.5–15.5)
WBC: 9.7 10*3/uL (ref 4.0–10.5)
nRBC: 0 % (ref 0.0–0.2)

## 2019-07-09 LAB — LIPASE, BLOOD: Lipase: 21 U/L (ref 11–51)

## 2019-07-09 LAB — TROPONIN I (HIGH SENSITIVITY): Troponin I (High Sensitivity): 4 ng/L (ref <18)

## 2019-07-09 LAB — MAGNESIUM: Magnesium: 2 mg/dL (ref 1.7–2.4)

## 2019-07-09 MED ORDER — IOHEXOL 300 MG/ML  SOLN
75.0000 mL | Freq: Once | INTRAMUSCULAR | Status: AC | PRN
  Administered 2019-07-09: 75 mL via INTRAVENOUS

## 2019-07-09 MED ORDER — AMOXICILLIN-POT CLAVULANATE 875-125 MG PO TABS
1.0000 | ORAL_TABLET | Freq: Once | ORAL | Status: AC
  Administered 2019-07-09: 1 via ORAL
  Filled 2019-07-09: qty 1

## 2019-07-09 MED ORDER — SODIUM CHLORIDE 0.9 % IV BOLUS
1000.0000 mL | Freq: Once | INTRAVENOUS | Status: AC
  Administered 2019-07-09: 1000 mL via INTRAVENOUS

## 2019-07-09 MED ORDER — AMOXICILLIN-POT CLAVULANATE 875-125 MG PO TABS: 1.0000 | ORAL_TABLET | Freq: Two times a day (BID) | ORAL | 0 refills | Status: AC

## 2019-07-09 MED ORDER — ONDANSETRON HCL 4 MG/2ML IJ SOLN
4.0000 mg | Freq: Once | INTRAMUSCULAR | Status: AC
  Administered 2019-07-09: 4 mg via INTRAVENOUS
  Filled 2019-07-09: qty 2

## 2019-07-09 NOTE — ED Notes (Signed)
Pt to CT

## 2019-07-09 NOTE — ED Notes (Signed)
E-signature not working at this time. Pt verbalized understanding of D/C instructions, prescriptions and follow up care with no further questions at this time. Pt in NAD and wheeled to lobby at time of D/C.  

## 2019-07-09 NOTE — ED Triage Notes (Addendum)
BIB by [facility] from home. Pt reports diarrhea and emesis that started after breakfast. Pt reports nose bleed that started this AM. Son found pt on floor, unsure if patient fell. Pt denies fall. Denies abdominal pain. EMS reports VSS, CBG WNL. No nose bleed at this time. Repeatedly denies falling. Cleaned in [place] room by staff upon arrival. Minimal stool on patient. Given new clothes due to dried emesis on shirt. Denies nausea now.

## 2019-07-09 NOTE — ED Notes (Signed)
Ambulated to and from subwait bathroom without difficulty. Denies any recent falls. Provided non slid socks. Pt reports that she did not fall despite bruising and redness on nose bridge.

## 2019-07-09 NOTE — Discharge Instructions (Addendum)
Thank you for letting us take care of you in the emergency department today. Your work up today looks like you have diverticulitis as well as a broken nose.   Please continue to take any regular, prescribed medications. Use the Zofran at home as needed for nausea.   New medications we have prescribed:  Augmentin - this is for treatment of diverticulitis   Please follow up with: Your primary care doctor to review your ER visit and follow up on your symptoms.  The ENT doctor to follow up on your broken nose.  The GI doctor at your scheduled appointment. Discuss if they'd recommend a colonoscopy given her new diagnosis.    Please return to the ER for any new or worsening symptoms.

## 2019-07-09 NOTE — ED Provider Notes (Signed)
[facility] Emergency Department Provider Note  ____________________________________________   First MD Initiated Contact with Patient 07/09/19 1529     (approximate)  I have reviewed the triage vital signs and the nursing notes.  History  Chief Complaint Diarrhea and Emesis    HPI Kathy Pacheco is a 86 y.o. female with PMHx as below who present w/ EMS for vomiting and diarrhea. Per report, son found the patient on the floor, unsure if the patient fell.   Per patient, she woke up this AM feeling well, at her baseline, no complaints.  She went to have breakfast, and while walking home she started to develop an upset stomach, felt like she needed to have a BM.  When she got home she sat on the toilet, produced a normal BM, then began to feel nauseous and lightheaded, was worried she was going to pass out.  She started to lay down onto the floor, but thinks she laid down too hard, hitting her face/nose against the floor. She does not think she passed out or had true syncope. She did have an episode of emesis.  From her "fall" she has resultant swelling to the nose and associated nosebleed, now hemostatic and dry.  She reports some residual nausea, but otherwise denies any pain, including any abdominal pain.  Only pain she cites is to her nose, mild in severity, aching, no radiation, no alleviating/aggravating components.  Patient states she has a history of passing out when she eats something bad and has the associated GI symptoms.  She denies any history of arrhythmias.  Denies any shortness of breath or chest pain. Not on any blood thinners.    Past Medical Hx Past Medical History:  Diagnosis Date  . Anxiety   . GERD (gastroesophageal reflux disease)   . High cholesterol   . Hypercholesteremia   . Hypertension   . PONV (postoperative nausea and vomiting)     Problem List Patient Active Problem List   Diagnosis Date Noted  . Chronic cough 12/17/2018  .  Syncope 01/07/2017  . UTI (urinary tract infection) 01/07/2017  . Absolute anemia 11/03/2014  . Bilateral cataracts 11/03/2014  . COPD, mild ([place]) 11/03/2014  . Acute cystitis 11/03/2014  . 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 Esophagitis, reflux 11/03/2014  . Anxiety, generalized 11/03/2014  . Acid reflux 11/03/2014  . Hypercholesteremia 11/03/2014  . HLD (hyperlipidemia) 11/03/2014  . BP (high blood pressure) 11/03/2014  . Affective disorder, major 11/03/2014  . Menopausal and perimenopausal disorder 11/03/2014  . Obstructive apnea 11/03/2014  . Awareness of heartbeats 11/03/2014  . Hypercholesterolemia without hypertriglyceridemia 11/03/2014  . Allergic rhinitis, seasonal 11/03/2014  . H/O cataract extraction 11/03/2014  . Anxiety 12/16/2013  . Apnea, sleep 12/16/2013    Past Surgical Hx Past Surgical History:  Procedure Laterality Date  . CATARACT EXTRACTION Bilateral   . ETHMOIDECTOMY Bilateral 03/10/2016   Procedure: ETHMOIDECTOMY;  Surgeon: Beverly Gust, MD;  Location: [facility];  Service: ENT;  Laterality: Bilateral;  . FRONTAL SINUS EXPLORATION Bilateral 03/10/2016   Procedure: FRONTAL WITH TISSUE REMOVAL;  Surgeon: Beverly Gust, MD;  Location: [facility];  Service: ENT;  Laterality: Bilateral;  . HERNIA REPAIR    . IMAGE GUIDED SINUS SURGERY Bilateral 03/10/2016   Procedure: IMAGE GUIDED SINUS SURGERY;  Surgeon: Beverly Gust, MD;  Location: [facility];  Service: ENT;  Laterality: Bilateral;  Need Disk GAVE DISK TO CECE 1-25 KP  . MAXILLARY ANTROSTOMY Bilateral 03/10/2016  Procedure: MAXILLARY ANTROSTOMY;  Surgeon: Beverly Gust, MD;  Location: [facility];  Service: ENT;  Laterality: Bilateral;  . NASAL TURBINATE REDUCTION Bilateral 03/10/2016   Procedure: TURBINATE REDUCTION/SUBMUCOSAL RESECTION;  Surgeon: Beverly Gust, MD;  Location: [facility];  Service: ENT;  Laterality: Bilateral;  . SEPTOPLASTY Bilateral  03/10/2016   Procedure: SEPTOPLASTY;  Surgeon: Beverly Gust, MD;  Location: [facility];  Service: ENT;  Laterality: Bilateral;  . SPHENOIDECTOMY Bilateral 03/10/2016   Procedure: SPHENOIDECTOMY WITH TISSUE REMOVAL;  Surgeon: Beverly Gust, MD;  Location: [facility];  Service: ENT;  Laterality: Bilateral;    Medications Prior to Admission medications   Medication Sig Start Date End Date Taking? Authorizing Provider  acetaminophen (TYLENOL) 500 MG tablet Take 500 mg by mouth every 6 (six) hours as needed for mild pain.     [provider]  albuterol (VENTOLIN HFA) 108 (90 Base) MCG/ACT inhaler Inhale 2 puffs into the lungs every 6 (six) hours as needed for wheezing or shortness of breath. 12/17/18   Parrett, Tammy S, NP  amLODipine (NORVASC) 5 MG tablet TAKE 1 TABLET (5 MG TOTAL) BY MOUTH DAILY. 10/28/18   Jerrol Banana., MD  calcium carbonate (OS-CAL) 600 MG TABS tablet Take 600 mg by mouth. Takes occasionally    [provider]  cetirizine (ZYRTEC ALLERGY) 10 MG tablet Take 1 tablet (10 mg total) by mouth daily. 08/22/17   Flora Lipps, MD  citalopram (CELEXA) 10 MG tablet TAKE 1 TABLET (10 MG TOTAL) BY MOUTH DAILY. 08/03/18   Jerrol Banana., MD  FLOVENT HFA 110 MCG/ACT inhaler Inhale 2 puffs into the lungs 2 (two) times daily. 12/17/18   Parrett, Fonnie Mu, NP  fluticasone (FLONASE) 50 MCG/ACT nasal spray 2 SPRAYS EACH NOSTRIL TWICE DAILY 07/04/16   [provider]  omeprazole (PRILOSEC) 40 MG capsule Take 40 mg by mouth daily. 07/19/17   [provider]  ondansetron (ZOFRAN-ODT) 4 MG disintegrating tablet Take 1 tablet (4 mg total) by mouth every 8 (eight) hours as needed for nausea or vomiting. 06/09/19   Jerrol Banana., MD    Allergies Ciprofloxacin  Family Hx Family History  Problem Relation Age of Onset  . Epilepsy Mother   . Osteoporosis Mother   . COPD Father   . Hypertension Father   . CVA Father   .  Allergies Father   . Lung cancer Father   . Emphysema Father   . Cancer Sister        breast  . Breast cancer Sister 57  . CVA Brother   . COPD Brother   . Heart disease Brother   . Arthritis Brother   . CVA Brother   . Cancer Brother        cancer  . Healthy Brother   . Cancer Maternal Grandmother   . Hypertension Maternal Grandmother   . Healthy Brother   . Osteoporosis Sister   . COPD Sister     Social Hx Social History   Tobacco Use  . Smoking status: Never Smoker  . Smokeless tobacco: Never Used  Substance Use Topics  . Alcohol use: No    Alcohol/week: 0.0 standard drinks  . Drug use: No     Review of Systems  Constitutional: Negative for fever. Negative for chills. Eyes: Negative for visual changes. ENT: + nose swelling, epistaxis  Cardiovascular: Negative for chest pain. Respiratory: Negative for shortness of breath. Gastrointestinal: + vomiting, diarrhea Genitourinary: Negative for dysuria. Musculoskeletal:  Negative for leg swelling. Skin: Negative for rash. Neurological: Negative for headaches.   Physical Exam  Vital Signs: ED Triage Vitals  Enc Vitals Group     BP 07/09/19 1215 (!) 156/76     Pulse Rate 07/09/19 1215 77     Resp 07/09/19 1215 18     Temp 07/09/19 1215 97.6 F (36.4 C)     Temp Source 07/09/19 1215 Oral     SpO2 07/09/19 1215 98 %     Weight 07/09/19 1216 140 lb (63.5 kg)     Height 07/09/19 1216 5\' 3"  (1.6 m)     Head Circumference --      Peak Flow --      Pain Score 07/09/19 1215 0     Pain Loc --      Pain Edu? --      Excl. in [place]? --     Constitutional: Alert and oriented. Well appearing. NAD.  Head: Normocephalic. Atraumatic.  Midface is stable. Eyes: Conjunctivae clear. Sclera anicteric. Pupils equal and symmetric. Nose: Swelling about the nose and nasal bridge.  Dried epistaxis in bilateral naris.  No obvious septal hematomas. Mouth/Throat: Wearing mask.  No intraoral or dental trauma.  Jaw is well aligned.   No malocclusion. Neck: No stridor. Trachea midline.  No midline CS tenderness. Cardiovascular: Normal rate, regular rhythm. Extremities well perfused. Respiratory: Normal respiratory effort.  Lungs CTAB. Gastrointestinal: Soft. Non-distended. Non-tender.  Genitourinary: Deferred. Musculoskeletal: No lower extremity edema. No deformities.  FROM bilateral shoulders, elbows, wrists, hips, knees, ankles. Neurologic:  Normal speech and language. No gross focal or lateralizing neurologic deficits are appreciated.  Skin: Early developing ecchymosis about the nose.  No rash noted. Psychiatric: Mood and affect are appropriate for situation.  EKG  Personally reviewed and interpreted by myself.   Date: 07/09/19 Time: 1220 Rate: 82 Rhythm: sinus Axis: normal Intervals: WNL PACs No STEMI No acute arrhythmia     Radiology  Personally reviewed available imaging myself.   CT head/CS/face - IMPRESSION:  Mildly depressed fractures of the distal nasal bones. No other acute  abnormality face, head or cervical spine.  Chronic microvascular ischemic change.  Cervical spondylosis.   CT A/P - IMPRESSION:  1. Severe descending and sigmoid colon diverticulosis. Faint focal  perisigmoid haziness may be chronic. Mild acute diverticulitis is  less likely but not excluded clinical correlation is recommended. No  bowel obstruction. Normal appendix.  2. Moderate size hiatal hernia.  3. Aortic Atherosclerosis (ICD10-I70.0).    Procedures  Procedure(s) performed (including critical care):  Procedures   Initial Impression / Assessment and Plan / MDM / ED Course  86 y.o. female who presents to the ED for an episode of vomiting and diarrhea, as well as near syncope with a pseudofall type situation and resultant nasal swelling nosebleed.  History as above.  Ddx: orthostatic syncope secondary to volume losses, defecation syncope, vasovagal, food poisoning, gastroenteritis, colitis, arrhythmia,  electrolyte abnormality, facial injury including fracture vs contusion  Will plan for labs, IV fluids, EKG, imaging, cardiac monitoring.   Clinical Course as of Jul 08 1908  Wed Jul 09, 2019  1705 Labs without actionable derangements, including negative troponin.  EKG without acute ischemia or acute arrhythmia aside from occasional PAC.  Creatinine 1.22, received fluids.  Urine negative for infection.  CT head/CS/face imaging reveals mildly depressed fractures of the distal nasal bones. No other acute abnormality face, head or cervical spine.  CT A/P concerning for possible mild sigmoid diverticulitis.  In  the setting of her symptoms, would opt to treat.  Will give first dose of oral Augmentin here and PO challenge.  If passes, anticipate discharge with oral course of antibiotics and outpatient follow-up.   [SM]  1757 Patient tolerated PO w/o difficulty and is feeling improved.  As such, feel patient is stable for discharge with outpatient follow-up.  She actually has GI follow-up already scheduled in a few weeks.  Advised adherence with this.  Given return precautions.  Patient and daughter at bedside both voiced understanding and are comfortable w/ plan and discharge.   [SM]    Clinical Course User Index [SM] Lilia Pro., MD     _______________________________   As part of my medical decision making I have reviewed available labs, radiology tests, reviewed old records/performed chart review, obtained additional history from family.    Final Clinical Impression(s) / ED Diagnosis  Final diagnoses:  Vomiting and diarrhea  Dehydration  Diverticulitis  Closed fracture of nasal bone, initial encounter       Note:  This document was prepared using Dragon voice recognition software and may include unintentional dictation errors.   Lilia Pro., MD 07/09/19 1910

## 2019-07-15 ENCOUNTER — Ambulatory Visit: Payer: Self-pay | Admitting: Family Medicine

## 2019-07-15 ENCOUNTER — Ambulatory Visit: Payer: Self-pay

## 2019-07-22 ENCOUNTER — Telehealth: Payer: Self-pay

## 2019-07-22 ENCOUNTER — Ambulatory Visit (INDEPENDENT_AMBULATORY_CARE_PROVIDER_SITE_OTHER): Payer: Medicare Other

## 2019-07-22 ENCOUNTER — Other Ambulatory Visit: Admission: RE | Admit: 2019-07-22 | Source: Ambulatory Visit

## 2019-07-22 ENCOUNTER — Other Ambulatory Visit: Payer: Self-pay

## 2019-07-22 DIAGNOSIS — E538 Deficiency of other specified B group vitamins: Secondary | ICD-10-CM | POA: Diagnosis not present

## 2019-07-22 MED ORDER — CYANOCOBALAMIN 1000 MCG/ML IJ SOLN
1000.0000 ug | Freq: Once | INTRAMUSCULAR | Status: AC
  Administered 2019-07-22: 1000 ug via INTRAMUSCULAR

## 2019-07-22 NOTE — Telephone Encounter (Signed)
Patient called and wants to cancel her EGD. Called patient back to find out the reason. Patient states she does not feel like she needs the procedure. Called ENDO and informed them to cancel the procedure

## 2019-07-24 ENCOUNTER — Encounter: Admission: RE | Payer: Self-pay | Source: Home / Self Care

## 2019-07-24 ENCOUNTER — Ambulatory Visit: Admission: RE | Admit: 2019-07-24 | Payer: Medicare Other | Source: Home / Self Care | Admitting: Gastroenterology

## 2019-07-24 SURGERY — ESOPHAGOGASTRODUODENOSCOPY (EGD) WITH PROPOFOL
Anesthesia: General

## 2019-07-29 ENCOUNTER — Ambulatory Visit: Payer: Self-pay | Admitting: Family Medicine

## 2019-08-07 ENCOUNTER — Other Ambulatory Visit: Payer: Self-pay | Admitting: Family Medicine

## 2019-08-09 ENCOUNTER — Other Ambulatory Visit: Payer: Self-pay | Admitting: Family Medicine

## 2019-08-09 DIAGNOSIS — J302 Other seasonal allergic rhinitis: Secondary | ICD-10-CM

## 2019-08-12 ENCOUNTER — Ambulatory Visit: Payer: Medicare Other | Admitting: Family Medicine

## 2019-08-15 NOTE — Progress Notes (Signed)
Established patient visit   Patient: Kathy Pacheco   DOB: [birth year]   86 y.o. Female  MRN: [id number] Visit Date: 08/19/2019  I,Angelissa Supan S Vin Yonke,acting as a scribe for [name], MD.,have documented all relevant documentation on the behalf of [name], MD,as directed by  [name], MD while in the presence of [name], MD.  Today's healthcare provider: [name], MD   Chief Complaint  Patient presents with  . Follow-up  . B12 Injection   Subjective    HPI  Patient is feeling better.  She states she feels better with the B12 shots.  She does not have a big appetite but she is not losing weight.  Energy level is improved with B12 shot. He has 1 daughter and 2 grandchildren.   Weight loss - 06/25/2019 He has now lost 20 pounds over the past couple of years.  This appears to have stabilized.  F/u  1 to 2 months.  Patient reports eating a big serving for breakfast only.  Wt Readings from Last 3 Encounters:  08/19/19 137 lb (62.1 kg)  07/09/19 140 lb (63.5 kg)  07/08/19 137 lb 4 oz (62.3 kg)     Vitamin B12 injection Last injection was on 07/22/2019. Patient is doing getting injection monthly. Patient reports feeling better.  Patient Active Problem List   Diagnosis Date Noted  . Chronic cough 12/17/2018  . Syncope 01/07/2017  . UTI (urinary tract infection) 01/07/2017  . Absolute anemia 11/03/2014  . Bilateral cataracts 11/03/2014  . COPD, mild ([place]) 11/03/2014  . Acute cystitis 11/03/2014  . 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 Esophagitis, reflux 11/03/2014  . Anxiety, generalized 11/03/2014  . Acid reflux 11/03/2014  . Hypercholesteremia 11/03/2014  . HLD (hyperlipidemia) 11/03/2014  . BP (high blood pressure) 11/03/2014  . Affective disorder, major 11/03/2014  . Menopausal and perimenopausal disorder 11/03/2014  . Obstructive apnea 11/03/2014  . Awareness of heartbeats 11/03/2014  . Hypercholesterolemia without  hypertriglyceridemia 11/03/2014  . Allergic rhinitis, seasonal 11/03/2014  . H/O cataract extraction 11/03/2014  . Anxiety 12/16/2013  . Apnea, sleep 12/16/2013   Social History   Tobacco Use  . Smoking status: Never Smoker  . Smokeless tobacco: Never Used  Vaping Use  . Vaping Use: Never used  Substance Use Topics  . Alcohol use: No    Alcohol/week: 0.0 standard drinks  . Drug use: No       Medications: Outpatient Medications Prior to Visit  Medication Sig  . acetaminophen (TYLENOL) 500 MG tablet Take 500 mg by mouth every 6 (six) hours as needed for mild pain.   Marland Kitchen albuterol (VENTOLIN HFA) 108 (90 Base) MCG/ACT inhaler Inhale 2 puffs into the lungs every 6 (six) hours as needed for wheezing or shortness of breath.  Marland Kitchen amLODipine (NORVASC) 5 MG tablet TAKE 1 TABLET (5 MG TOTAL) BY MOUTH DAILY.  . calcium carbonate (OS-CAL) 600 MG TABS tablet Take 600 mg by mouth. Takes occasionally  . cetirizine (ZYRTEC ALLERGY) 10 MG tablet Take 1 tablet (10 mg total) by mouth daily.  . citalopram (CELEXA) 10 MG tablet TAKE 1 TABLET (10 MG TOTAL) BY MOUTH DAILY.  Marland Kitchen FLOVENT HFA 110 MCG/ACT inhaler Inhale 2 puffs into the lungs 2 (two) times daily.  . fluticasone (FLONASE) 50 MCG/ACT nasal spray 2 SPRAYS EACH NOSTRIL TWICE DAILY  . omeprazole (PRILOSEC) 40 MG capsule Take 40 mg by mouth daily.  . ondansetron (ZOFRAN-ODT) 4 MG disintegrating tablet  Take 1 tablet (4 mg total) by mouth every 8 (eight) hours as needed for nausea or vomiting.   No facility-administered medications prior to visit.    Review of Systems  Constitutional: Positive for appetite change and unexpected weight change. Negative for chills.  Eyes: Negative.   Respiratory: Negative for chest tightness and shortness of breath.   Cardiovascular: Negative for chest pain and palpitations.  Gastrointestinal: Negative.   Endocrine: Negative.   Genitourinary: Negative.   Musculoskeletal: Negative.   Allergic/Immunologic: Negative.    Neurological: Negative.   Psychiatric/Behavioral: Negative.     Last vitamin B12 and Folate Lab Results  Component Value Date   VITAMINB12 194 (L) 06/12/2019      Objective    BP 134/75 (BP Location: Left Arm, Patient Position: Sitting, Cuff Size: Normal)   Pulse 73   Temp (!) 97.1 F (36.2 C) (Temporal)   Resp 16   Ht 5' (1.524 m)   Wt 137 lb (62.1 kg)   BMI 26.76 kg/m  BP Readings from Last 3 Encounters:  08/19/19 134/75  07/09/19 (!) 159/60  07/08/19 123/75   Wt Readings from Last 3 Encounters:  08/19/19 137 lb (62.1 kg)  07/09/19 140 lb (63.5 kg)  07/08/19 137 lb 4 oz (62.3 kg)      Physical Exam Vitals and nursing note reviewed.  Constitutional:      Appearance: Normal appearance.  HENT:     Right Ear: Tympanic membrane normal.     Left Ear: Tympanic membrane normal.     Nose: Nose normal.     Mouth/Throat:     Mouth: Mucous membranes are moist.     Pharynx: Oropharynx is clear.  Eyes:     Conjunctiva/sclera: Conjunctivae normal.  Cardiovascular:     Rate and Rhythm: Normal rate and regular rhythm.     Pulses: Normal pulses.     Heart sounds: Murmur heard.      Comments: 2 Over 6 systolic murmur noted Pulmonary:     Effort: Pulmonary effort is normal.     Breath sounds: Normal breath sounds.  Abdominal:     General: Bowel sounds are normal.     Palpations: Abdomen is soft.  Musculoskeletal:     Cervical back: Normal range of motion and neck supple.  Skin:    General: Skin is warm and dry.  Neurological:     General: No focal deficit present.     Mental Status: She is alert and oriented to person, place, and time.  Psychiatric:        Mood and Affect: Mood normal.        Behavior: Behavior normal.        Thought Content: Thought content normal.        Judgment: Judgment normal.       No results found for any visits on 08/19/19.  Assessment & Plan     1. B12 deficiency Continue monthly B12 shots as patient feels better since this is  started and her weight is stabilized - cyanocobalamin ((VITAMIN B-12)) injection 1,000 mcg  2. Weight loss Weight stable.  Follow-up in 2 months.  She wishes no further evaluation at this time.    3. Essential hypertension Controlled on amlodipine  4. Seasonal allergic rhinitis, unspecified trigger   5. Gastroesophageal reflux disease with esophagitis without hemorrhage  6.  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      I, [name], MD,  have reviewed all documentation for this visit. The documentation on 08/22/19 for the exam, diagnosis, procedures, and orders are all accurate and complete.    Richard Cranford Mon, MD  [facility] [phone] (phone) [phone] (fax)  [place]

## 2019-08-19 ENCOUNTER — Ambulatory Visit: Payer: Self-pay

## 2019-08-19 ENCOUNTER — Ambulatory Visit (INDEPENDENT_AMBULATORY_CARE_PROVIDER_SITE_OTHER): Payer: Medicare Other | Admitting: Family Medicine

## 2019-08-19 ENCOUNTER — Encounter: Payer: Self-pay | Admitting: Family Medicine

## 2019-08-19 ENCOUNTER — Ambulatory Visit: Payer: Medicare Other | Admitting: Gastroenterology

## 2019-08-19 ENCOUNTER — Other Ambulatory Visit: Payer: Self-pay

## 2019-08-19 ENCOUNTER — Encounter: Payer: Self-pay | Admitting: Gastroenterology

## 2019-08-19 VITALS — BP 134/75 | HR 73 | Temp 97.1°F | Resp 16 | Ht 60.0 in | Wt 137.0 lb

## 2019-08-19 DIAGNOSIS — R634 Abnormal weight loss: Secondary | ICD-10-CM

## 2019-08-19 DIAGNOSIS — J302 Other seasonal allergic rhinitis: Secondary | ICD-10-CM

## 2019-08-19 DIAGNOSIS — F3341 Major depressive disorder, recurrent, in partial remission: Secondary | ICD-10-CM

## 2019-08-19 DIAGNOSIS — I10 Essential (primary) hypertension: Secondary | ICD-10-CM

## 2019-08-19 DIAGNOSIS — K2100 Gastro-esophageal reflux disease with esophagitis, without bleeding: Secondary | ICD-10-CM | POA: Diagnosis not present

## 2019-08-19 DIAGNOSIS — E538 Deficiency of other specified B group vitamins: Secondary | ICD-10-CM

## 2019-08-19 MED ORDER — CYANOCOBALAMIN 1000 MCG/ML IJ SOLN
1000.0000 ug | Freq: Once | INTRAMUSCULAR | Status: AC
  Administered 2019-08-19: 1000 ug via INTRAMUSCULAR

## 2019-08-23 DIAGNOSIS — I959 Hypotension, unspecified: Secondary | ICD-10-CM | POA: Diagnosis not present

## 2019-08-23 DIAGNOSIS — R55 Syncope and collapse: Secondary | ICD-10-CM | POA: Diagnosis not present

## 2019-09-04 DIAGNOSIS — R1310 Dysphagia, unspecified: Secondary | ICD-10-CM | POA: Diagnosis not present

## 2019-09-04 DIAGNOSIS — K30 Functional dyspepsia: Secondary | ICD-10-CM | POA: Diagnosis not present

## 2019-09-04 DIAGNOSIS — R110 Nausea: Secondary | ICD-10-CM | POA: Diagnosis not present

## 2019-09-05 ENCOUNTER — Other Ambulatory Visit: Payer: Self-pay | Admitting: Student

## 2019-09-05 DIAGNOSIS — R1310 Dysphagia, unspecified: Secondary | ICD-10-CM

## 2019-09-05 DIAGNOSIS — R110 Nausea: Secondary | ICD-10-CM

## 2019-09-05 DIAGNOSIS — K30 Functional dyspepsia: Secondary | ICD-10-CM

## 2019-09-10 ENCOUNTER — Ambulatory Visit
Admission: RE | Admit: 2019-09-10 | Discharge: 2019-09-10 | Disposition: A | Discharge status: Home / Self Care | Payer: Medicare Other | Source: Ambulatory Visit | Attending: Student | Admitting: Student

## 2019-09-10 ENCOUNTER — Other Ambulatory Visit: Payer: Self-pay

## 2019-09-10 DIAGNOSIS — K219 Gastro-esophageal reflux disease without esophagitis: Secondary | ICD-10-CM | POA: Diagnosis not present

## 2019-09-10 DIAGNOSIS — K30 Functional dyspepsia: Secondary | ICD-10-CM | POA: Diagnosis not present

## 2019-09-10 DIAGNOSIS — R110 Nausea: Secondary | ICD-10-CM | POA: Diagnosis not present

## 2019-09-10 DIAGNOSIS — R1310 Dysphagia, unspecified: Secondary | ICD-10-CM

## 2019-09-16 ENCOUNTER — Other Ambulatory Visit: Payer: Self-pay

## 2019-09-16 ENCOUNTER — Ambulatory Visit (INDEPENDENT_AMBULATORY_CARE_PROVIDER_SITE_OTHER): Payer: Medicare Other

## 2019-09-16 ENCOUNTER — Ambulatory Visit: Payer: Medicare Other

## 2019-09-16 DIAGNOSIS — E538 Deficiency of other specified B group vitamins: Secondary | ICD-10-CM

## 2019-09-16 MED ORDER — CYANOCOBALAMIN 1000 MCG/ML IJ SOLN
1000.0000 ug | Freq: Once | INTRAMUSCULAR | Status: AC
Start: 2019-09-16 — End: 2019-09-16
  Administered 2019-09-16: 1000 ug via INTRAMUSCULAR

## 2019-10-14 ENCOUNTER — Ambulatory Visit: Payer: Medicare Other

## 2019-10-14 ENCOUNTER — Other Ambulatory Visit: Payer: Self-pay

## 2019-10-20 NOTE — Progress Notes (Signed)
Kathy Pacheco,acting as a scribe for [name], MD.,have documented all relevant documentation on the behalf of [name], MD,as directed by  [name], MD while in the presence of [name], MD.  Established patient visit   Patient: Kathy Pacheco   DOB: 01/02/1932   85 y.o. Female  MRN: [id number] Visit Date: 10/22/2019  Today's healthcare provider: [name], MD   Chief Complaint  Patient presents with  . Weight Loss  . B12 Injection   Subjective    HPI   Patient presents today in office for a 2 month f/u on weight loss.  Patient thinks she feels a little bit better since starting B12 shots.  She has no complaints today. Weight loss - 08/19/19 Weight stable.  Follow-up in 2 months.  She wishes no further evaluation at this time.       Medications: Outpatient Medications Prior to Visit  Medication Sig  . acetaminophen (TYLENOL) 500 MG tablet Take 500 mg by mouth every 6 (six) hours as needed for mild pain.   Marland Kitchen albuterol (VENTOLIN HFA) 108 (90 Base) MCG/ACT inhaler Inhale 2 puffs into the lungs every 6 (six) hours as needed for wheezing or shortness of breath.  Marland Kitchen amLODipine (NORVASC) 5 MG tablet TAKE 1 TABLET (5 MG TOTAL) BY MOUTH DAILY.  . calcium carbonate (OS-CAL) 600 MG TABS tablet Take 600 mg by mouth. Takes occasionally  . cetirizine (ZYRTEC ALLERGY) 10 MG tablet Take 1 tablet (10 mg total) by mouth daily.  . citalopram (CELEXA) 10 MG tablet TAKE 1 TABLET (10 MG TOTAL) BY MOUTH DAILY.  Marland Kitchen FLOVENT HFA 110 MCG/ACT inhaler Inhale 2 puffs into the lungs 2 (two) times daily.  . fluticasone (FLONASE) 50 MCG/ACT nasal spray 2 SPRAYS EACH NOSTRIL TWICE DAILY  . omeprazole (PRILOSEC) 40 MG capsule Take 40 mg by mouth daily.  . ondansetron (ZOFRAN-ODT) 4 MG disintegrating tablet Take 1 tablet (4 mg total) by mouth every 8 (eight) hours as needed for nausea or vomiting.   No facility-administered medications prior to visit.     Review of Systems     Objective    BP 119/67 (BP Location: Left Arm, Patient Position: Sitting, Cuff Size: Normal)   Pulse 85   Temp 98.1 F (36.7 C) (Oral)   Ht 5' (1.524 m)   Wt 137 lb 3.2 oz (62.2 kg)   BMI 26.80 kg/m  BP Readings from Last 3 Encounters:  10/22/19 119/67  08/19/19 134/75  07/09/19 (!) 159/60   Wt Readings from Last 3 Encounters:  10/22/19 137 lb 3.2 oz (62.2 kg)  08/19/19 137 lb (62.1 kg)  07/09/19 140 lb (63.5 kg)      Physical Exam Vitals reviewed.  Constitutional:      Appearance: Normal appearance.  HENT:     Head: Normocephalic and atraumatic.     Right Ear: External ear normal.     Left Ear: External ear normal.  Eyes:     General: No scleral icterus.    Conjunctiva/sclera: Conjunctivae normal.  Cardiovascular:     Rate and Rhythm: Normal rate and regular rhythm.     Pulses: Normal pulses.     Heart sounds: Normal heart sounds.  Pulmonary:     Effort: Pulmonary effort is normal.     Breath sounds: Normal breath sounds.  Musculoskeletal:     Right lower leg: No edema.     Left lower leg: No edema.  Skin:  General: Skin is warm and dry.  Neurological:     General: No focal deficit present.     Mental Status: She is alert and oriented to person, place, and time.  Psychiatric:        Mood and Affect: Mood normal.        Behavior: Behavior normal.        Thought Content: Thought content normal.        Judgment: Judgment normal.       No results found for any visits on 10/22/19.  Assessment & Plan     1. Weight loss Weight is now stable and patient asymptomatic.  Will follow clinically.  No further weight work-up indicated at this time. - CBC with Differential/Platelet - Comprehensive metabolic panel  2. B12 deficiency B12 shot monthly - cyanocobalamin ((VITAMIN B-12)) injection 1,000 mcg - CBC with Differential/Platelet - Comprehensive metabolic panel  3. Other hyperlipidemia  - CBC with Differential/Platelet -  Comprehensive metabolic panel  4. Anemia, unspecified type Borderline anemia.  Probably due to CKD - CBC with Differential/Platelet - Comprehensive metabolic panel  5. Chronic kidney disease, unspecified CKD stage  - CBC with Differential/Platelet - Comprehensive metabolic panel  6. Needs flu shot  - Flu Vaccine QUAD High Dose(Fluad)  No follow-ups on file.      I, [name], MD, have reviewed all documentation for this visit. The documentation on 10/27/19 for the exam, diagnosis, procedures, and orders are all accurate and complete.    Kathy Cranford Mon, MD  [facility] [phone] (phone) [phone] (fax)  [place]

## 2019-10-22 ENCOUNTER — Encounter: Payer: Self-pay | Admitting: Family Medicine

## 2019-10-22 ENCOUNTER — Ambulatory Visit (INDEPENDENT_AMBULATORY_CARE_PROVIDER_SITE_OTHER): Payer: Medicare Other | Admitting: Family Medicine

## 2019-10-22 ENCOUNTER — Other Ambulatory Visit: Payer: Self-pay

## 2019-10-22 VITALS — BP 119/67 | HR 85 | Temp 98.1°F | Ht 60.0 in | Wt 137.2 lb

## 2019-10-22 DIAGNOSIS — N189 Chronic kidney disease, unspecified: Secondary | ICD-10-CM | POA: Diagnosis not present

## 2019-10-22 DIAGNOSIS — Z23 Encounter for immunization: Secondary | ICD-10-CM | POA: Diagnosis not present

## 2019-10-22 DIAGNOSIS — D649 Anemia, unspecified: Secondary | ICD-10-CM | POA: Diagnosis not present

## 2019-10-22 DIAGNOSIS — R634 Abnormal weight loss: Secondary | ICD-10-CM | POA: Diagnosis not present

## 2019-10-22 DIAGNOSIS — E7849 Other hyperlipidemia: Secondary | ICD-10-CM | POA: Diagnosis not present

## 2019-10-22 DIAGNOSIS — E538 Deficiency of other specified B group vitamins: Secondary | ICD-10-CM

## 2019-10-22 MED ORDER — CYANOCOBALAMIN 1000 MCG/ML IJ SOLN
1000.0000 ug | Freq: Once | INTRAMUSCULAR | Status: AC
Start: 2019-10-22 — End: 2019-10-22
  Administered 2019-10-22: 1000 ug via INTRAMUSCULAR

## 2019-10-23 ENCOUNTER — Ambulatory Visit: Payer: Self-pay

## 2019-10-23 LAB — COMPREHENSIVE METABOLIC PANEL
ALT: 5 IU/L (ref 0–32)
AST: 9 IU/L (ref 0–40)
Albumin/Globulin Ratio: 1.2 (ref 1.2–2.2)
Albumin: 3.8 g/dL (ref 3.6–4.6)
Alkaline Phosphatase: 95 IU/L (ref 44–121)
BUN/Creatinine Ratio: 13 (ref 12–28)
BUN: 16 mg/dL (ref 8–27)
Bilirubin Total: 0.3 mg/dL (ref 0.0–1.2)
CO2: 24 mmol/L (ref 20–29)
Calcium: 9.4 mg/dL (ref 8.7–10.3)
Chloride: 101 mmol/L (ref 96–106)
Creatinine, Ser: 1.24 mg/dL — ABNORMAL HIGH (ref 0.57–1.00)
GFR calc Af Amer: 45 mL/min/{1.73_m2} — ABNORMAL LOW (ref >59)
GFR calc non Af Amer: 39 mL/min/{1.73_m2} — ABNORMAL LOW (ref >59)
Globulin, Total: 3.1 g/dL (ref 1.5–4.5)
Glucose: 78 mg/dL (ref 65–99)
Potassium: 4.1 mmol/L (ref 3.5–5.2)
Sodium: 138 mmol/L (ref 134–144)
Total Protein: 6.9 g/dL (ref 6.0–8.5)

## 2019-10-23 LAB — CBC WITH DIFFERENTIAL/PLATELET
Basophils Absolute: 0.1 10*3/uL (ref 0.0–0.2)
Basos: 2 %
EOS (ABSOLUTE): 0.5 10*3/uL — ABNORMAL HIGH (ref 0.0–0.4)
Eos: 8 %
Hematocrit: 34.8 % (ref 34.0–46.6)
Hemoglobin: 11 g/dL — ABNORMAL LOW (ref 11.1–15.9)
Immature Grans (Abs): 0 10*3/uL (ref 0.0–0.1)
Immature Granulocytes: 0 %
Lymphocytes Absolute: 1.6 10*3/uL (ref 0.7–3.1)
Lymphs: 23 %
MCH: 26.6 pg (ref 26.6–33.0)
MCHC: 31.6 g/dL (ref 31.5–35.7)
MCV: 84 fL (ref 79–97)
Monocytes Absolute: 0.8 10*3/uL (ref 0.1–0.9)
Monocytes: 12 %
Neutrophils Absolute: 3.7 10*3/uL (ref 1.4–7.0)
Neutrophils: 55 %
Platelets: 366 10*3/uL (ref 150–450)
RBC: 4.14 x10E6/uL (ref 3.77–5.28)
RDW: 16 % — ABNORMAL HIGH (ref 11.7–15.4)
WBC: 6.7 10*3/uL (ref 3.4–10.8)

## 2019-10-23 NOTE — Telephone Encounter (Signed)
Called to advise Kathy Pacheco that pantoprazole has been added to the list and omeprazole has been removed. Patient is due to have b12 injections monthly so her next one would be due Oct. 20th. LVMTCB.

## 2019-10-23 NOTE — Telephone Encounter (Signed)
Monthly. 

## 2019-10-23 NOTE — Telephone Encounter (Signed)
Called daughter on mobile number and left VM to return call to office. Per  Instruction will try patientdaughters home number. Attempted home number listed No answer. Calling to discuss her mothers stomach medication ordered by gastroenterologist.

## 2019-10-23 NOTE — Telephone Encounter (Signed)
Patients daughter called (on [place])  She was concerned because [name] failed to tell Kathy Pacheco yesterday that she saw Kathy Pacheco and on 09/24/19 her acid reflux medication was changed to Pantoprazole ( Protonix) 40 mg two times per day. She wants Kathy Pacheco aware and would like omeprazole removed from her mothers medication list.  She is also requesting that office get in touch with her mother to schedule her next B12. Please advise daughter Kathy Pacheco. Reason for Disposition . [1] Caller has NON-URGENT medicine question about med that PCP prescribed AND [2] triager unable to answer question  Answer Assessment - Initial Assessment Questions 1. NAME of MEDICATION: "What medicine are you calling about?"     Protonix  2. QUESTION: "What is your question?" (e.g., medication refill, side effect)     Did mother mention that Kathy Pacheco changed her to protonix 3. PRESCRIBING HCP: "Who prescribed it?" Reason: if prescribed by specialist, call should be referred to that group.     Kathy Pacheco 4. SYMPTOMS: "Do you have any symptoms?"   None 5. SEVERITY: If symptoms are present, ask "Are they mild, moderate or severe?"     N/A 6. PREGNANCY:  "Is there any chance that you are pregnant?" "When was your last menstrual period?"    N/A  Protocols used: MEDICATION QUESTION CALL-A-AH

## 2019-10-23 NOTE — Addendum Note (Signed)
Addended by: Gerald Stabs on: 10/23/2019 10:11 AM   Modules accepted: Orders

## 2019-10-23 NOTE — Telephone Encounter (Signed)
Pantoprazole now added to patient's medication list and omeprazole has been removed. Please advise how often patient is supposed to have b12 injection?

## 2019-10-24 ENCOUNTER — Telehealth: Payer: Self-pay

## 2019-10-24 NOTE — Telephone Encounter (Signed)
Patient advised of lab results via mychart and has read the providers comments.

## 2019-10-24 NOTE — Telephone Encounter (Signed)
-----   Message from Jerrol Banana., MD sent at 10/23/2019  4:35 PM EDT ----- Labs stable.

## 2019-10-28 NOTE — Telephone Encounter (Signed)
Judy advised 

## 2019-10-31 ENCOUNTER — Other Ambulatory Visit: Payer: Self-pay | Admitting: Family Medicine

## 2019-11-19 ENCOUNTER — Ambulatory Visit: Admitting: Dietician

## 2019-12-03 ENCOUNTER — Ambulatory Visit: Payer: Medicare Other

## 2019-12-15 ENCOUNTER — Other Ambulatory Visit: Payer: Self-pay

## 2019-12-15 ENCOUNTER — Emergency Department
Admission: EM | Admit: 2019-12-15 | Discharge: 2019-12-16 | Disposition: A | Discharge status: Home / Self Care | Payer: Medicare Other | Attending: Emergency Medicine | Admitting: Emergency Medicine

## 2019-12-15 DIAGNOSIS — K219 Gastro-esophageal reflux disease without esophagitis: Secondary | ICD-10-CM

## 2019-12-15 DIAGNOSIS — R110 Nausea: Secondary | ICD-10-CM | POA: Diagnosis not present

## 2019-12-15 DIAGNOSIS — R112 Nausea with vomiting, unspecified: Secondary | ICD-10-CM

## 2019-12-15 DIAGNOSIS — Z79899 Other long term (current) drug therapy: Secondary | ICD-10-CM | POA: Insufficient documentation

## 2019-12-15 DIAGNOSIS — Z7951 Long term (current) use of inhaled steroids: Secondary | ICD-10-CM | POA: Diagnosis not present

## 2019-12-15 DIAGNOSIS — T17920A Food in respiratory tract, part unspecified causing asphyxiation, initial encounter: Secondary | ICD-10-CM | POA: Diagnosis not present

## 2019-12-15 DIAGNOSIS — I10 Essential (primary) hypertension: Secondary | ICD-10-CM | POA: Insufficient documentation

## 2019-12-15 DIAGNOSIS — J449 Chronic obstructive pulmonary disease, unspecified: Secondary | ICD-10-CM | POA: Diagnosis not present

## 2019-12-15 DIAGNOSIS — R1111 Vomiting without nausea: Secondary | ICD-10-CM | POA: Diagnosis not present

## 2019-12-15 LAB — COMPREHENSIVE METABOLIC PANEL
ALT: 9 U/L (ref 0–44)
AST: 17 U/L (ref 15–41)
Albumin: 3.7 g/dL (ref 3.5–5.0)
Alkaline Phosphatase: 82 U/L (ref 38–126)
Anion gap: 11 (ref 5–15)
BUN: 16 mg/dL (ref 8–23)
CO2: 27 mmol/L (ref 22–32)
Calcium: 9.1 mg/dL (ref 8.9–10.3)
Chloride: 104 mmol/L (ref 98–111)
Creatinine, Ser: 1.15 mg/dL — ABNORMAL HIGH (ref 0.44–1.00)
GFR, Estimated: 46 mL/min — ABNORMAL LOW (ref >60)
Glucose, Bld: 109 mg/dL — ABNORMAL HIGH (ref 70–99)
Potassium: 4.2 mmol/L (ref 3.5–5.1)
Sodium: 142 mmol/L (ref 135–145)
Total Bilirubin: 0.9 mg/dL (ref 0.3–1.2)
Total Protein: 7.6 g/dL (ref 6.5–8.1)

## 2019-12-15 LAB — CBC
HCT: 35.4 % — ABNORMAL LOW (ref 36.0–46.0)
Hemoglobin: 11.4 g/dL — ABNORMAL LOW (ref 12.0–15.0)
MCH: 26.5 pg (ref 26.0–34.0)
MCHC: 32.2 g/dL (ref 30.0–36.0)
MCV: 82.3 fL (ref 80.0–100.0)
Platelets: 381 10*3/uL (ref 150–400)
RBC: 4.3 MIL/uL (ref 3.87–5.11)
RDW: 15.5 % (ref 11.5–15.5)
WBC: 7.7 10*3/uL (ref 4.0–10.5)
nRBC: 0 % (ref 0.0–0.2)

## 2019-12-15 LAB — LIPASE, BLOOD: Lipase: 17 U/L (ref 11–51)

## 2019-12-15 MED ORDER — ALUM & MAG HYDROXIDE-SIMETH 200-200-20 MG/5ML PO SUSP
30.0000 mL | Freq: Once | ORAL | Status: AC
  Administered 2019-12-15: 30 mL via ORAL
  Filled 2019-12-15: qty 30

## 2019-12-15 MED ORDER — ONDANSETRON HCL 4 MG/2ML IJ SOLN: 4.0000 mg | Freq: Once | INTRAMUSCULAR | Status: AC | PRN

## 2019-12-15 MED ORDER — SUCRALFATE 1 G PO TABS: 1.0000 g | ORAL_TABLET | Freq: Four times a day (QID) | ORAL | 1 refills | Status: DC | PRN

## 2019-12-15 MED ORDER — ONDANSETRON 4 MG PO TBDP
4.0000 mg | ORAL_TABLET | Freq: Once | ORAL | Status: AC
  Administered 2019-12-15: 4 mg via ORAL
  Filled 2019-12-15: qty 1

## 2019-12-15 MED ORDER — ONDANSETRON 4 MG PO TBDP: ORAL_TABLET | ORAL | 0 refills | Status: DC

## 2019-12-15 MED ORDER — ONDANSETRON HCL 4 MG/2ML IJ SOLN
INTRAMUSCULAR | Status: AC
  Administered 2019-12-15: 4 mg via INTRAVENOUS
  Filled 2019-12-15: qty 2

## 2019-12-15 NOTE — ED Triage Notes (Signed)
Pt comes into the ED via EMS from home with c/o N/V with burning pain in her throat, states she felt fine this morning and started feeling bad around 12pm today. 188/107, HR80, 97%RA, CBG 137, #18gLAC, 4mg  zofran given.

## 2019-12-15 NOTE — ED Triage Notes (Addendum)
Pt comes via EMS from home with c/o nausea and vomiting. Pt states this just started this am. Pt denies any belly pain. Pt currently vomiting in triage.  Pt states she just feels like if she could burp she would feel better.

## 2019-12-15 NOTE — ED Notes (Signed)
Meds given for nausea  Family with pt  Pt alert.

## 2019-12-15 NOTE — ED Provider Notes (Addendum)
[facility] Emergency Department Provider Note  ____________________________________________   First MD Initiated Contact with Patient 12/15/19 2319      (approximate)  I have reviewed the triage vital signs and the nursing notes.   HISTORY  Chief Complaint Nausea and Emesis    HPI Kathy Pacheco is a 86 y.o. female with medical history as listed below which notably includes severe GERD, anxiety, and chronic nausea according to her GI notes.  She presents tonight for evaluation of nausea and vomiting.  She also feels like she is choking sometimes.  These are not new issues but they flareup occasionally.  She has been seen by GI and has had a barium swallow that was normal.  She was told she has "severe acid reflux" and is supposed to take pantoprazole twice a day but her daughter, who is at bedside, reports that frequently she will miss the evening dose.  She has also been told multiple times that she should avoid things like tomatoes (which she has eaten recently) and that she should not lie down for at least 3 hours after eating, but she typically goes to breakfast with her friends and then comes home to lie down for a nap which she did today.  She has been waiting for more than 5 hours due to overwhelming ED and hospital patient volume, and she feels better now without the choking sensation and without vomiting but she still feels nauseated.  At no point today has she had any pain in her chest or her abdomen.  She denies fever, sore throat, difficulty speaking, shortness of breath, chest pain, abdominal pain, and dysuria.  Nothing in particular makes her symptoms better or worse and she described them as severe earlier and currently the nausea is mild.        Past Medical History:  Diagnosis Date  . Anxiety   . GERD (gastroesophageal reflux disease)   . High cholesterol   . Hypercholesteremia   . Hypertension   . PONV (postoperative nausea and vomiting)      Patient Active Problem List   Diagnosis Date Noted  . Chronic cough 12/17/2018  . Syncope 01/07/2017  . UTI (urinary tract infection) 01/07/2017  . Absolute anemia 11/03/2014  . Bilateral cataracts 11/03/2014  . COPD, mild ([place]) 11/03/2014  . Acute cystitis 11/03/2014  . 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 Esophagitis, reflux 11/03/2014  . Anxiety, generalized 11/03/2014  . Acid reflux 11/03/2014  . Hypercholesteremia 11/03/2014  . HLD (hyperlipidemia) 11/03/2014  . BP (high blood pressure) 11/03/2014  . Affective disorder, major 11/03/2014  . Menopausal and perimenopausal disorder 11/03/2014  . Obstructive apnea 11/03/2014  . Awareness of heartbeats 11/03/2014  . Hypercholesterolemia without hypertriglyceridemia 11/03/2014  . Allergic rhinitis, seasonal 11/03/2014  . H/O cataract extraction 11/03/2014  . Anxiety 12/16/2013  . Apnea, sleep 12/16/2013    Past Surgical History:  Procedure Laterality Date  . CATARACT EXTRACTION Bilateral   . ETHMOIDECTOMY Bilateral 03/10/2016   Procedure: ETHMOIDECTOMY;  Surgeon: Beverly Gust, MD;  Location: [place];  Service: ENT;  Laterality: Bilateral;  . FRONTAL SINUS EXPLORATION Bilateral 03/10/2016   Procedure: FRONTAL WITH TISSUE REMOVAL;  Surgeon: Beverly Gust, MD;  Location: [place];  Service: ENT;  Laterality: Bilateral;  . HERNIA REPAIR    . IMAGE GUIDED SINUS SURGERY Bilateral 03/10/2016   Procedure: IMAGE GUIDED SINUS SURGERY;  Surgeon: Beverly Gust, MD;  Location: [place];  Service: ENT;  Laterality: Bilateral;  Need Disk GAVE DISK TO CECE 1-25 KP  . MAXILLARY ANTROSTOMY Bilateral 03/10/2016   Procedure: MAXILLARY ANTROSTOMY;  Surgeon: Beverly Gust, MD;  Location: [place];  Service: ENT;  Laterality: Bilateral;  . NASAL TURBINATE REDUCTION Bilateral 03/10/2016   Procedure: TURBINATE REDUCTION/SUBMUCOSAL RESECTION;  Surgeon: Beverly Gust, MD;  Location: [place];   Service: ENT;  Laterality: Bilateral;  . SEPTOPLASTY Bilateral 03/10/2016   Procedure: SEPTOPLASTY;  Surgeon: Beverly Gust, MD;  Location: [place];  Service: ENT;  Laterality: Bilateral;  . SPHENOIDECTOMY Bilateral 03/10/2016   Procedure: SPHENOIDECTOMY WITH TISSUE REMOVAL;  Surgeon: Beverly Gust, MD;  Location: [place];  Service: ENT;  Laterality: Bilateral;    Prior to Admission medications   Medication Sig Start Date End Date Taking? Authorizing Provider  acetaminophen (TYLENOL) 500 MG tablet Take 500 mg by mouth every 6 (six) hours as needed for mild pain.     [provider]  albuterol (VENTOLIN HFA) 108 (90 Base) MCG/ACT inhaler Inhale 2 puffs into the lungs every 6 (six) hours as needed for wheezing or shortness of breath. 12/17/18   Parrett, Tammy S, NP  amLODipine (NORVASC) 5 MG tablet TAKE 1 TABLET (5 MG TOTAL) BY MOUTH DAILY. 10/31/19   Jerrol Banana., MD  calcium carbonate (OS-CAL) 600 MG TABS tablet Take 600 mg by mouth. Takes occasionally    [provider]  cetirizine (ZYRTEC ALLERGY) 10 MG tablet Take 1 tablet (10 mg total) by mouth daily. 08/22/17   Flora Lipps, MD  citalopram (CELEXA) 10 MG tablet TAKE 1 TABLET (10 MG TOTAL) BY MOUTH DAILY. 08/07/19   Jerrol Banana., MD  FLOVENT HFA 110 MCG/ACT inhaler Inhale 2 puffs into the lungs 2 (two) times daily. 12/17/18   Parrett, Fonnie Mu, NP  fluticasone (FLONASE) 50 MCG/ACT nasal spray 2 SPRAYS EACH NOSTRIL TWICE DAILY 07/04/16   [provider]  ondansetron (ZOFRAN ODT) 4 MG disintegrating tablet Allow 1-2 tablets to dissolve in your mouth every 8 hours as needed for nausea/vomiting 12/15/19   Hinda Kehr, MD  pantoprazole (PROTONIX) 40 MG tablet TAKE 1 TABLET BY MOUTH TWICE A DAY 30 MIN BEFORE BREAKFAST AND 30 MIN BEFORE DINNER 10/17/19   [provider]  sucralfate (CARAFATE) 1 g tablet Take 1 tablet (1 g total) by mouth 4 (four) times daily as needed (for  abdominal discomfort, nausea, and/or vomiting). 12/15/19   Hinda Kehr, MD    Allergies Ciprofloxacin  Family History  Problem Relation Age of Onset  . Epilepsy Mother   . Osteoporosis Mother   . COPD Father   . Hypertension Father   . CVA Father   . Allergies Father   . Lung cancer Father   . Emphysema Father   . Cancer Sister        breast  . Breast cancer Sister 29  . CVA Brother   . COPD Brother   . Heart disease Brother   . Arthritis Brother   . CVA Brother   . Cancer Brother        cancer  . Healthy Brother   . Cancer Maternal Grandmother   . Hypertension Maternal Grandmother   . Healthy Brother   . Osteoporosis Sister   . COPD Sister     Social History Social History   Tobacco Use  . Smoking status: Never Smoker  . Smokeless tobacco: Never Used  Vaping Use  . Vaping Use: Never used  Substance Use Topics  .  Alcohol use: No    Alcohol/week: 0.0 standard drinks  . Drug use: No    Review of Systems Constitutional: No fever/chills Eyes: No visual changes. ENT: No sore throat.  Sensation of choking when she tries to eat. Cardiovascular: Denies chest pain. Respiratory: Denies shortness of breath. Gastrointestinal: Nausea and vomiting.  No abdominal pain. Genitourinary: Negative for dysuria. Musculoskeletal: Negative for neck pain.  Negative for back pain. Integumentary: Negative for rash. Neurological: Negative for headaches, focal weakness or numbness.   ____________________________________________   PHYSICAL EXAM:  VITAL SIGNS: ED Triage Vitals  Enc Vitals Group     BP 12/15/19 1848 (!) 154/91     Pulse Rate 12/15/19 1848 74     Resp 12/15/19 1848 18     Temp 12/15/19 1848 98.4 F (36.9 C)     Temp Source 12/15/19 1848 Oral     SpO2 12/15/19 1848 95 %     Weight --      Height --      Head Circumference --      Peak Flow --      Pain Score 12/15/19 1851 0     Pain Loc --      Pain Edu? --      Excl. in [place]? --     Constitutional:  Alert and oriented.  Eyes: Conjunctivae are normal.  Head: Atraumatic. Nose: No congestion/rhinnorhea. Mouth/Throat: Patient is wearing a mask.  Oropharynx is normal in appearance with no swelling and no evidence of airway obstruction.  Patient is tolerating her secretions without difficulty and has a normal voice and no dysphagia. Neck: No stridor.  No meningeal signs.  No palpable lymphadenopathy, no brawny induration beneath her mandible, no tenderness to external manipulation of the larynx/trachea. Cardiovascular: Normal rate, regular rhythm. Good peripheral circulation. Grossly normal heart sounds. Respiratory: Normal respiratory effort.  No retractions. Gastrointestinal: Soft and nontender. No distention.  Musculoskeletal: No lower extremity tenderness nor edema. No gross deformities of extremities. Neurologic:  Normal speech and language. No gross focal neurologic deficits are appreciated.  Skin:  Skin is warm, dry and intact. Psychiatric: Mood and affect are a little bit anxious but generally appropriate and the anxiety is understandable under the circumstances.  ____________________________________________   LABS (all labs ordered are listed, but only abnormal results are displayed)  Labs Reviewed  COMPREHENSIVE METABOLIC PANEL - Abnormal; Notable for the following components:      Result Value   Glucose, Bld 109 (*)    Creatinine, Ser 1.15 (*)    GFR, Estimated 46 (*)    All other components within normal limits  CBC - Abnormal; Notable for the following components:   Hemoglobin 11.4 (*)    HCT 35.4 (*)    All other components within normal limits  LIPASE, BLOOD  URINALYSIS, COMPLETE (UACMP) WITH MICROSCOPIC   ____________________________________________  EKG  No indication for emergent EKG ____________________________________________  RADIOLOGY I, Hinda Kehr, personally viewed and evaluated these images (plain radiographs) as part of my medical decision making,  as well as reviewing the written report by the radiologist.  ED MD interpretation: No indication for emergent imaging  Official radiology report(s): No results found.  ____________________________________________   PROCEDURES   Procedure(s) performed (including Critical Care):  Procedures   ____________________________________________   INITIAL IMPRESSION / MDM / [place] / ED COURSE  As part of my medical decision making, I reviewed the following data within the [place] History obtained from family, Nursing notes reviewed  and incorporated, Labs reviewed , Old chart reviewed and Notes from prior ED visits   Differential diagnosis includes, but is not limited to, acid reflux, achalasia, esophageal dysmotility, esophageal web, atypical ACS presentation, SBO/ileus, pharyngitis/tonsillitis/epiglottitis.  The patient's vital signs have been stable throughout her 5+ hours in the emergency department other than some hypertension.  She is tolerating her secretions, speaking clearly and easily, swallowing without difficulty, and has no odynophagia.  Her physical exam is reassuring with no pharyngeal or neck abnormalities appreciated, and no tenderness to palpation of her abdomen at all including in the epigastrium, right upper quadrant, or lower abdomen.  She also has no abdominal distention.  Her lab work is essentially normal with no electrolyte abnormalities, a creatinine of 1.15 which is within range of normal for her (and in fact is better than it has been in the past), a normal CBC with no leukocytosis, and normal lipase.  She has no urinary symptoms and no chest pain or shortness of breath.  I reviewed her medical history and she has extensive notes from gastroenterology and has had prior swallowing studies and extensive evaluation by GI which notes her chronic nausea and severe reflux.  I think it would be incredibly unlikely for this to be an atypical ACS  presentation.  Given that she is having no chest pain and symptoms consistent with her chronic GI issues, there is no indication for a high-sensitivity troponin.  I had a long conversation with her and her daughter about her symptoms.  We discussed various treatment options including additional antiemetics, GI cocktail with or without viscous lidocaine, Carafate, etc.  I explained that there is no evidence of an emergent medical condition tonight and that even though she symptoms feels like she is choking, there is no sign that there is anything emergent or new or different going on.  She would prefer not to try the viscous lidocaine for fear that she will not like the sensation of having her mouth and throat numb, but would like to try Zofran 4 mg ODT and a dose of Maalox.  I will write her prescription for Zofran and Carafate and encouraged her to continue taking her pantoprazole as previously prescribed and follow-up with GI at the next available opportunity.  She tolerated a p.o. challenge in the ED and there is no evidence that she would benefit from additional emergent evaluation or stay in the hospital.  She and her daughter understand and agree with the plan and the need for close follow-up and I gave my usual and customary return precautions.          ____________________________________________  FINAL CLINICAL IMPRESSION(S) / ED DIAGNOSES  Final diagnoses:  Gastroesophageal reflux disease, unspecified whether esophagitis present  Non-intractable vomiting with nausea, unspecified vomiting type     MEDICATIONS GIVEN DURING THIS VISIT:  Medications  ondansetron (ZOFRAN) injection 4 mg (4 mg Intravenous Given 12/15/19 1854)  ondansetron (ZOFRAN-ODT) disintegrating tablet 4 mg (4 mg Oral Given 12/15/19 2343)  alum & mag hydroxide-simeth (MAALOX/MYLANTA) 200-200-20 MG/5ML suspension 30 mL (30 mLs Oral Given 12/15/19 2344)     ED Discharge Orders         Ordered    ondansetron (ZOFRAN  ODT) 4 MG disintegrating tablet        12/15/19 2356    sucralfate (CARAFATE) 1 g tablet  4 times daily PRN        12/15/19 2356          *Please note:  Danton Clap  B Hudock was evaluated in Emergency Department on 12/15/2019 for the symptoms described in the history of present illness. She was evaluated in the context of the global COVID-19 pandemic, which necessitated consideration that the patient might be at risk for infection with the SARS-CoV-2 virus that causes COVID-19. Institutional protocols and algorithms that pertain to the evaluation of patients at risk for COVID-19 are in a state of rapid change based on information released by regulatory bodies including the CDC and federal and state organizations. These policies and algorithms were followed during the patient's care in the ED.  Some ED evaluations and interventions may be delayed as a result of limited staffing during and after the pandemic.*  Note:  This document was prepared using Dragon voice recognition software and may include unintentional dictation errors.   Hinda Kehr, MD 12/15/19 Emmit Pomfret    Hinda Kehr, MD 12/15/19 2357

## 2019-12-15 NOTE — Discharge Instructions (Addendum)
Your workup in the Emergency Department today was reassuring.  We did not find any specific abnormalities.  We recommend you drink plenty of fluids, take your regular medications and/or any new ones prescribed today, and follow up with the doctor(s) listed in these documents as recommended.  You may want to ask the pharmacist about the best way to time your pantoprazole (Protonix) with the new medication prescribed tonight (carafate).  Remember to avoid your trigger foods like tomatoes, and try not to lie down within at least 3 hours after eating.  Return to the Emergency Department if you develop new or worsening symptoms that concern you.

## 2019-12-27 ENCOUNTER — Encounter: Payer: Self-pay | Admitting: Emergency Medicine

## 2019-12-27 ENCOUNTER — Emergency Department
Admission: EM | Admit: 2019-12-27 | Discharge: 2019-12-27 | Disposition: A | Discharge status: Home / Self Care | Payer: Medicare Other | Attending: Emergency Medicine | Admitting: Emergency Medicine

## 2019-12-27 ENCOUNTER — Emergency Department: Payer: Medicare Other

## 2019-12-27 ENCOUNTER — Other Ambulatory Visit: Payer: Self-pay

## 2019-12-27 DIAGNOSIS — S0083XA Contusion of other part of head, initial encounter: Secondary | ICD-10-CM | POA: Insufficient documentation

## 2019-12-27 DIAGNOSIS — Y999 Unspecified external cause status: Secondary | ICD-10-CM | POA: Diagnosis not present

## 2019-12-27 DIAGNOSIS — I10 Essential (primary) hypertension: Secondary | ICD-10-CM | POA: Insufficient documentation

## 2019-12-27 DIAGNOSIS — W01198A Fall on same level from slipping, tripping and stumbling with subsequent striking against other object, initial encounter: Secondary | ICD-10-CM | POA: Diagnosis not present

## 2019-12-27 DIAGNOSIS — Y939 Activity, unspecified: Secondary | ICD-10-CM | POA: Insufficient documentation

## 2019-12-27 DIAGNOSIS — W19XXXA Unspecified fall, initial encounter: Secondary | ICD-10-CM

## 2019-12-27 DIAGNOSIS — Y92480 Sidewalk as the place of occurrence of the external cause: Secondary | ICD-10-CM | POA: Diagnosis not present

## 2019-12-27 DIAGNOSIS — S0121XA Laceration without foreign body of nose, initial encounter: Secondary | ICD-10-CM | POA: Diagnosis not present

## 2019-12-27 DIAGNOSIS — Z79899 Other long term (current) drug therapy: Secondary | ICD-10-CM | POA: Diagnosis not present

## 2019-12-27 DIAGNOSIS — Z043 Encounter for examination and observation following other accident: Secondary | ICD-10-CM | POA: Diagnosis not present

## 2019-12-27 DIAGNOSIS — S0990XA Unspecified injury of head, initial encounter: Secondary | ICD-10-CM | POA: Diagnosis present

## 2019-12-27 MED ORDER — AMOXICILLIN 500 MG PO CAPS: 500.0000 mg | ORAL_CAPSULE | Freq: Three times a day (TID) | ORAL | 0 refills | Status: DC

## 2019-12-27 NOTE — ED Provider Notes (Signed)
[facility] Emergency Department Provider Note  ____________________________________________   First MD Initiated Contact with Patient 12/27/19 1708     (approximate)  I have reviewed the triage vital signs and the nursing notes.   HISTORY  Chief Complaint Fall    HPI Kathy Pacheco is a 86 y.o. female presents emergency department after a fall.  Patient states she was walking on the sidewalk towards the [place] when she tripped on the sidewalk and fell landing on her face.  Patient states she broke a tooth.  No LOC.  She is denying any extremity pain.    Past Medical History:  Diagnosis Date  . Anxiety   . GERD (gastroesophageal reflux disease)   . High cholesterol   . Hypercholesteremia   . Hypertension   . PONV (postoperative nausea and vomiting)     Patient Active Problem List   Diagnosis Date Noted  . Chronic cough 12/17/2018  . Syncope 01/07/2017  . UTI (urinary tract infection) 01/07/2017  . Absolute anemia 11/03/2014  . Bilateral cataracts 11/03/2014  . COPD, mild ([place]) 11/03/2014  . Acute cystitis 11/03/2014  . 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 Esophagitis, reflux 11/03/2014  . Anxiety, generalized 11/03/2014  . Acid reflux 11/03/2014  . Hypercholesteremia 11/03/2014  . HLD (hyperlipidemia) 11/03/2014  . BP (high blood pressure) 11/03/2014  . Affective disorder, major 11/03/2014  . Menopausal and perimenopausal disorder 11/03/2014  . Obstructive apnea 11/03/2014  . Awareness of heartbeats 11/03/2014  . Hypercholesterolemia without hypertriglyceridemia 11/03/2014  . Allergic rhinitis, seasonal 11/03/2014  . H/O cataract extraction 11/03/2014  . Anxiety 12/16/2013  . Apnea, sleep 12/16/2013    Past Surgical History:  Procedure Laterality Date  . CATARACT EXTRACTION Bilateral   . ETHMOIDECTOMY Bilateral 03/10/2016   Procedure: ETHMOIDECTOMY;  Surgeon: Beverly Gust, MD;  Location: [place];  Service: ENT;   Laterality: Bilateral;  . FRONTAL SINUS EXPLORATION Bilateral 03/10/2016   Procedure: FRONTAL WITH TISSUE REMOVAL;  Surgeon: Beverly Gust, MD;  Location: [place];  Service: ENT;  Laterality: Bilateral;  . HERNIA REPAIR    . IMAGE GUIDED SINUS SURGERY Bilateral 03/10/2016   Procedure: IMAGE GUIDED SINUS SURGERY;  Surgeon: Beverly Gust, MD;  Location: [place];  Service: ENT;  Laterality: Bilateral;  Need Disk GAVE DISK TO CECE 1-25 KP  . MAXILLARY ANTROSTOMY Bilateral 03/10/2016   Procedure: MAXILLARY ANTROSTOMY;  Surgeon: Beverly Gust, MD;  Location: [place];  Service: ENT;  Laterality: Bilateral;  . NASAL TURBINATE REDUCTION Bilateral 03/10/2016   Procedure: TURBINATE REDUCTION/SUBMUCOSAL RESECTION;  Surgeon: Beverly Gust, MD;  Location: [place];  Service: ENT;  Laterality: Bilateral;  . SEPTOPLASTY Bilateral 03/10/2016   Procedure: SEPTOPLASTY;  Surgeon: Beverly Gust, MD;  Location: [place];  Service: ENT;  Laterality: Bilateral;  . SPHENOIDECTOMY Bilateral 03/10/2016   Procedure: SPHENOIDECTOMY WITH TISSUE REMOVAL;  Surgeon: Beverly Gust, MD;  Location: [place];  Service: ENT;  Laterality: Bilateral;    Prior to Admission medications   Medication Sig Start Date End Date Taking? Authorizing Provider  acetaminophen (TYLENOL) 500 MG tablet Take 500 mg by mouth every 6 (six) hours as needed for mild pain.     [provider]  albuterol (VENTOLIN HFA) 108 (90 Base) MCG/ACT inhaler Inhale 2 puffs into the lungs every 6 (six) hours as needed for wheezing or shortness of breath. 12/17/18   Parrett, Fonnie Mu, NP  amLODipine (NORVASC) 5 MG tablet TAKE 1 TABLET (5  MG TOTAL) BY MOUTH DAILY. 10/31/19   Jerrol Banana., MD  amoxicillin (AMOXIL) 500 MG capsule Take 1 capsule (500 mg total) by mouth 3 (three) times daily. 12/27/19   Carrson Lightcap, Linden Dolin, PA-C  calcium carbonate (OS-CAL) 600 MG TABS tablet Take 600 mg by  mouth. Takes occasionally    [provider]  cetirizine (ZYRTEC ALLERGY) 10 MG tablet Take 1 tablet (10 mg total) by mouth daily. 08/22/17   Flora Lipps, MD  citalopram (CELEXA) 10 MG tablet TAKE 1 TABLET (10 MG TOTAL) BY MOUTH DAILY. 08/07/19   Jerrol Banana., MD  FLOVENT HFA 110 MCG/ACT inhaler Inhale 2 puffs into the lungs 2 (two) times daily. 12/17/18   Parrett, Fonnie Mu, NP  fluticasone (FLONASE) 50 MCG/ACT nasal spray 2 SPRAYS EACH NOSTRIL TWICE DAILY 07/04/16   [provider]  ondansetron (ZOFRAN ODT) 4 MG disintegrating tablet Allow 1-2 tablets to dissolve in your mouth every 8 hours as needed for nausea/vomiting 12/15/19   Hinda Kehr, MD  pantoprazole (PROTONIX) 40 MG tablet TAKE 1 TABLET BY MOUTH TWICE A DAY 30 MIN BEFORE BREAKFAST AND 30 MIN BEFORE DINNER 10/17/19   [provider]  sucralfate (CARAFATE) 1 g tablet Take 1 tablet (1 g total) by mouth 4 (four) times daily as needed (for abdominal discomfort, nausea, and/or vomiting). 12/15/19   Hinda Kehr, MD    Allergies Ciprofloxacin  Family History  Problem Relation Age of Onset  . Epilepsy Mother   . Osteoporosis Mother   . COPD Father   . Hypertension Father   . CVA Father   . Allergies Father   . Lung cancer Father   . Emphysema Father   . Cancer Sister        breast  . Breast cancer Sister 7  . CVA Brother   . COPD Brother   . Heart disease Brother   . Arthritis Brother   . CVA Brother   . Cancer Brother        cancer  . Healthy Brother   . Cancer Maternal Grandmother   . Hypertension Maternal Grandmother   . Healthy Brother   . Osteoporosis Sister   . COPD Sister     Social History Social History   Tobacco Use  . Smoking status: Never Smoker  . Smokeless tobacco: Never Used  Vaping Use  . Vaping Use: Never used  Substance Use Topics  . Alcohol use: No    Alcohol/week: 0.0 standard drinks  . Drug use: No    Review of Systems  Constitutional: No  fever/chills Eyes: No visual changes. ENT: No sore throat. Respiratory: Denies cough Cardiovascular: Denies chest pain Gastrointestinal: Denies abdominal pain Genitourinary: Negative for dysuria. Musculoskeletal: Negative for back pain. Skin: Negative for rash. Psychiatric: no mood changes,     ____________________________________________   PHYSICAL EXAM:  VITAL SIGNS: ED Triage Vitals  Enc Vitals Group     BP 12/27/19 1536 130/86     Pulse Rate 12/27/19 1536 93     Resp 12/27/19 1536 20     Temp 12/27/19 1536 (!) 97.5 F (36.4 C)     Temp Source 12/27/19 1536 Oral     SpO2 12/27/19 1536 100 %     Weight 12/27/19 1537 140 lb (63.5 kg)     Height 12/27/19 1537 5\' 3"  (1.6 m)     Head Circumference --      Peak Flow --      Pain Score 12/27/19 1537  0     Pain Loc --      Pain Edu? --      Excl. in [place]? --     Constitutional: Alert and oriented. Well appearing and in no acute distress. Eyes: Conjunctivae are normal.  Head: Left side of the patient's face has multiple abrasions noted, swelling noted at the nose and left cheek. Nose: No congestion/rhinnorhea. Mouth/Throat: Mucous membranes are moist.  Fractured tooth noted at the left front tooth. Neck:  supple no lymphadenopathy noted Cardiovascular: Normal rate, regular rhythm. Heart sounds are normal Respiratory: Normal respiratory effort.  No retractions,  GU: deferred Musculoskeletal: FROM all extremities, warm and well perfused, extremities are nontender, hips are nontender, spine is nontender Neurologic:  Normal speech and language.  Skin:  Skin is warm, dry, multiple abrasions noted to the left side of the face. No rash noted. Psychiatric: Mood and affect are normal. Speech and behavior are normal.  ____________________________________________   LABS (all labs ordered are listed, but only abnormal results are displayed)  Labs Reviewed - No data to  display ____________________________________________   ____________________________________________  RADIOLOGY  CT of the head, C-spine, maxillofacial does not show any new fractures.  Suspected fracture of the left maxillary sinus with fluid.  ____________________________________________   PROCEDURES  Procedure(s) performed: No  Procedures    ____________________________________________   INITIAL IMPRESSION / ASSESSMENT AND PLAN / ED COURSE  Pertinent labs & imaging results that were available during my care of the patient were reviewed by me and considered in my medical decision making (see chart for details).   Patient is an 86 year old female presents after a fall.  See HPI.  Physical exam shows patient to appear well.  Stable.  Multiple abrasions noted to the left side of the face along with swollen nose.  Remainder the exam is unremarkable  CT of the head, maxillofacial, and C-spine did not show any acute abnormalities.  Suspected maxillary sinus fracture on the left with fluid buildup.  I did discuss the findings with the patient and her daughter.  Due to the questionable maxillary sinus fracture I did place her on a antibiotic to prevent infection.  She is to follow-up with Dr. Tami Ribas who she is already established with to have the area evaluated.  Return emergency department worsening.  Apply ice to all areas that hurt.  She was discharged in stable condition in the care of her daughter.  Patient states her Tdap is up-to-date.     Kathy Pacheco was evaluated in Emergency Department on 12/27/2019 for the symptoms described in the history of present illness. She was evaluated in the context of the global COVID-19 pandemic, which necessitated consideration that the patient might be at risk for infection with the SARS-CoV-2 virus that causes COVID-19. Institutional protocols and algorithms that pertain to the evaluation of patients at risk for COVID-19 are in a state of  rapid change based on information released by regulatory bodies including the CDC and federal and state organizations. These policies and algorithms were followed during the patient's care in the ED.    As part of my medical decision making, I reviewed the following data within the [place] History obtained from family, Nursing notes reviewed and incorporated, Old chart reviewed, Radiograph reviewed , Notes from prior ED visits and [place] Controlled Substance Database  ____________________________________________   FINAL CLINICAL IMPRESSION(S) / ED DIAGNOSES  Final diagnoses:  Fall, initial encounter  Contusion of face, initial encounter  NEW MEDICATIONS STARTED DURING THIS VISIT:  Discharge Medication List as of 12/27/2019  5:29 PM    START taking these medications   Details  amoxicillin (AMOXIL) 500 MG capsule Take 1 capsule (500 mg total) by mouth 3 (three) times daily., Starting Sat 12/27/2019, Normal         Note:  This document was prepared using Dragon voice recognition software and may include unintentional dictation errors.    Versie Starks, PA-C 12/27/19 2004    Nance Pear, MD 12/27/19 2026

## 2019-12-27 NOTE — ED Notes (Signed)
D/w Dr. Archie Balboa, new orders received for CT scans.

## 2019-12-27 NOTE — Discharge Instructions (Addendum)
Follow-up with Dr. Tami Ribas, please call him for an appointment.  Take the antibiotic as prescribed.  Return the emergency department for worsening

## 2019-12-27 NOTE — ED Triage Notes (Addendum)
Pt arrived via POV with reports of tripping over a walk way, pt states she fell on to the left side of face, pt has abrasions to left cheek, left eye and left eyebrow. Pt reports she does have some blood draining from nose as well.   Pt did break a tooth off as well.   Denies any neck pain, denies any LOC.

## 2020-01-06 DIAGNOSIS — H903 Sensorineural hearing loss, bilateral: Secondary | ICD-10-CM | POA: Diagnosis not present

## 2020-02-02 DIAGNOSIS — K219 Gastro-esophageal reflux disease without esophagitis: Secondary | ICD-10-CM | POA: Diagnosis not present

## 2020-02-17 NOTE — Progress Notes (Signed)
Subjective:   Kathy Pacheco is a 87 y.o. female who presents for Medicare Annual (Subsequent) preventive examination.  I connected with Robbie Lis today by telephone and verified that I am speaking with the correct person using two identifiers. Location patient: home Location provider: work Persons participating in the virtual visit: patient, provider.   I discussed the limitations, risks, security and privacy concerns of performing an evaluation and management service by telephone and the availability of in person appointments. I also discussed with the patient that there may be a patient responsible charge related to this service. The patient expressed understanding and verbally consented to this telephonic visit.    Interactive audio and video telecommunications were attempted between this provider and patient, however failed, due to patient having technical difficulties OR patient did not have access to video capability.  We continued and completed visit with audio only.   Review of Systems    N/A  Cardiac Risk Factors include: advanced age (>74men, >46 women);hypertension     Objective:    There were no vitals filed for this visit. There is no height or weight on file to calculate BMI.  Advanced Directives 02/18/2020 12/27/2019 12/15/2019 07/09/2019 05/07/2019 02/17/2019 02/07/2018  Does Patient Have a Medical Advance Directive? No No No No No No No  Would patient like information on creating a medical advance directive? No - Patient declined No - Patient declined - - - No - Patient declined No - Patient declined    Current Medications (verified) Outpatient Encounter Medications as of 02/18/2020  Medication Sig  . acetaminophen (TYLENOL) 500 MG tablet Take 500 mg by mouth every 6 (six) hours as needed for mild pain.   Marland Kitchen albuterol (VENTOLIN HFA) 108 (90 Base) MCG/ACT inhaler Inhale 2 puffs into the lungs every 6 (six) hours as needed for wheezing or shortness of breath.  Marland Kitchen  amLODipine (NORVASC) 5 MG tablet TAKE 1 TABLET (5 MG TOTAL) BY MOUTH DAILY.  . fluticasone (FLONASE) 50 MCG/ACT nasal spray 2 SPRAYS EACH NOSTRIL TWICE DAILY  . ondansetron (ZOFRAN ODT) 4 MG disintegrating tablet Allow 1-2 tablets to dissolve in your mouth every 8 hours as needed for nausea/vomiting  . pantoprazole (PROTONIX) 40 MG tablet TAKE 1 TABLET BY MOUTH TWICE A DAY 30 MIN BEFORE BREAKFAST AND 30 MIN BEFORE DINNER  . sucralfate (CARAFATE) 1 g tablet Take 1 tablet (1 g total) by mouth 4 (four) times daily as needed (for abdominal discomfort, nausea, and/or vomiting).  Marland Kitchen amoxicillin (AMOXIL) 500 MG capsule Take 1 capsule (500 mg total) by mouth 3 (three) times daily.  . calcium carbonate (OS-CAL) 600 MG TABS tablet Take 600 mg by mouth. Takes occasionally (Patient not taking: Reported on 02/18/2020)  . cetirizine (ZYRTEC ALLERGY) 10 MG tablet Take 1 tablet (10 mg total) by mouth daily. (Patient not taking: Reported on 02/18/2020)  . citalopram (CELEXA) 10 MG tablet TAKE 1 TABLET (10 MG TOTAL) BY MOUTH DAILY. (Patient not taking: Reported on 02/18/2020)  . FLOVENT HFA 110 MCG/ACT inhaler Inhale 2 puffs into the lungs 2 (two) times daily. (Patient not taking: Reported on 02/18/2020)   No facility-administered encounter medications on file as of 02/18/2020.    Allergies (verified) Ciprofloxacin   History: Past Medical History:  Diagnosis Date  . Anxiety   . GERD (gastroesophageal reflux disease)   . Hypertension   . PONV (postoperative nausea and vomiting)    Past Surgical History:  Procedure Laterality Date  . CATARACT EXTRACTION Bilateral   .  ETHMOIDECTOMY Bilateral 03/10/2016   Procedure: ETHMOIDECTOMY;  Surgeon: Beverly Gust, MD;  Location: [facility];  Service: ENT;  Laterality: Bilateral;  . FRONTAL SINUS EXPLORATION Bilateral 03/10/2016   Procedure: FRONTAL WITH TISSUE REMOVAL;  Surgeon: Beverly Gust, MD;  Location: [facility];  Service: ENT;  Laterality:  Bilateral;  . HERNIA REPAIR    . IMAGE GUIDED SINUS SURGERY Bilateral 03/10/2016   Procedure: IMAGE GUIDED SINUS SURGERY;  Surgeon: Beverly Gust, MD;  Location: [facility];  Service: ENT;  Laterality: Bilateral;  Need Disk GAVE DISK TO CECE 1-25 KP  . MAXILLARY ANTROSTOMY Bilateral 03/10/2016   Procedure: MAXILLARY ANTROSTOMY;  Surgeon: Beverly Gust, MD;  Location: [facility];  Service: ENT;  Laterality: Bilateral;  . NASAL TURBINATE REDUCTION Bilateral 03/10/2016   Procedure: TURBINATE REDUCTION/SUBMUCOSAL RESECTION;  Surgeon: Beverly Gust, MD;  Location: [facility];  Service: ENT;  Laterality: Bilateral;  . SEPTOPLASTY Bilateral 03/10/2016   Procedure: SEPTOPLASTY;  Surgeon: Beverly Gust, MD;  Location: [facility];  Service: ENT;  Laterality: Bilateral;  . SPHENOIDECTOMY Bilateral 03/10/2016   Procedure: SPHENOIDECTOMY WITH TISSUE REMOVAL;  Surgeon: Beverly Gust, MD;  Location: [facility];  Service: ENT;  Laterality: Bilateral;   Family History  Problem Relation Age of Onset  . Epilepsy Mother   . Osteoporosis Mother   . COPD Father   . Hypertension Father   . CVA Father   . Allergies Father   . Lung cancer Father   . Emphysema Father   . Cancer Sister        breast  . Breast cancer Sister 33  . CVA Brother   . COPD Brother   . Heart disease Brother   . Arthritis Brother   . CVA Brother   . Cancer Brother        cancer  . Healthy Brother   . Cancer Maternal Grandmother   . Hypertension Maternal Grandmother   . Healthy Brother   . Osteoporosis Sister   . COPD Sister    Social History   Socioeconomic History  . Marital status: Widowed    Spouse name: Not on file  . Number of children: 1  . Years of education: Not on file  . Highest education level: High school graduate  Occupational History  . Occupation: retired  Tobacco Use  . Smoking status: Never Smoker  . Smokeless tobacco: Never Used  Vaping Use  . Vaping  Use: Never used  Substance and Sexual Activity  . Alcohol use: No    Alcohol/week: 0.0 standard drinks  . Drug use: No  . Sexual activity: Not on file  Other Topics Concern  . Not on file  Social History Narrative  . Not on file   Social Determinants of Health   Financial Resource Strain: Low Risk   . Difficulty of Paying Living Expenses: Not hard at all  Food Insecurity: No Food Insecurity  . Worried About Charity fundraiser in the Last Year: Never true  . Ran Out of Food in the Last Year: Never true  Transportation Needs: No Transportation Needs  . Lack of Transportation (Medical): No  . Lack of Transportation (Non-Medical): No  Physical Activity: Inactive  . Days of Exercise per Week: 0 days  . Minutes of Exercise per Session: 0 min  Stress: No Stress Concern Present  . Feeling of Stress : Not at all  Social Connections: Moderately Integrated  . Frequency of Communication with Friends and Family: More than  three times a week  . Frequency of Social Gatherings with Friends and Family: More than three times a week  . Attends Religious Services: More than 4 times per year  . Active Member of Clubs or Organizations: Yes  . Attends Archivist Meetings: More than 4 times per year  . Marital Status: Widowed    Tobacco Counseling Counseling given: Not Answered   Clinical Intake:  Pre-visit preparation completed: Yes  Pain : No/denies pain     Nutritional Risks: None Diabetes: No  How often do you need to have someone help you when you read instructions, pamphlets, or other written materials from your doctor or pharmacy?: 1 - Never  Diabetic? No  Interpreter Needed?: No  Information entered by :: [facility], LPN   Activities of Daily Living In your present state of health, do you have any difficulty performing the following activities: 02/18/2020  Hearing? Y  Comment Wears bilateral hearing aids.  Vision? N  Difficulty concentrating or making  decisions? N  Walking or climbing stairs? N  Dressing or bathing? N  Doing errands, shopping? N  Preparing Food and eating ? N  Using the Toilet? N  In the past six months, have you accidently leaked urine? N  Do you have problems with loss of bowel control? N  Managing your Medications? N  Managing your Finances? N  Housekeeping or managing your Housekeeping? N  Some recent data might be hidden    Patient Care Team: Jerrol Banana., MD as PCP - General (Family Medicine) Beverly Gust, MD (Otolaryngology) Dingeldein, Remo Lipps, MD (Ophthalmology) Ubaldo Glassing Javier Docker, MD as Consulting Physician (Cardiology) Eugenie Birks Christian Mate., MD (Dentistry)  Indicate any recent Medical Services you may have received from other than [facility] providers in the past year (date may be approximate).     Assessment:   This is a routine wellness examination for Josceline.  Hearing/Vision screen No exam data present  Dietary issues and exercise activities discussed: Current Exercise Habits: The patient does not participate in regular exercise at present, Exercise limited by: None identified  Goals    . DIET - INCREASE WATER INTAKE     Recommend to drink at least 6-8 8oz glasses of water per day.      Depression Screen PHQ 2/9 Scores 02/18/2020 06/09/2019 02/17/2019 02/17/2019 02/07/2018 12/14/2016 11/04/2015  PHQ - 2 Score 0 1 0 0 0 0 0  PHQ- 9 Score - 3 - - - - -    Fall Risk Fall Risk  02/18/2020 02/17/2019 02/07/2018 12/25/2017 12/14/2016  Falls in the past year? 0 0 0 0 No  Comment - - - Emmi Telephone Survey: data to providers prior to load -  Number falls in past yr: 0 0 0 - -  Injury with Fall? 0 0 0 - -    FALL RISK PREVENTION PERTAINING TO THE HOME:  Any stairs in or around the home? Yes  If so, are there any without handrails? No  Home free of loose throw rugs in walkways, pet beds, electrical cords, etc? Yes  Adequate lighting in your home to reduce risk of falls? Yes   ASSISTIVE DEVICES  UTILIZED TO PREVENT FALLS:  Life alert? Yes  Use of a cane, walker or w/c? No  Grab bars in the bathroom? No  Shower chair or bench in shower? No  Elevated toilet seat or a handicapped toilet? Yes    Cognitive Function: MMSE - Mini Mental State Exam 06/25/2019 06/09/2019  Orientation to  time 5 4  Orientation to Place 5 5  Registration 3 3  Attention/ Calculation 5 5  Recall 3 3  Language- name 2 objects 2 2  Language- repeat 1 1  Language- follow 3 step command 3 3  Language- read & follow direction 1 1  Write a sentence 1 1  Copy design 1 1  Total score 30 29     6CIT Screen 02/07/2018  What Year? 0 points  What month? 0 points  What time? 0 points  Count back from 20 0 points  Months in reverse 0 points  Repeat phrase 0 points  Total Score 0    Immunizations Immunization History  Administered Date(s) Administered  . Fluad Quad(high Dose 65+) 10/22/2019  . Influenza Split 12/02/2005, 11/19/2010, 12/02/2011  . Influenza, High Dose Seasonal PF 10/28/2013, 11/04/2014, 11/04/2015, 11/22/2016, 11/21/2017, 10/30/2018  . Influenza,inj,Quad PF,6+ Mos 11/02/2012  . PFIZER SARS-COV-2 Vaccination 02/27/2019  . Pneumococcal Conjugate-13 08/04/2013  . Pneumococcal Polysaccharide-23 05/02/2009  . Td 09/10/2007    TDAP status: Due, Education has been provided regarding the importance of this vaccine. Advised may receive this vaccine at local pharmacy or Health Dept. Aware to provide a copy of the vaccination record if obtained from local pharmacy or Health Dept. Verbalized acceptance and understanding.  Flu Vaccine status: Up to date  Pneumococcal vaccine status: Up to date  Covid-19 vaccine status: Completed vaccines  Qualifies for Shingles Vaccine? Yes   Zostavax completed No   Shingrix Completed?: No.    Education has been provided regarding the importance of this vaccine. Patient has been advised to call insurance company to determine out of pocket expense if they have not  yet received this vaccine. Advised may also receive vaccine at local pharmacy or Health Dept. Verbalized acceptance and understanding.  Screening Tests Health Maintenance  Topic Date Due  . COVID-19 Vaccine (2 - Pfizer risk 4-dose series) 03/20/2019  . TETANUS/TDAP  02/17/2021 (Originally 09/09/2017)  . DEXA SCAN  04/03/2020  . INFLUENZA VACCINE  Completed  . PNA vac Low Risk Adult  Completed    Health Maintenance  Health Maintenance Due  Topic Date Due  . COVID-19 Vaccine (2 - Pfizer risk 4-dose series) 03/20/2019    Colorectal cancer screening: No longer required.   Mammogram status: No longer required due to age.  Bone Density status: Completed 04/03/18. Results reflect: Bone density results: OSTEOPOROSIS. Repeat every 2 years.  Lung Cancer Screening: (Low Dose CT Chest recommended if Age 87-80 years, 30 pack-year currently smoking OR have quit w/in 15years.) does not qualify.   Additional Screening:  Vision Screening: Recommended annual ophthalmology exams for early detection of glaucoma and other disorders of the eye. Is the patient up to date with their annual eye exam?  Yes  Who is the provider or what is the name of the office in which the patient attends annual eye exams? Dr Dingeldein @ [facility] If pt is not established with a provider, would they like to be referred to a provider to establish care? No .   Dental Screening: Recommended annual dental exams for proper oral hygiene  Community Resource Referral / Chronic Care Management: CRR required this visit?  No   CCM required this visit?  No      Plan:     I have personally reviewed and noted the following in the patient's chart:   . Medical and social history . Use of alcohol, tobacco or illicit drugs  . Current medications and supplements .  Functional ability and status . Nutritional status . Physical activity . Advanced directives . List of other physicians . Hospitalizations, surgeries, and ER visits in  previous 12 months . Vitals . Screenings to include cognitive, depression, and falls . Referrals and appointments  In addition, I have reviewed and discussed with patient certain preventive protocols, quality metrics, and best practice recommendations. A written personalized care plan for preventive services as well as general preventive health recommendations were provided to patient.     Damarcus Reggio [place], [place]   [id number]   Nurse Notes: Pt states she has completed the Covid vaccines. Requested she bring the vaccine card into next in office apt to update in chart.

## 2020-02-18 ENCOUNTER — Ambulatory Visit (INDEPENDENT_AMBULATORY_CARE_PROVIDER_SITE_OTHER): Payer: Medicare Other

## 2020-02-18 ENCOUNTER — Other Ambulatory Visit: Payer: Self-pay

## 2020-02-18 DIAGNOSIS — Z0000 Encounter for general adult medical examination without abnormal findings: Secondary | ICD-10-CM | POA: Diagnosis not present

## 2020-02-18 NOTE — Patient Instructions (Signed)
Kathy Pacheco , Thank you for taking time to come for your Medicare Wellness Visit. I appreciate your ongoing commitment to your health goals. Please review the following plan we discussed and let me know if I can assist you in the future.   Screening recommendations/referrals: Colonoscopy: No longer required.  Mammogram: No longer required.  Bone Density: Up to date, due 03/2020 Recommended yearly ophthalmology/optometry visit for glaucoma screening and checkup Recommended yearly dental visit for hygiene and checkup  Vaccinations: Influenza vaccine: done 10/22/19 Pneumococcal vaccine: Completed series Tdap vaccine: Currently due, declined receiving. Shingles vaccine: Shingrix discussed. Please contact your pharmacy for coverage information.     Advanced directives: Advance directive discussed with you today. Even though you declined this today please call our office should you change your mind and we can give you the proper paperwork for you to fill out.  Conditions/risks identified: Recommend to drink at least 6-8 8oz glasses of water per day.  Next appointment: 02/25/20 @ 10:40 AM with Dr Rosanna Randy    Preventive Care 109 Years and Older, Female Preventive care refers to lifestyle choices and visits with your health care provider that can promote health and wellness. What does preventive care include?  A yearly physical exam. This is also called an annual well check.  Dental exams once or twice a year.  Routine eye exams. Ask your health care provider how often you should have your eyes checked.  Personal lifestyle choices, including:  Daily care of your teeth and gums.  Regular physical activity.  Eating a healthy diet.  Avoiding tobacco and drug use.  Limiting alcohol use.  Practicing safe sex.  Taking low-dose aspirin every day.  Taking vitamin and mineral supplements as recommended by your health care provider. What happens during an annual well check? The services  and screenings done by your health care provider during your annual well check will depend on your age, overall health, lifestyle risk factors, and family history of disease. Counseling  Your health care provider may ask you questions about your:  Alcohol use.  Tobacco use.  Drug use.  Emotional well-being.  Home and relationship well-being.  Sexual activity.  Eating habits.  History of falls.  Memory and ability to understand (cognition).  Work and work Statistician.  Reproductive health. Screening  You may have the following tests or measurements:  Height, weight, and BMI.  Blood pressure.  Lipid and cholesterol levels. These may be checked every 5 years, or more frequently if you are over 39 years old.  Skin check.  Lung cancer screening. You may have this screening every year starting at age 99 if you have a 30-pack-year history of smoking and currently smoke or have quit within the past 15 years.  Fecal occult blood test (FOBT) of the stool. You may have this test every year starting at age 60.  Flexible sigmoidoscopy or colonoscopy. You may have a sigmoidoscopy every 5 years or a colonoscopy every 10 years starting at age 74.  Hepatitis C blood test.  Hepatitis B blood test.  Sexually transmitted disease (STD) testing.  Diabetes screening. This is done by checking your blood sugar (glucose) after you have not eaten for a while (fasting). You may have this done every 1-3 years.  Bone density scan. This is done to screen for osteoporosis. You may have this done starting at age 9.  Mammogram. This may be done every 1-2 years. Talk to your health care provider about how often you should have regular mammograms.  Talk with your health care provider about your test results, treatment options, and if necessary, the need for more tests. Vaccines  Your health care provider may recommend certain vaccines, such as:  Influenza vaccine. This is recommended every  year.  Tetanus, diphtheria, and acellular pertussis (Tdap, Td) vaccine. You may need a Td booster every 10 years.  Zoster vaccine. You may need this after age 42.  Pneumococcal 13-valent conjugate (PCV13) vaccine. One dose is recommended after age 87.  Pneumococcal polysaccharide (PPSV23) vaccine. One dose is recommended after age 55. Talk to your health care provider about which screenings and vaccines you need and how often you need them. This information is not intended to replace advice given to you by your health care provider. Make sure you discuss any questions you have with your health care provider. Document Released: 02/19/2015 Document Revised: 10/13/2015 Document Reviewed: 11/24/2014 Elsevier Interactive Patient Education  2017 [place] Prevention in the Home Falls can cause injuries. They can happen to people of all ages. There are many things you can do to make your home safe and to help prevent falls. What can I do on the outside of my home?  Regularly fix the edges of walkways and driveways and fix any cracks.  Remove anything that might make you trip as you walk through a door, such as a raised step or threshold.  Trim any bushes or trees on the path to your home.  Use bright outdoor lighting.  Clear any walking paths of anything that might make someone trip, such as rocks or tools.  Regularly check to see if handrails are loose or broken. Make sure that both sides of any steps have handrails.  Any raised decks and porches should have guardrails on the edges.  Have any leaves, snow, or ice cleared regularly.  Use sand or salt on walking paths during winter.  Clean up any spills in your garage right away. This includes oil or grease spills. What can I do in the bathroom?  Use night lights.  Install grab bars by the toilet and in the tub and shower. Do not use towel bars as grab bars.  Use non-skid mats or decals in the tub or shower.  If you  need to sit down in the shower, use a plastic, non-slip stool.  Keep the floor dry. Clean up any water that spills on the floor as soon as it happens.  Remove soap buildup in the tub or shower regularly.  Attach bath mats securely with double-sided non-slip rug tape.  Do not have throw rugs and other things on the floor that can make you trip. What can I do in the bedroom?  Use night lights.  Make sure that you have a light by your bed that is easy to reach.  Do not use any sheets or blankets that are too big for your bed. They should not hang down onto the floor.  Have a firm chair that has side arms. You can use this for support while you get dressed.  Do not have throw rugs and other things on the floor that can make you trip. What can I do in the kitchen?  Clean up any spills right away.  Avoid walking on wet floors.  Keep items that you use a lot in easy-to-reach places.  If you need to reach something above you, use a strong step stool that has a grab bar.  Keep electrical cords out of the way.  Do not use floor polish or wax that makes floors slippery. If you must use wax, use non-skid floor wax.  Do not have throw rugs and other things on the floor that can make you trip. What can I do with my stairs?  Do not leave any items on the stairs.  Make sure that there are handrails on both sides of the stairs and use them. Fix handrails that are broken or loose. Make sure that handrails are as long as the stairways.  Check any carpeting to make sure that it is firmly attached to the stairs. Fix any carpet that is loose or worn.  Avoid having throw rugs at the top or bottom of the stairs. If you do have throw rugs, attach them to the floor with carpet tape.  Make sure that you have a light switch at the top of the stairs and the bottom of the stairs. If you do not have them, ask someone to add them for you. What else can I do to help prevent falls?  Wear shoes  that:  Do not have high heels.  Have rubber bottoms.  Are comfortable and fit you well.  Are closed at the toe. Do not wear sandals.  If you use a stepladder:  Make sure that it is fully opened. Do not climb a closed stepladder.  Make sure that both sides of the stepladder are locked into place.  Ask someone to hold it for you, if possible.  Clearly mark and make sure that you can see:  Any grab bars or handrails.  First and last steps.  Where the edge of each step is.  Use tools that help you move around (mobility aids) if they are needed. These include:  Canes.  Walkers.  Scooters.  Crutches.  Turn on the lights when you go into a dark area. Replace any light bulbs as soon as they burn out.  Set up your furniture so you have a clear path. Avoid moving your furniture around.  If any of your floors are uneven, fix them.  If there are any pets around you, be aware of where they are.  Review your medicines with your doctor. Some medicines can make you feel dizzy. This can increase your chance of falling. Ask your doctor what other things that you can do to help prevent falls. This information is not intended to replace advice given to you by your health care provider. Make sure you discuss any questions you have with your health care provider. Document Released: 11/19/2008 Document Revised: 07/01/2015 Document Reviewed: 02/27/2014 Elsevier Interactive Patient Education  2017 [employer].

## 2020-02-25 ENCOUNTER — Other Ambulatory Visit: Payer: Self-pay

## 2020-02-25 ENCOUNTER — Encounter: Payer: Self-pay | Admitting: Family Medicine

## 2020-02-25 ENCOUNTER — Ambulatory Visit (INDEPENDENT_AMBULATORY_CARE_PROVIDER_SITE_OTHER): Payer: Medicare Other | Admitting: Family Medicine

## 2020-02-25 VITALS — BP 136/52 | HR 85 | Temp 97.5°F | Resp 18 | Ht 60.0 in | Wt 137.0 lb

## 2020-02-25 DIAGNOSIS — E7800 Pure hypercholesterolemia, unspecified: Secondary | ICD-10-CM | POA: Diagnosis not present

## 2020-02-25 DIAGNOSIS — F3341 Major depressive disorder, recurrent, in partial remission: Secondary | ICD-10-CM | POA: Diagnosis not present

## 2020-02-25 DIAGNOSIS — I10 Essential (primary) hypertension: Secondary | ICD-10-CM

## 2020-02-25 DIAGNOSIS — K219 Gastro-esophageal reflux disease without esophagitis: Secondary | ICD-10-CM

## 2020-02-25 NOTE — Progress Notes (Signed)
I,[name],acting as a scribe for [name], MD.,have documented all relevant documentation on the behalf of [name], MD,as directed by  [name], MD while in the presence of [name], MD.   Established patient visit   Patient: Kathy Pacheco   DOB: 07-04-1931   87 y.o. Female  MRN: [id number] Visit Date: 02/25/2020  Today's healthcare provider: [name], MD   Chief Complaint  Patient presents with  . Follow-up   Subjective    HPI  Weight loss From 10/22/2019-Weight is now stable and patient asymptomatic.  Will follow clinically.  No further weight work-up indicated at this time. Overall patient feels well.     Medications: Outpatient Medications Prior to Visit  Medication Sig  . acetaminophen (TYLENOL) 500 MG tablet Take 500 mg by mouth every 6 (six) hours as needed for mild pain.   Marland Kitchen albuterol (VENTOLIN HFA) 108 (90 Base) MCG/ACT inhaler Inhale 2 puffs into the lungs every 6 (six) hours as needed for wheezing or shortness of breath.  Marland Kitchen amLODipine (NORVASC) 5 MG tablet TAKE 1 TABLET (5 MG TOTAL) BY MOUTH DAILY.  . calcium carbonate (OS-CAL) 600 MG TABS tablet Take 600 mg by mouth. Takes occasionally  . cetirizine (ZYRTEC ALLERGY) 10 MG tablet Take 1 tablet (10 mg total) by mouth daily.  . citalopram (CELEXA) 10 MG tablet TAKE 1 TABLET (10 MG TOTAL) BY MOUTH DAILY.  Marland Kitchen FLOVENT HFA 110 MCG/ACT inhaler Inhale 2 puffs into the lungs 2 (two) times daily.  . fluticasone (FLONASE) 50 MCG/ACT nasal spray 2 SPRAYS EACH NOSTRIL TWICE DAILY  . ondansetron (ZOFRAN ODT) 4 MG disintegrating tablet Allow 1-2 tablets to dissolve in your mouth every 8 hours as needed for nausea/vomiting  . pantoprazole (PROTONIX) 40 MG tablet TAKE 1 TABLET BY MOUTH TWICE A DAY 30 MIN BEFORE BREAKFAST AND 30 MIN BEFORE DINNER  . sucralfate (CARAFATE) 1 g tablet Take 1 tablet (1 g total) by mouth 4 (four) times daily as needed (for abdominal discomfort,  nausea, and/or vomiting).  Marland Kitchen amoxicillin (AMOXIL) 500 MG capsule Take 1 capsule (500 mg total) by mouth 3 (three) times daily. (Patient not taking: Reported on 02/25/2020)   No facility-administered medications prior to visit.    Review of Systems  Constitutional: Negative for appetite change, chills, fatigue and fever.  Respiratory: Negative for chest tightness and shortness of breath.   Cardiovascular: Negative for chest pain and palpitations.  Gastrointestinal: Negative for abdominal pain, nausea and vomiting.  Neurological: Negative for dizziness and weakness.       Objective    There were no vitals taken for this visit.    Physical Exam Vitals reviewed.  Constitutional:      Appearance: Normal appearance.  HENT:     Head: Normocephalic and atraumatic.     Right Ear: External ear normal.     Left Ear: External ear normal.  Eyes:     General: No scleral icterus.    Conjunctiva/sclera: Conjunctivae normal.  Cardiovascular:     Rate and Rhythm: Normal rate and regular rhythm.     Pulses: Normal pulses.     Heart sounds: Normal heart sounds.  Pulmonary:     Effort: Pulmonary effort is normal.     Breath sounds: Normal breath sounds.  Musculoskeletal:     Right lower leg: No edema.     Left lower leg: No edema.  Skin:    General: Skin is warm and dry.  Neurological:     General: No focal deficit present.     Mental Status: She is alert and oriented to person, place, and time.  Psychiatric:        Mood and Affect: Mood normal.        Behavior: Behavior normal.        Thought Content: Thought content normal.        Judgment: Judgment normal.       No results found for any visits on 02/25/20.  Assessment & Plan     1. Primary hypertension Controlled.  2. Gastroesophageal reflux disease without esophagitis Pantoprazole 40 mg every morning as needed.  3. Recurrent major depressive disorder, in partial remission (HCC) Stable.  Patient needs COVID  booster.  4. Hypercholesteremia    No follow-ups on file.         Alisa Stjames Cranford Mon, MD  [facility] [phone] (phone) [phone] (fax)  [place]

## 2020-03-10 ENCOUNTER — Ambulatory Visit (INDEPENDENT_AMBULATORY_CARE_PROVIDER_SITE_OTHER): Payer: Medicare Other

## 2020-03-10 ENCOUNTER — Other Ambulatory Visit: Payer: Self-pay

## 2020-03-10 DIAGNOSIS — Z23 Encounter for immunization: Secondary | ICD-10-CM

## 2020-03-21 IMAGING — CR DG CHEST 2V
1 series · 2 of 2 positions shown · non-contrast
Comparison: 01/07/2017

CLINICAL DATA: Pt states she has had a cough for 3 years but has
gotten worse in the last 2-3 weeks with SOB. History of pneumonia,
HTN. shielded

EXAM:
CHEST - 2 VIEW

[Series 1: dg chest 2 view · 0.14mm/px · 2 of 2 slices shown]
[im 1/2]
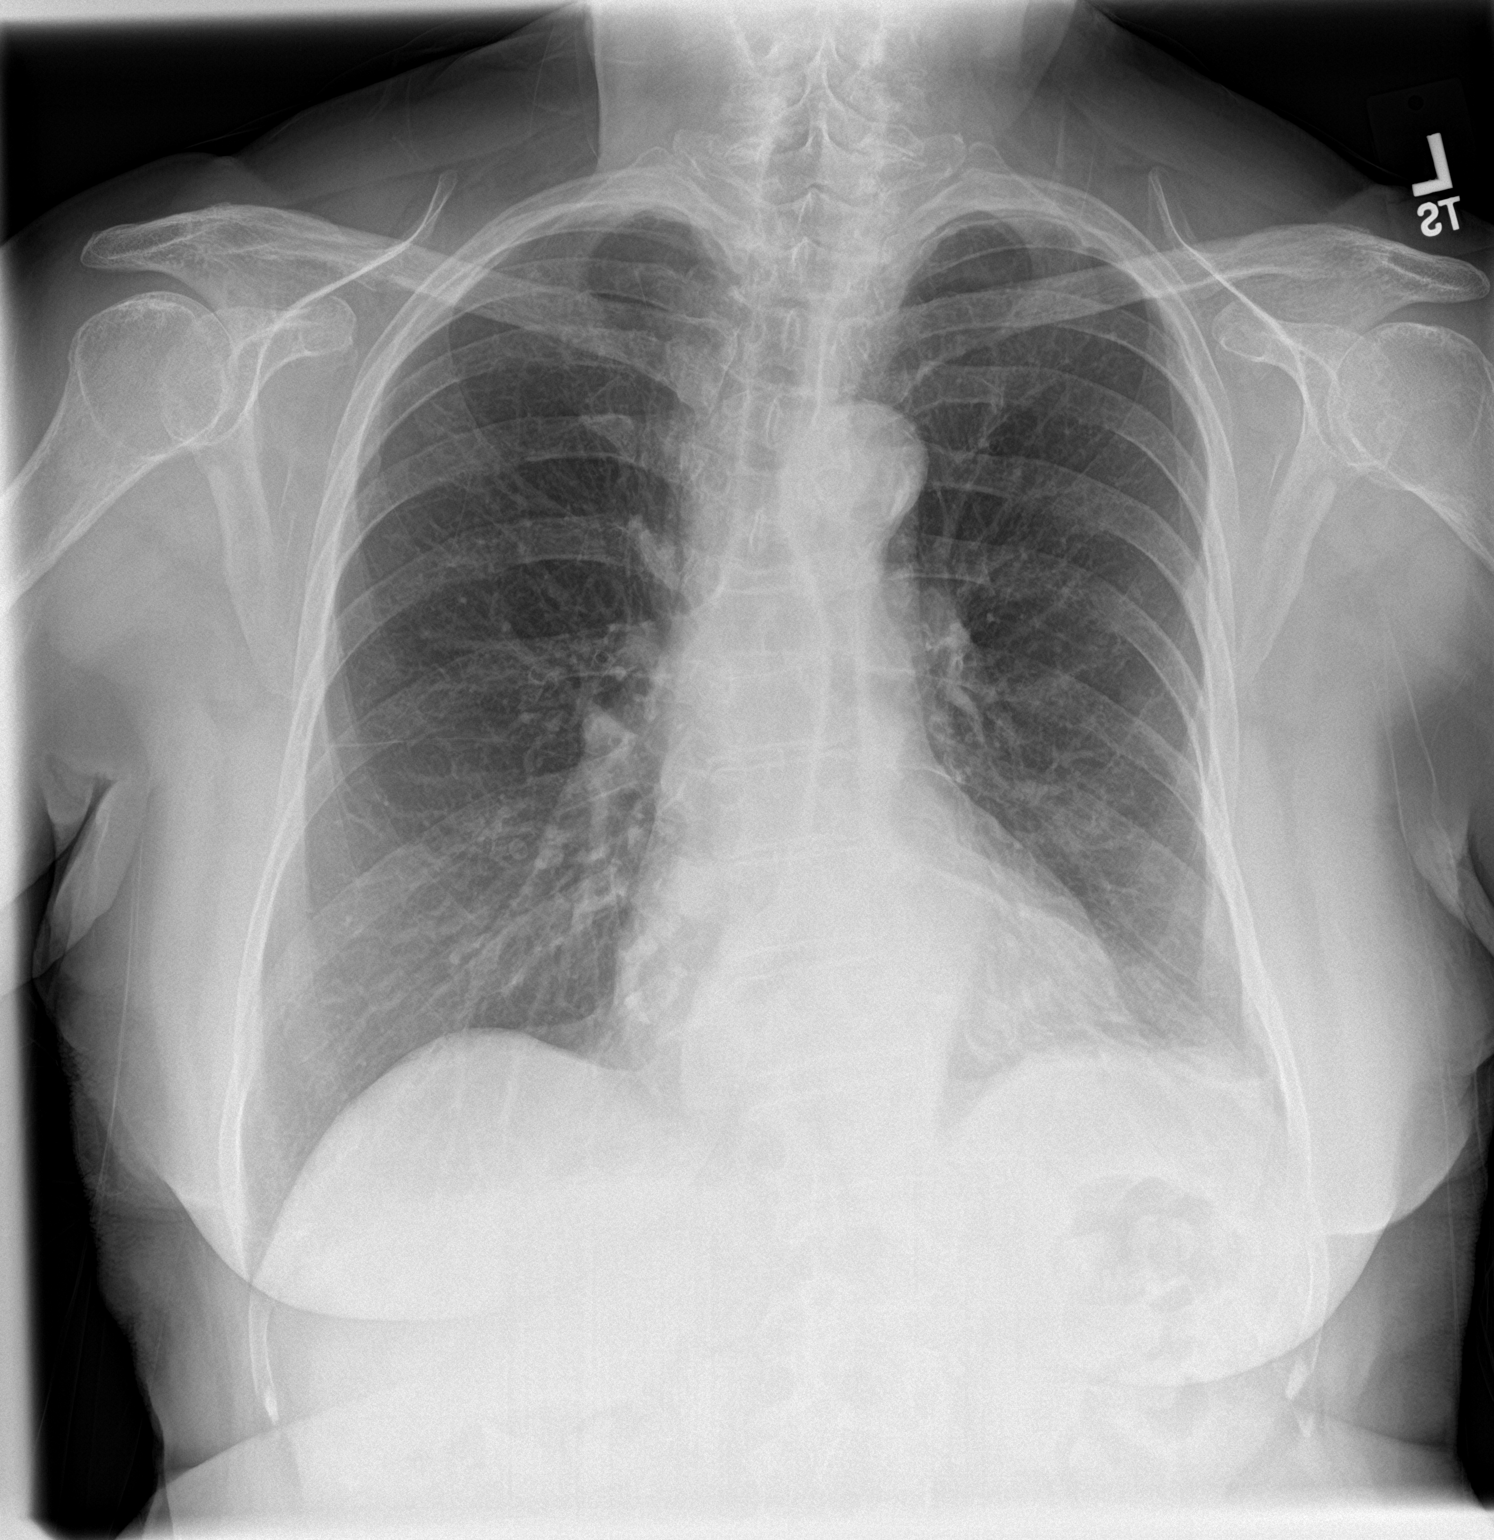
[im 2/2]
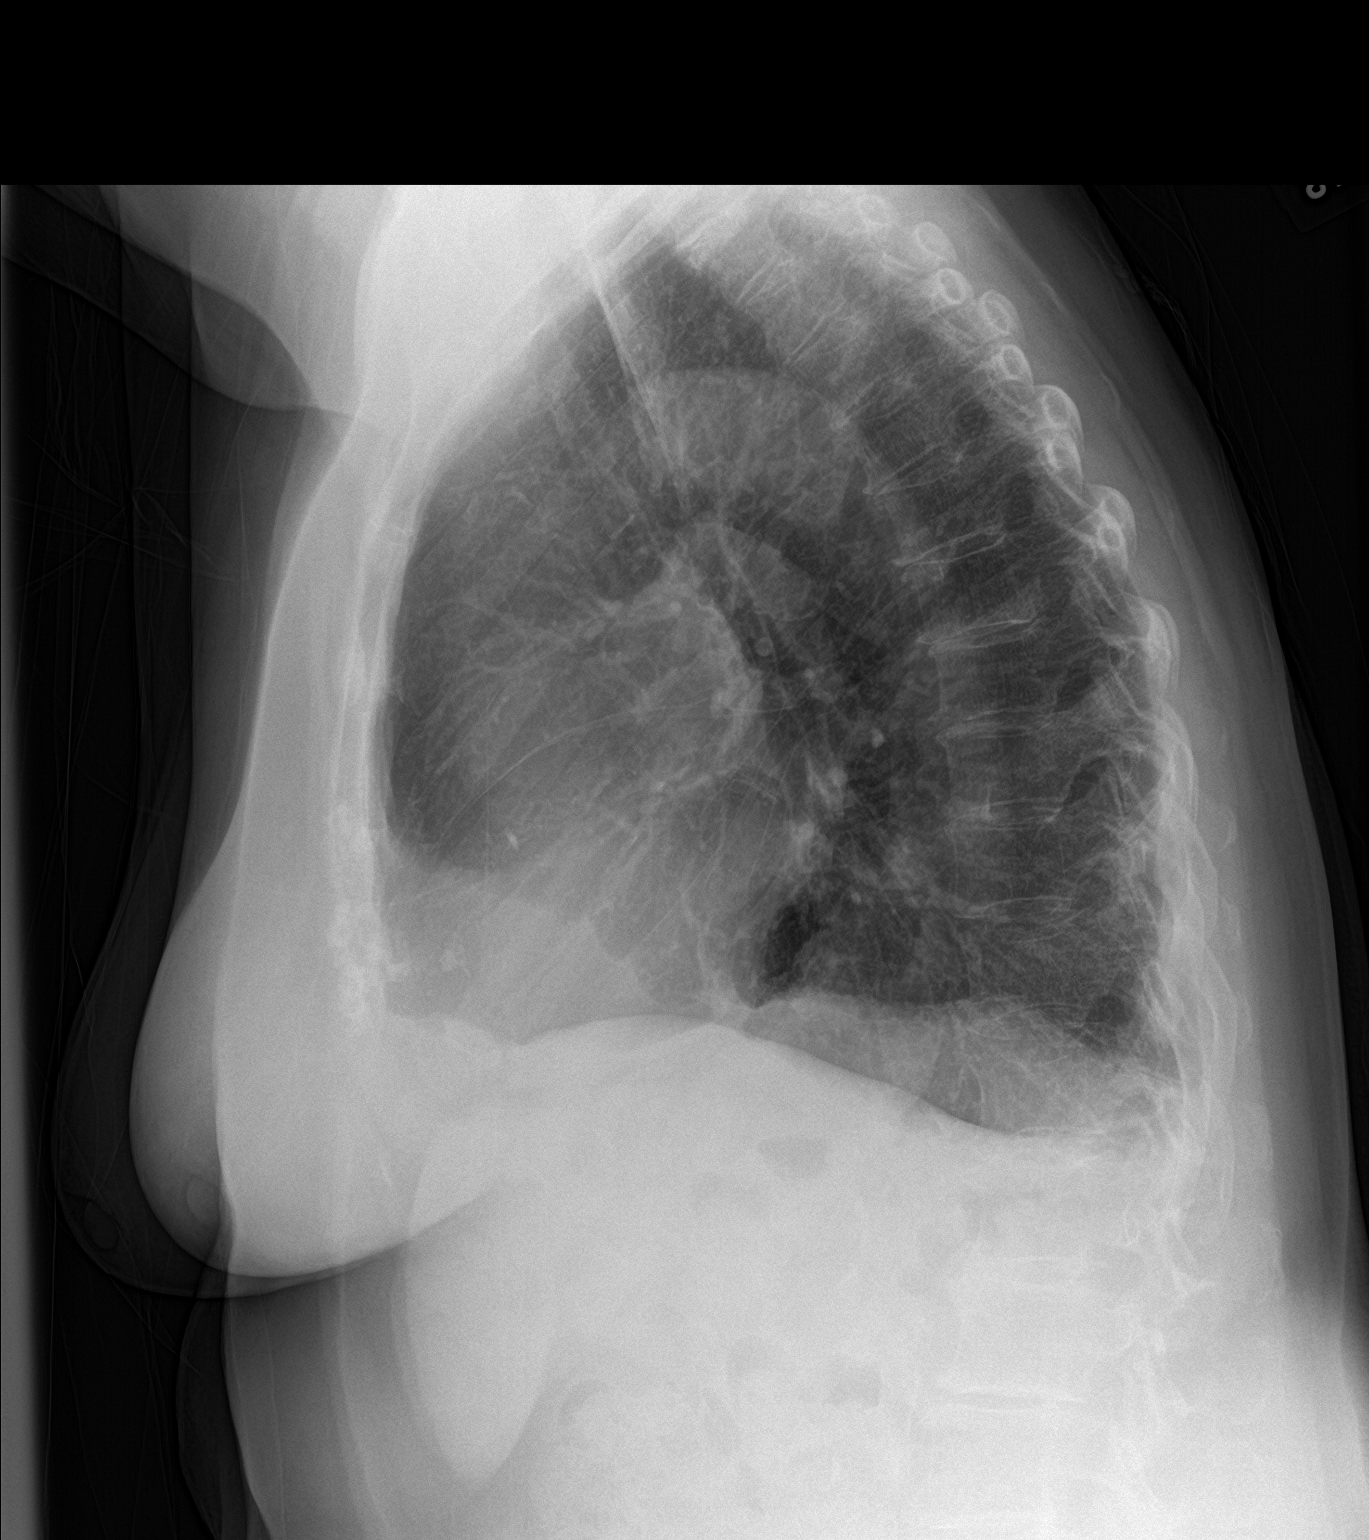

[2 of 2 positions shown; findings below may reference images not displayed]

FINDINGS: Normal mediastinum and cardiac silhouette. Normal pulmonary
vasculature. No evidence of effusion, infiltrate, or pneumothorax.
Mild LEFT basilar atelectasis. No acute bony abnormality.
IMPRESSION: No acute cardiopulmonary process.

## 2020-05-07 ENCOUNTER — Other Ambulatory Visit: Payer: Self-pay | Admitting: Family Medicine

## 2020-05-31 ENCOUNTER — Telehealth (INDEPENDENT_AMBULATORY_CARE_PROVIDER_SITE_OTHER): Payer: Medicare Other

## 2020-05-31 ENCOUNTER — Ambulatory Visit: Payer: Medicare Other | Admitting: Family Medicine

## 2020-05-31 DIAGNOSIS — N3000 Acute cystitis without hematuria: Secondary | ICD-10-CM

## 2020-05-31 NOTE — Telephone Encounter (Signed)
Caller following up on below messages, caller wanted to know if the pt could drop off a urine sample.

## 2020-05-31 NOTE — Telephone Encounter (Signed)
Please review. Thanks!  

## 2020-05-31 NOTE — Telephone Encounter (Signed)
Copied from [place] [phone]. Topic: Appointment Scheduling - Scheduling Inquiry for Clinic >> May 31, 2020 12:40 PM Erick Blinks wrote: Reason for CRM: Pt's daughter called reporting that the patient has a potential UTI, she wants office to contact her so she can get a urinalysis for patient. Next available is not until mid may, which is too far away please advise.  Best contact: [phone] Darnelle Catalan.

## 2020-05-31 NOTE — Telephone Encounter (Signed)
I can see her in the morning at 9 AM.

## 2020-05-31 NOTE — Progress Notes (Deleted)
      Established patient visit   Patient: Kathy Pacheco   DOB: 1931/03/19   87 y.o. Female  MRN: [id number] Visit Date: 05/31/2020  Today's healthcare provider: Lelon Huh, MD   No chief complaint on file.  Subjective    HPI  ***  {Show patient history (optional):23778::" "}   Medications: Outpatient Medications Prior to Visit  Medication Sig  . acetaminophen (TYLENOL) 500 MG tablet Take 500 mg by mouth every 6 (six) hours as needed for mild pain.   Marland Kitchen albuterol (VENTOLIN HFA) 108 (90 Base) MCG/ACT inhaler Inhale 2 puffs into the lungs every 6 (six) hours as needed for wheezing or shortness of breath.  Marland Kitchen amLODipine (NORVASC) 5 MG tablet TAKE 1 TABLET (5 MG TOTAL) BY MOUTH DAILY.  Marland Kitchen amoxicillin (AMOXIL) 500 MG capsule Take 1 capsule (500 mg total) by mouth 3 (three) times daily. (Patient not taking: Reported on 02/25/2020)  . calcium carbonate (OS-CAL) 600 MG TABS tablet Take 600 mg by mouth. Takes occasionally  . cetirizine (ZYRTEC ALLERGY) 10 MG tablet Take 1 tablet (10 mg total) by mouth daily.  . citalopram (CELEXA) 10 MG tablet TAKE 1 TABLET (10 MG TOTAL) BY MOUTH DAILY.  Marland Kitchen FLOVENT HFA 110 MCG/ACT inhaler Inhale 2 puffs into the lungs 2 (two) times daily.  . fluticasone (FLONASE) 50 MCG/ACT nasal spray 2 SPRAYS EACH NOSTRIL TWICE DAILY  . ondansetron (ZOFRAN ODT) 4 MG disintegrating tablet Allow 1-2 tablets to dissolve in your mouth every 8 hours as needed for nausea/vomiting  . pantoprazole (PROTONIX) 40 MG tablet TAKE 1 TABLET BY MOUTH TWICE A DAY 30 MIN BEFORE BREAKFAST AND 30 MIN BEFORE DINNER  . sucralfate (CARAFATE) 1 g tablet Take 1 tablet (1 g total) by mouth 4 (four) times daily as needed (for abdominal discomfort, nausea, and/or vomiting).   No facility-administered medications prior to visit.    Review of Systems  {Labs  Heme  Chem  Endocrine  Serology  Results Review (optional):23779::" "}   Objective    There were no vitals taken for this  visit. {Show previous vital signs (optional):23777::" "}   Physical Exam  ***  No results found for any visits on 05/31/20.  Assessment & Plan     ***  No follow-ups on file.      {provider attestation***:1}   Lelon Huh, MD  [facility] [phone] (phone) [phone] (fax)  [place]

## 2020-06-01 ENCOUNTER — Observation Stay: Payer: Medicare Other

## 2020-06-01 ENCOUNTER — Emergency Department: Payer: Medicare Other

## 2020-06-01 ENCOUNTER — Other Ambulatory Visit: Payer: Self-pay

## 2020-06-01 ENCOUNTER — Observation Stay
Admission: EM | Admit: 2020-06-01 | Discharge: 2020-06-02 | Disposition: A | Discharge status: Home / Self Care | Payer: Medicare Other | Attending: Emergency Medicine | Admitting: Emergency Medicine

## 2020-06-01 DIAGNOSIS — N3000 Acute cystitis without hematuria: Secondary | ICD-10-CM | POA: Insufficient documentation

## 2020-06-01 DIAGNOSIS — R4182 Altered mental status, unspecified: Secondary | ICD-10-CM | POA: Diagnosis not present

## 2020-06-01 DIAGNOSIS — K449 Diaphragmatic hernia without obstruction or gangrene: Secondary | ICD-10-CM | POA: Diagnosis not present

## 2020-06-01 DIAGNOSIS — Z20822 Contact with and (suspected) exposure to covid-19: Secondary | ICD-10-CM | POA: Diagnosis not present

## 2020-06-01 DIAGNOSIS — F419 Anxiety disorder, unspecified: Secondary | ICD-10-CM | POA: Diagnosis present

## 2020-06-01 DIAGNOSIS — G4730 Sleep apnea, unspecified: Secondary | ICD-10-CM | POA: Diagnosis present

## 2020-06-01 DIAGNOSIS — R55 Syncope and collapse: Principal | ICD-10-CM | POA: Insufficient documentation

## 2020-06-01 DIAGNOSIS — R110 Nausea: Secondary | ICD-10-CM | POA: Diagnosis not present

## 2020-06-01 DIAGNOSIS — K219 Gastro-esophageal reflux disease without esophagitis: Secondary | ICD-10-CM | POA: Diagnosis not present

## 2020-06-01 DIAGNOSIS — R0902 Hypoxemia: Secondary | ICD-10-CM | POA: Diagnosis not present

## 2020-06-01 DIAGNOSIS — Z79899 Other long term (current) drug therapy: Secondary | ICD-10-CM | POA: Insufficient documentation

## 2020-06-01 DIAGNOSIS — J449 Chronic obstructive pulmonary disease, unspecified: Secondary | ICD-10-CM | POA: Diagnosis not present

## 2020-06-01 DIAGNOSIS — R531 Weakness: Secondary | ICD-10-CM | POA: Diagnosis not present

## 2020-06-01 DIAGNOSIS — J302 Other seasonal allergic rhinitis: Secondary | ICD-10-CM | POA: Diagnosis present

## 2020-06-01 DIAGNOSIS — N390 Urinary tract infection, site not specified: Secondary | ICD-10-CM | POA: Diagnosis present

## 2020-06-01 DIAGNOSIS — E785 Hyperlipidemia, unspecified: Secondary | ICD-10-CM | POA: Diagnosis present

## 2020-06-01 DIAGNOSIS — G319 Degenerative disease of nervous system, unspecified: Secondary | ICD-10-CM | POA: Diagnosis not present

## 2020-06-01 LAB — POCT URINALYSIS DIPSTICK
Bilirubin, UA: NEGATIVE
Glucose, UA: NEGATIVE
Nitrite, UA: POSITIVE
Protein, UA: POSITIVE — AB
Spec Grav, UA: 1.02 (ref 1.010–1.025)
Urobilinogen, UA: 1 E.U./dL
pH, UA: 6 (ref 5.0–8.0)

## 2020-06-01 LAB — CBC WITH DIFFERENTIAL/PLATELET
Abs Immature Granulocytes: 0.07 10*3/uL (ref 0.00–0.07)
Basophils Absolute: 0.1 10*3/uL (ref 0.0–0.1)
Basophils Relative: 1 %
Eosinophils Absolute: 0.3 10*3/uL (ref 0.0–0.5)
Eosinophils Relative: 3 %
HCT: 34.2 % — ABNORMAL LOW (ref 36.0–46.0)
Hemoglobin: 11.1 g/dL — ABNORMAL LOW (ref 12.0–15.0)
Immature Granulocytes: 1 %
Lymphocytes Relative: 13 %
Lymphs Abs: 1.3 10*3/uL (ref 0.7–4.0)
MCH: 26.9 pg (ref 26.0–34.0)
MCHC: 32.5 g/dL (ref 30.0–36.0)
MCV: 83 fL (ref 80.0–100.0)
Monocytes Absolute: 0.7 10*3/uL (ref 0.1–1.0)
Monocytes Relative: 6 %
Neutro Abs: 7.9 10*3/uL — ABNORMAL HIGH (ref 1.7–7.7)
Neutrophils Relative %: 76 %
Platelets: 285 10*3/uL (ref 150–400)
RBC: 4.12 MIL/uL (ref 3.87–5.11)
RDW: 16.3 % — ABNORMAL HIGH (ref 11.5–15.5)
WBC: 10.4 10*3/uL (ref 4.0–10.5)
nRBC: 0 % (ref 0.0–0.2)

## 2020-06-01 LAB — BASIC METABOLIC PANEL
Anion gap: 11 (ref 5–15)
BUN: 23 mg/dL (ref 8–23)
CO2: 25 mmol/L (ref 22–32)
Calcium: 9.2 mg/dL (ref 8.9–10.3)
Chloride: 102 mmol/L (ref 98–111)
Creatinine, Ser: 1.48 mg/dL — ABNORMAL HIGH (ref 0.44–1.00)
GFR, Estimated: 34 mL/min — ABNORMAL LOW (ref >60)
Glucose, Bld: 195 mg/dL — ABNORMAL HIGH (ref 70–99)
Potassium: 3.9 mmol/L (ref 3.5–5.1)
Sodium: 138 mmol/L (ref 135–145)

## 2020-06-01 LAB — RESP PANEL BY RT-PCR (FLU A&B, COVID) ARPGX2
Influenza A by PCR: NEGATIVE
Influenza B by PCR: NEGATIVE
SARS Coronavirus 2 by RT PCR: NEGATIVE

## 2020-06-01 LAB — TSH: TSH: 2.031 u[IU]/mL (ref 0.350–4.500)

## 2020-06-01 LAB — TROPONIN I (HIGH SENSITIVITY)
Troponin I (High Sensitivity): 4 ng/L (ref <18)
Troponin I (High Sensitivity): 4 ng/L (ref <18)

## 2020-06-01 MED ORDER — FAMOTIDINE IN NACL 20-0.9 MG/50ML-% IV SOLN
20.0000 mg | Freq: Once | INTRAVENOUS | Status: AC
  Administered 2020-06-01: 20 mg via INTRAVENOUS
  Filled 2020-06-01: qty 50

## 2020-06-01 MED ORDER — FLUTICASONE PROPIONATE 50 MCG/ACT NA SUSP: 2.0000 | Freq: Every day | NASAL | Status: DC

## 2020-06-01 MED ORDER — LORAZEPAM 2 MG/ML IJ SOLN
1.0000 mg | Freq: Once | INTRAMUSCULAR | Status: AC | PRN
  Administered 2020-06-01: 1 mg via INTRAVENOUS
  Filled 2020-06-01: qty 1

## 2020-06-01 MED ORDER — ONDANSETRON HCL 4 MG/2ML IJ SOLN
4.0000 mg | Freq: Four times a day (QID) | INTRAMUSCULAR | Status: DC | PRN
  Administered 2020-06-01: 4 mg via INTRAVENOUS
  Filled 2020-06-01: qty 2

## 2020-06-01 MED ORDER — ALBUTEROL SULFATE HFA 108 (90 BASE) MCG/ACT IN AERS
2.0000 | INHALATION_SPRAY | Freq: Four times a day (QID) | RESPIRATORY_TRACT | Status: DC | PRN
  Filled 2020-06-01: qty 6.7

## 2020-06-01 MED ORDER — ONDANSETRON 4 MG PO TBDP
4.0000 mg | ORAL_TABLET | Freq: Once | ORAL | Status: AC
  Administered 2020-06-01: 4 mg via ORAL
  Filled 2020-06-01: qty 1

## 2020-06-01 MED ORDER — ACETAMINOPHEN 650 MG RE SUPP: 650.0000 mg | Freq: Four times a day (QID) | RECTAL | Status: DC | PRN

## 2020-06-01 MED ORDER — SODIUM CHLORIDE 0.9 % IV BOLUS
1000.0000 mL | Freq: Once | INTRAVENOUS | Status: AC
  Administered 2020-06-01: 1000 mL via INTRAVENOUS

## 2020-06-01 MED ORDER — LORATADINE 10 MG PO TABS
10.0000 mg | ORAL_TABLET | Freq: Every day | ORAL | Status: DC
  Administered 2020-06-02: 10 mg via ORAL
  Filled 2020-06-01: qty 1

## 2020-06-01 MED ORDER — HYDRALAZINE HCL 25 MG PO TABS: 25.0000 mg | ORAL_TABLET | Freq: Three times a day (TID) | ORAL | Status: DC | PRN

## 2020-06-01 MED ORDER — PANTOPRAZOLE SODIUM 40 MG PO TBEC
40.0000 mg | DELAYED_RELEASE_TABLET | Freq: Two times a day (BID) | ORAL | Status: DC
  Administered 2020-06-02: 40 mg via ORAL
  Filled 2020-06-01: qty 1

## 2020-06-01 MED ORDER — ONDANSETRON HCL 4 MG PO TABS: 4.0000 mg | ORAL_TABLET | Freq: Four times a day (QID) | ORAL | Status: DC | PRN

## 2020-06-01 MED ORDER — FLUTICASONE PROPIONATE 50 MCG/ACT NA SUSP
1.0000 | Freq: Every day | NASAL | Status: DC | PRN
  Filled 2020-06-01: qty 16

## 2020-06-01 MED ORDER — SODIUM CHLORIDE 0.9% FLUSH
3.0000 mL | Freq: Two times a day (BID) | INTRAVENOUS | Status: DC
  Administered 2020-06-01 – 2020-06-02 (×2): 3 mL via INTRAVENOUS

## 2020-06-01 MED ORDER — ACETAMINOPHEN 325 MG PO TABS: 650.0000 mg | ORAL_TABLET | Freq: Four times a day (QID) | ORAL | Status: DC | PRN

## 2020-06-01 MED ORDER — FAMOTIDINE 20 MG PO TABS
40.0000 mg | ORAL_TABLET | Freq: Two times a day (BID) | ORAL | Status: DC
  Administered 2020-06-02: 40 mg via ORAL
  Filled 2020-06-01: qty 2

## 2020-06-01 MED ORDER — AMLODIPINE BESYLATE 5 MG PO TABS
5.0000 mg | ORAL_TABLET | Freq: Every day | ORAL | Status: DC
  Administered 2020-06-02: 5 mg via ORAL
  Filled 2020-06-01: qty 1

## 2020-06-01 MED ORDER — CITALOPRAM HYDROBROMIDE 20 MG PO TABS: 10.0000 mg | ORAL_TABLET | Freq: Every day | ORAL | Status: DC

## 2020-06-01 MED ORDER — ENOXAPARIN SODIUM 30 MG/0.3ML ~~LOC~~ SOLN
30.0000 mg | SUBCUTANEOUS | Status: DC
  Administered 2020-06-01: 30 mg via SUBCUTANEOUS
  Filled 2020-06-01 (×2): qty 0.3

## 2020-06-01 MED ORDER — SODIUM CHLORIDE 0.9 % IV SOLN
1.0000 g | INTRAVENOUS | Status: DC
  Filled 2020-06-01: qty 10

## 2020-06-01 MED ORDER — SODIUM CHLORIDE 0.9 % IV SOLN
1.0000 g | Freq: Once | INTRAVENOUS | Status: AC
  Administered 2020-06-01: 1 g via INTRAVENOUS
  Filled 2020-06-01: qty 10

## 2020-06-01 NOTE — H&P (Signed)
History and Physical   Kathy Pacheco [id number] DOB: [birth year] DOA: 06/01/2020  PCP: Jerrol Banana., MD  Patient coming from: home via EMS  I have personally briefly reviewed patient's old medical records in [place].  Chief Concern: syncope  HPI: Kathy Pacheco is a 87 y.o. female with medical history significant for hypertension, seasonal allergies, GERD, depression, presents to the emergency department for chief concerns of a syncopal event.  Daughter at bedside states that they had just had lunch at [employer].  Patient had black eyed peas, mashed potatoes, vegetables for lunch.    While in the car on their way home, patient endorsed to her daughter that patient had had an episode of burning in her chest. Then daughter states that the patient endorsed feeling nauseous. Then patient had a gurgling episode and passed out. Daughter states patient was out for 10 secs. Daugther denies abnormal movements or (seizure like activity), urinary or bowel incontinence.  Patient states that she is unaware that she passed out.  Daughter pulled over to the side of a country road and called EMS. After EMS came, patient vomitted twice and it was yellow vomitus and yellow squash.   She endorses urgency and frequency in the last two days.  Social history: she lives by herself. She denies tobacco use, etoh, recreational drug use. Her husband smoked in the house for 40 years before passing away of lung cancer. She used to work in a Special educational needs teacher.  Vaccination: she is fully vaccinated for covid 19, including booster  ROS: Constitutional: no weight change, no fever ENT/Mouth: no sore throat, no rhinorrhea Eyes: no eye pain, no vision changes Cardiovascular: no chest pain, no dyspnea,  no edema, no palpitations Respiratory: no cough, no sputum, no wheezing Gastrointestinal: no nausea, no vomiting, no diarrhea, no constipation Genitourinary: no urinary incontinence, no  dysuria, no hematuria Musculoskeletal: no arthralgias, no myalgias Skin: no skin lesions, no pruritus, Neuro: + weakness, no loss of consciousness, no syncope Psych: no anxiety, no depression, no decrease appetite Heme/Lymph: no bruising, no bleeding  ED Course: Discussed with emergency medicine provider, patient requiring hospitalization due to a syncopal episode.  Vitals in the emergency department was remarkable for temperature of 97.7, respiration rate of 14, heart rate 63, blood pressure 143/86, SPO2 of 100% on room air.  Labs in the emergency department was remarkable for sodium 138, potassium 3.9, chloride 102, bicarb 25, BUN 23, serum creatinine 1.48, nonfasting blood glucose 195, WBC 10.4, hemoglobin 11.1, platelets 285.  Patient is status post ondansetron 4 mg p.o., 1 dose of ceftriaxone 1 g IV, normal saline 1 L bolus per EDP.  Her COVID test was negative in the emergency department.  UA was positive for leukocytes and protein.  Assessment/Plan  Principal Problem:   Syncope Active Problems:   Anxiety   COPD, mild (HCC)   Acute cystitis   Acid reflux   HLD (hyperlipidemia)   Allergic rhinitis, seasonal   Apnea, sleep   UTI (urinary tract infection)   Syncopal episode-first episode, etiology work-up in progress differentials include stroke versus cardiac etiology - MRI to assess for stroke -Complete echo ordered - Doubt ACS as troponin was negative x2, troponin has remained at 4 since 10 months ago - Admit to MedSurg, observation, with telemetry - No indications for orthostatic vitals as patient - Fall precautions  Hypertension-resumed home amlodipine 5 mg daily for 4/27 - Hydralazine 25 mg p.o. every 8 hours as needed for SBP  greater than 180  Urinary tract infection- Ceftriaxone 1 g IV, every 24 hours, 2 more doses - Status post 1 dose of ceftriaxone per EDP  GERD- pantoprazole 40 mg, p.o., twice daily 30 minutes before meals per daughter - Famotidine 40 mg  p.o. twice daily, before meals per daughter  As needed medications: Ondansetron, acetaminophen Chart reviewed.   DVT prophylaxis: Enoxaparin 30 mg subcutaneous every 24 hours Code Status: Full code Diet: Heart healthy/carb modified Family Communication: updated Darnelle Catalan, daughter at bedside  Disposition Plan: pending clinical course  Consults called: None at this time Admission status: Observation, MedSurg, with telemetry  Past Medical History:  Diagnosis Date  . Anxiety   . GERD (gastroesophageal reflux disease)   . Hypertension   . PONV (postoperative nausea and vomiting)    Past Surgical History:  Procedure Laterality Date  . CATARACT EXTRACTION Bilateral   . ETHMOIDECTOMY Bilateral 03/10/2016   Procedure: ETHMOIDECTOMY;  Surgeon: Beverly Gust, MD;  Location: [place];  Service: ENT;  Laterality: Bilateral;  . FRONTAL SINUS EXPLORATION Bilateral 03/10/2016   Procedure: FRONTAL WITH TISSUE REMOVAL;  Surgeon: Beverly Gust, MD;  Location: [place];  Service: ENT;  Laterality: Bilateral;  . HERNIA REPAIR    . IMAGE GUIDED SINUS SURGERY Bilateral 03/10/2016   Procedure: IMAGE GUIDED SINUS SURGERY;  Surgeon: Beverly Gust, MD;  Location: [place];  Service: ENT;  Laterality: Bilateral;  Need Disk GAVE DISK TO CECE 1-25 KP  . MAXILLARY ANTROSTOMY Bilateral 03/10/2016   Procedure: MAXILLARY ANTROSTOMY;  Surgeon: Beverly Gust, MD;  Location: [place];  Service: ENT;  Laterality: Bilateral;  . NASAL TURBINATE REDUCTION Bilateral 03/10/2016   Procedure: TURBINATE REDUCTION/SUBMUCOSAL RESECTION;  Surgeon: Beverly Gust, MD;  Location: [place];  Service: ENT;  Laterality: Bilateral;  . SEPTOPLASTY Bilateral 03/10/2016   Procedure: SEPTOPLASTY;  Surgeon: Beverly Gust, MD;  Location: [place];  Service: ENT;  Laterality: Bilateral;  . SPHENOIDECTOMY Bilateral 03/10/2016   Procedure: SPHENOIDECTOMY WITH TISSUE REMOVAL;   Surgeon: Beverly Gust, MD;  Location: [place];  Service: ENT;  Laterality: Bilateral;   Social History:  reports that she has never smoked. She has never used smokeless tobacco. She reports that she does not drink alcohol and does not use drugs.  Allergies  Allergen Reactions  . Ciprofloxacin Itching   Family History  Problem Relation Age of Onset  . Epilepsy Mother   . Osteoporosis Mother   . COPD Father   . Hypertension Father   . CVA Father   . Allergies Father   . Lung cancer Father   . Emphysema Father   . Cancer Sister        breast  . Breast cancer Sister 91  . CVA Brother   . COPD Brother   . Heart disease Brother   . Arthritis Brother   . CVA Brother   . Cancer Brother        cancer  . Healthy Brother   . Cancer Maternal Grandmother   . Hypertension Maternal Grandmother   . Healthy Brother   . Osteoporosis Sister   . COPD Sister    Family history: Family history reviewed and not pertinent  Prior to Admission medications   Medication Sig Start Date End Date Taking? Authorizing Provider  acetaminophen (TYLENOL) 500 MG tablet Take 500 mg by mouth every 6 (six) hours as needed for mild pain.    Yes [provider]  albuterol (VENTOLIN HFA) 108 (90 Base)  MCG/ACT inhaler Inhale 2 puffs into the lungs every 6 (six) hours as needed for wheezing or shortness of breath. 12/17/18  Yes Parrett, Tammy S, NP  amLODipine (NORVASC) 5 MG tablet TAKE 1 TABLET (5 MG TOTAL) BY MOUTH DAILY. 05/07/20  Yes Jerrol Banana., MD  cetirizine (ZYRTEC ALLERGY) 10 MG tablet Take 1 tablet (10 mg total) by mouth daily. 08/22/17  Yes Flora Lipps, MD  famotidine (PEPCID) 40 MG tablet Take 40 mg by mouth at bedtime. 05/08/20  Yes [provider]  fluticasone (FLONASE) 50 MCG/ACT nasal spray 2 SPRAYS EACH NOSTRIL TWICE DAILY 07/04/16  Yes [provider]  ondansetron (ZOFRAN ODT) 4 MG disintegrating tablet Allow 1-2 tablets to dissolve in your mouth  every 8 hours as needed for nausea/vomiting 12/15/19  Yes Hinda Kehr, MD  pantoprazole (PROTONIX) 40 MG tablet 40 mg 2 (two) times daily. 30 minutes before meals 10/17/19  Yes [provider]  amoxicillin (AMOXIL) 500 MG capsule Take 1 capsule (500 mg total) by mouth 3 (three) times daily. Patient not taking: No sig reported 12/27/19   Versie Starks, PA-C  calcium carbonate (OS-CAL) 600 MG TABS tablet Take 600 mg by mouth. Takes occasionally Patient not taking: Reported on 06/01/2020    [provider]  citalopram (CELEXA) 10 MG tablet TAKE 1 TABLET (10 MG TOTAL) BY MOUTH DAILY. Patient not taking: Reported on 06/01/2020 08/07/19   Jerrol Banana., MD  FLOVENT HFA 110 MCG/ACT inhaler Inhale 2 puffs into the lungs 2 (two) times daily. Patient not taking: Reported on 06/01/2020 12/17/18   Parrett, Fonnie Mu, NP  sucralfate (CARAFATE) 1 g tablet Take 1 tablet (1 g total) by mouth 4 (four) times daily as needed (for abdominal discomfort, nausea, and/or vomiting). Patient not taking: Reported on 06/01/2020 12/15/19   Hinda Kehr, MD   Physical Exam: Vitals:   06/01/20 1800 06/01/20 1815 06/01/20 1830 06/01/20 1926  BP: (!) 167/67  (!) 163/66 (!) 158/57  Pulse: 75 66 76 63  Resp: 18 (!) 24 (!) 27 18  Temp:    98.2 F (36.8 C)  TempSrc:    Oral  SpO2: 100% 100% 100% 96%  Weight:      Height:       Constitutional: appears age-appropriate, NAD, calm, comfortable Eyes: PERRL, lids and conjunctivae normal ENMT: Mucous membranes are moist. Posterior pharynx clear of any exudate or lesions. Age-appropriate dentition.  Moderate hearing loss Neck: normal, supple, no masses, no thyromegaly Respiratory: clear to auscultation bilaterally, no wheezing, no crackles. Normal respiratory effort. No accessory muscle use.  Cardiovascular: Regular rate and rhythm, no murmurs / rubs / gallops. No extremity edema. 2+ pedal pulses. No carotid bruits.  Abdomen: no tenderness, no masses  palpated, no hepatosplenomegaly. Bowel sounds positive.  Musculoskeletal: no clubbing / cyanosis. No joint deformity upper and lower extremities. Good ROM, no contractures, no atrophy. Normal muscle tone.  Skin: no rashes, lesions, ulcers. No induration Neurologic: Sensation intact. Strength 5/5 in all 4.  Psychiatric: Normal judgment and insight. Alert and oriented x 3. Normal mood.   EKG: independently reviewed, showing sinus rhythm with rate of 77, QTc 433  Chest x-ray on Admission: I personally reviewed and I agree with radiologist reading as below.  CT Head Wo Contrast  Result Date: 06/01/2020 CLINICAL DATA:  Recurrent syncope with nausea and vomiting EXAM: CT HEAD WITHOUT CONTRAST TECHNIQUE: Contiguous axial images were obtained from the base of the skull through the vertex without intravenous contrast. COMPARISON:  December 27, 2019 FINDINGS: Brain: No evidence of acute large vascular territory infarction, hemorrhage, hydrocephalus, extra-axial collection or mass lesion/mass effect. Stable mild age related global parenchymal volume loss. Similar advanced burden chronic ischemic small vessel white matter disease. Vascular: No hyperdense vessel. Atherosclerotic calcifications of the internal carotid arteries at the skull base. Skull: Normal. Negative for fracture or focal lesion. Sinuses/Orbits: Scattered mucosal thickening of the ethmoid air cells and sphenoid sinuses. Mastoid air cells are predominantly clear. Orbits are grossly unremarkable. Other: None IMPRESSION: 1. No acute intracranial findings. 2. Stable age related global parenchymal volume loss and advanced chronic ischemic small vessel white matter disease. Electronically Signed   By: Dahlia Bailiff MD   On: 06/01/2020 15:55   MR BRAIN WO CONTRAST  Result Date: 06/01/2020 CLINICAL DATA:  Initial evaluation for acute syncope. EXAM: MRI HEAD WITHOUT CONTRAST TECHNIQUE: Multiplanar, multiecho pulse sequences of the brain and surrounding  structures were obtained without intravenous contrast. COMPARISON:  Prior head CT from earlier the same day. FINDINGS: Brain: Examination degraded by motion artifact. Diffuse prominence of the CSF containing spaces compatible with generalized age-related cerebral atrophy. Patchy and confluent T2/FLAIR hyperintensity within the periventricular and deep white matter both cerebral hemispheres as well as the pons, most consistent with chronic small vessel ischemic disease, fairly advanced in nature. No abnormal foci of restricted diffusion to suggest acute or subacute ischemia. Gray-white matter differentiation maintained. No encephalomalacia to suggest chronic cortical infarction. No foci of susceptibility artifact to suggest acute or chronic intracranial hemorrhage. No mass lesion, midline shift or mass effect. No hydrocephalus or extra-axial fluid collection. Pituitary gland suprasellar region within normal limits. Midline structures intact. Vascular: Major intracranial vascular flow voids are maintained. Skull and upper cervical spine: Craniocervical junction within normal limits. Bone marrow signal intensity normal. No focal marrow replacing lesion. No scalp soft tissue abnormality. Sinuses/Orbits: Patient status post bilateral ocular lens replacement. Globes and orbital soft tissues demonstrate no acute finding. Chronic mucosal thickening noted throughout the paranasal sinuses. No significant mastoid effusion. Inner ear structures grossly normal. Other: None. IMPRESSION: 1. No acute intracranial abnormality. 2. Age-related cerebral atrophy with advanced chronic microvascular ischemic disease. Electronically Signed   By: Jeannine Boga M.D.   On: 06/01/2020 21:24   DG Chest Portable 1 View  Result Date: 06/01/2020 CLINICAL DATA:  Syncope, weakness EXAM: PORTABLE CHEST 1 VIEW COMPARISON:  05/07/2019 FINDINGS: Heart size normal. Hiatal hernia. Aortic atherosclerosis. No pleural effusion or edema. No  airspace densities identified. IMPRESSION: 1. No acute cardiopulmonary abnormalities. 2. Hiatal hernia. Electronically Signed   By: Kerby Moors M.D.   On: 06/01/2020 16:04   Labs on Admission: I have personally reviewed following labs  CBC: Recent Labs  Lab 06/01/20 1425  WBC 10.4  NEUTROABS 7.9*  HGB 11.1*  HCT 34.2*  MCV 83.0  PLT 875   Basic Metabolic Panel: Recent Labs  Lab 06/01/20 1425  NA 138  K 3.9  CL 102  CO2 25  GLUCOSE 195*  BUN 23  CREATININE 1.48*  CALCIUM 9.2   GFR: Estimated Creatinine Clearance: 21.6 mL/min (A) (by C-G formula based on SCr of 1.48 mg/dL (H)).  Urine analysis:    Component Value Date/Time   COLORURINE YELLOW (A) 07/09/2019 1655   APPEARANCEUR CLEAR (A) 07/09/2019 1655   APPEARANCEUR CLEAR 07/01/2012 0330   LABSPEC 1.041 (H) 07/09/2019 1655   LABSPEC 1.015 07/01/2012 0330   PHURINE 6.0 07/09/2019 1655   GLUCOSEU NEGATIVE 07/09/2019 1655   GLUCOSEU NEGATIVE 07/01/2012 0330  HGBUR NEGATIVE 07/09/2019 1655   BILIRUBINUR negative 06/01/2020 1153   BILIRUBINUR NEGATIVE 07/01/2012 0330   KETONESUR 20 (A) 07/09/2019 1655   PROTEINUR Positive (A) 06/01/2020 1153   PROTEINUR NEGATIVE 07/09/2019 1655   UROBILINOGEN 1.0 06/01/2020 1153   NITRITE positive 06/01/2020 1153   NITRITE NEGATIVE 07/09/2019 1655   LEUKOCYTESUR Large (3+) (A) 06/01/2020 1153   LEUKOCYTESUR NEGATIVE 07/09/2019 1655   LEUKOCYTESUR NEGATIVE 07/01/2012 0330   Alec Mcphee N Laurelle Skiver D.O. [facility]  If 7PM-7AM, please contact overnight-coverage provider If 7AM-7PM, please contact day coverage provider [email or web address]  06/01/2020, 11:06 PM

## 2020-06-01 NOTE — ED Notes (Signed)
Pt alert  Family with pt.  Sinus at 62.  Iv fluids infusing.

## 2020-06-01 NOTE — Telephone Encounter (Signed)
Just this 1 time.  UA and urine CNS

## 2020-06-01 NOTE — Progress Notes (Signed)
Messaged MD regarding protonix and pepcid orders. She wants to continue both.

## 2020-06-01 NOTE — Progress Notes (Signed)
PHARMACIST - PHYSICIAN COMMUNICATION  CONCERNING:  Enoxaparin (Lovenox) for DVT Prophylaxis    RECOMMENDATION: Patient was prescribed enoxaprin 40mg  q24 hours for VTE prophylaxis.   Filed Weights   06/01/20 1422  Weight: 59 kg (130 lb)    Body mass index is 23.78 kg/m.  Estimated Creatinine Clearance: 21.6 mL/min (A) (by C-G formula based on SCr of 1.48 mg/dL (H)).   Patient is candidate for enoxaparin 30mg  every 24 hours based on CrCl <43ml/min DESCRIPTION: Pharmacy has adjusted enoxaparin dose per [facility] policy.  Patient is now receiving enoxaparin 30 mg every 24 hours    Murray Durrell, PharmD Clinical Pharmacist  06/01/2020 6:09 PM

## 2020-06-01 NOTE — ED Notes (Signed)
hospitalist in with pt and family now

## 2020-06-01 NOTE — Addendum Note (Signed)
Addended by: Minette Headland on: 06/01/2020 11:59 AM   Modules accepted: Orders

## 2020-06-01 NOTE — ED Notes (Signed)
Report messaged to stephanie rn floor nurse

## 2020-06-01 NOTE — ED Triage Notes (Addendum)
Pt to ER via [facility] after having witnessed syncopal episode by her daughter in the car. Pt also had multiple episodes of emesis. EMS administered 4mg  IV zofran in route. Pt with hx of severe GERD but does not take medications.  Ems VS- bp 152/79, cbg 174, Hr 78, O2 sats 95% on RA.   Per daughter- Pt was taken to PCP this morning for possible UTI.

## 2020-06-01 NOTE — ED Provider Notes (Signed)
[facility] Emergency Department Provider Note   ____________________________________________   Event Date/Time   First MD Initiated Contact with Patient 06/01/20 1425     (approximate)  I have reviewed the triage vital signs and the nursing notes.   HISTORY  Chief Complaint Loss of Consciousness  Intermittent HPI provided by her daughter.  HPI Kathy Pacheco is a 87 y.o. female presents to the ED via EMS, accompanied by her daughter, for evaluation of a witnessed syncopal episode.  The patient had presented to her PCP for evaluation of a suspected recurrent UTI.  They had completed the visit including leaving a urine sample, and were driving back home and the patient had a syncopal episode in the car.  The daughter reports pulling the car off road, and opening the patient's jaw to allow for unobstructed breathing.  Patient had to be shaken to be aroused.  She continued to be obtunded and altered. There was no reported seizure activity or incontinence.  Patient would note that she has told her that she had a UTI for the last week.  She has been taking increased doses of cranberry juice to help with her symptoms.  She noted yesterday that she began to feel more ill, and the daughter reports that the patient seemed confused yesterday and forgetful.  She presented today to provide a urine sample as suggested.  Has any fever, chills, sweats, chest pain, shortness of breath.     Past Medical History:  Diagnosis Date  . Anxiety   . GERD (gastroesophageal reflux disease)   . Hypertension   . PONV (postoperative nausea and vomiting)     Patient Active Problem List   Diagnosis Date Noted  . Chronic cough 12/17/2018  . Syncope 01/07/2017  . UTI (urinary tract infection) 01/07/2017  . Absolute anemia 11/03/2014  . Bilateral cataracts 11/03/2014  . COPD, mild ([place]) 11/03/2014  . Acute cystitis 11/03/2014  . 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 Esophagitis,  reflux 11/03/2014  . Anxiety, generalized 11/03/2014  . Acid reflux 11/03/2014  . Hypercholesteremia 11/03/2014  . HLD (hyperlipidemia) 11/03/2014  . BP (high blood pressure) 11/03/2014  . Affective disorder, major 11/03/2014  . Menopausal and perimenopausal disorder 11/03/2014  . Obstructive apnea 11/03/2014  . Awareness of heartbeats 11/03/2014  . Hypercholesterolemia without hypertriglyceridemia 11/03/2014  . Allergic rhinitis, seasonal 11/03/2014  . H/O cataract extraction 11/03/2014  . Anxiety 12/16/2013  . Apnea, sleep 12/16/2013    Past Surgical History:  Procedure Laterality Date  . CATARACT EXTRACTION Bilateral   . ETHMOIDECTOMY Bilateral 03/10/2016   Procedure: ETHMOIDECTOMY;  Surgeon: Beverly Gust, MD;  Location: [place];  Service: ENT;  Laterality: Bilateral;  . FRONTAL SINUS EXPLORATION Bilateral 03/10/2016   Procedure: FRONTAL WITH TISSUE REMOVAL;  Surgeon: Beverly Gust, MD;  Location: [place];  Service: ENT;  Laterality: Bilateral;  . HERNIA REPAIR    . IMAGE GUIDED SINUS SURGERY Bilateral 03/10/2016   Procedure: IMAGE GUIDED SINUS SURGERY;  Surgeon: Beverly Gust, MD;  Location: [place];  Service: ENT;  Laterality: Bilateral;  Need Disk GAVE DISK TO CECE 1-25 KP  . MAXILLARY ANTROSTOMY Bilateral 03/10/2016   Procedure: MAXILLARY ANTROSTOMY;  Surgeon: Beverly Gust, MD;  Location: [place];  Service: ENT;  Laterality: Bilateral;  . NASAL TURBINATE REDUCTION Bilateral 03/10/2016   Procedure: TURBINATE REDUCTION/SUBMUCOSAL RESECTION;  Surgeon: Beverly Gust, MD;  Location: [place];  Service: ENT;  Laterality: Bilateral;  . SEPTOPLASTY Bilateral 03/10/2016  Procedure: SEPTOPLASTY;  Surgeon: Beverly Gust, MD;  Location: [place];  Service: ENT;  Laterality: Bilateral;  . SPHENOIDECTOMY Bilateral 03/10/2016   Procedure: SPHENOIDECTOMY WITH TISSUE REMOVAL;  Surgeon: Beverly Gust, MD;  Location:  [place];  Service: ENT;  Laterality: Bilateral;    Prior to Admission medications   Medication Sig Start Date End Date Taking? Authorizing Provider  acetaminophen (TYLENOL) 500 MG tablet Take 500 mg by mouth every 6 (six) hours as needed for mild pain.    Yes [provider]  albuterol (VENTOLIN HFA) 108 (90 Base) MCG/ACT inhaler Inhale 2 puffs into the lungs every 6 (six) hours as needed for wheezing or shortness of breath. 12/17/18  Yes Parrett, Tammy S, NP  amLODipine (NORVASC) 5 MG tablet TAKE 1 TABLET (5 MG TOTAL) BY MOUTH DAILY. 05/07/20  Yes Jerrol Banana., MD  cetirizine (ZYRTEC ALLERGY) 10 MG tablet Take 1 tablet (10 mg total) by mouth daily. 08/22/17  Yes Flora Lipps, MD  famotidine (PEPCID) 40 MG tablet Take 40 mg by mouth at bedtime. 05/08/20  Yes [provider]  fluticasone (FLONASE) 50 MCG/ACT nasal spray 2 SPRAYS EACH NOSTRIL TWICE DAILY 07/04/16  Yes [provider]  ondansetron (ZOFRAN ODT) 4 MG disintegrating tablet Allow 1-2 tablets to dissolve in your mouth every 8 hours as needed for nausea/vomiting 12/15/19  Yes Hinda Kehr, MD  pantoprazole (PROTONIX) 40 MG tablet 40 mg 2 (two) times daily. 30 minutes before meals 10/17/19  Yes [provider]  amoxicillin (AMOXIL) 500 MG capsule Take 1 capsule (500 mg total) by mouth 3 (three) times daily. Patient not taking: No sig reported 12/27/19   Versie Starks, PA-C  calcium carbonate (OS-CAL) 600 MG TABS tablet Take 600 mg by mouth. Takes occasionally Patient not taking: Reported on 06/01/2020    [provider]  citalopram (CELEXA) 10 MG tablet TAKE 1 TABLET (10 MG TOTAL) BY MOUTH DAILY. Patient not taking: Reported on 06/01/2020 08/07/19   Jerrol Banana., MD  FLOVENT HFA 110 MCG/ACT inhaler Inhale 2 puffs into the lungs 2 (two) times daily. Patient not taking: Reported on 06/01/2020 12/17/18   Parrett, Fonnie Mu, NP  sucralfate (CARAFATE) 1 g tablet Take 1 tablet (1  g total) by mouth 4 (four) times daily as needed (for abdominal discomfort, nausea, and/or vomiting). Patient not taking: Reported on 06/01/2020 12/15/19   Hinda Kehr, MD    Allergies Ciprofloxacin  Family History  Problem Relation Age of Onset  . Epilepsy Mother   . Osteoporosis Mother   . COPD Father   . Hypertension Father   . CVA Father   . Allergies Father   . Lung cancer Father   . Emphysema Father   . Cancer Sister        breast  . Breast cancer Sister 82  . CVA Brother   . COPD Brother   . Heart disease Brother   . Arthritis Brother   . CVA Brother   . Cancer Brother        cancer  . Healthy Brother   . Cancer Maternal Grandmother   . Hypertension Maternal Grandmother   . Healthy Brother   . Osteoporosis Sister   . COPD Sister     Social History Social History   Tobacco Use  . Smoking status: Never Smoker  . Smokeless tobacco: Never Used  Vaping Use  . Vaping Use: Never used  Substance Use Topics  . Alcohol use: No  Alcohol/week: 0.0 standard drinks  . Drug use: No    Review of Systems  Constitutional: No fever/chills. Altered mental status Eyes: No visual changes. ENT: No sore throat. Cardiovascular: Denies chest pain. Respiratory: Denies shortness of breath. Gastrointestinal: No abdominal pain.  No nausea, no vomiting.  No diarrhea.  No constipation. Genitourinary: Positive for dysuria. Reports urinary frequency Musculoskeletal: Negative for back pain. Skin: Negative for rash. Neurological: Negative for headaches, focal weakness or numbness. ____________________________________________   PHYSICAL EXAM:  VITAL SIGNS: ED Triage Vitals  Enc Vitals Group     BP 06/01/20 1422 (!) 134/57     Pulse Rate 06/01/20 1422 82     Resp 06/01/20 1422 18     Temp 06/01/20 1426 97.7 F (36.5 C)     Temp Source 06/01/20 1426 Oral     SpO2 06/01/20 1422 91 %     Weight 06/01/20 1422 130 lb (59 kg)     Height 06/01/20 1422 5\' 2"  (1.575 m)      Head Circumference --      Peak Flow --      Pain Score 06/01/20 1422 0     Pain Loc --      Pain Edu? --      Excl. in [place]? --     Constitutional: Alert and oriented. Well appearing and in no acute distress. Eyes: Conjunctivae are normal. PERRL. EOMI. Head: Atraumatic. Nose: No congestion/rhinnorhea. Mouth/Throat: Mucous membranes are moist.  Oropharynx non-erythematous. Neck: No stridor.   Cardiovascular: Normal rate, regular rhythm. Grossly normal heart sounds.  Good peripheral circulation. Respiratory: Normal respiratory effort.  No retractions. Lungs CTAB. Gastrointestinal: Soft and nontender. No distention. No abdominal bruits. No CVA tenderness. Musculoskeletal: No lower extremity tenderness nor edema.  No joint effusions. Neurologic:  Normal speech and language. No gross focal neurologic deficits are appreciated. No gait instability. Skin:  Skin is warm, dry and intact. No rash noted. Psychiatric: Mood and affect are normal. Speech and behavior are normal. ____________________________________________   LABS (all labs ordered are listed, but only abnormal results are displayed)  Labs Reviewed  BASIC METABOLIC PANEL - Abnormal; Notable for the following components:      Result Value   Glucose, Bld 195 (*)    Creatinine, Ser 1.48 (*)    GFR, Estimated 34 (*)    All other components within normal limits  CBC WITH DIFFERENTIAL/PLATELET - Abnormal; Notable for the following components:   Hemoglobin 11.1 (*)    HCT 34.2 (*)    RDW 16.3 (*)    Neutro Abs 7.9 (*)    All other components within normal limits  RESP PANEL BY RT-PCR (FLU A&B, COVID) ARPGX2  URINALYSIS, COMPLETE (UACMP) WITH MICROSCOPIC  COMPREHENSIVE METABOLIC PANEL  CBC  TSH  TROPONIN I (HIGH SENSITIVITY)  TROPONIN I (HIGH SENSITIVITY)   ____________________________________________  EKG  Vent. rate 77 BPM PR interval 186 ms QRS duration 103 ms QT/QTcB 382/433 ms P-R-T axes 92 59  60 ____________________________________________  RADIOLOGY I, Melvenia Needles, personally viewed and evaluated these images (plain radiographs) as part of my medical decision making, as well as reviewing the written report by the radiologist.  ED MD interpretation:  Agree with report  Official radiology report(s): CT Head Wo Contrast  Result Date: 06/01/2020 CLINICAL DATA:  Recurrent syncope with nausea and vomiting EXAM: CT HEAD WITHOUT CONTRAST TECHNIQUE: Contiguous axial images were obtained from the base of the skull through the vertex without intravenous contrast. COMPARISON:  December 27, 2019  FINDINGS: Brain: No evidence of acute large vascular territory infarction, hemorrhage, hydrocephalus, extra-axial collection or mass lesion/mass effect. Stable mild age related global parenchymal volume loss. Similar advanced burden chronic ischemic small vessel white matter disease. Vascular: No hyperdense vessel. Atherosclerotic calcifications of the internal carotid arteries at the skull base. Skull: Normal. Negative for fracture or focal lesion. Sinuses/Orbits: Scattered mucosal thickening of the ethmoid air cells and sphenoid sinuses. Mastoid air cells are predominantly clear. Orbits are grossly unremarkable. Other: None IMPRESSION: 1. No acute intracranial findings. 2. Stable age related global parenchymal volume loss and advanced chronic ischemic small vessel white matter disease. Electronically Signed   By: Dahlia Bailiff MD   On: 06/01/2020 15:55   DG Chest Portable 1 View  Result Date: 06/01/2020 CLINICAL DATA:  Syncope, weakness EXAM: PORTABLE CHEST 1 VIEW COMPARISON:  05/07/2019 FINDINGS: Heart size normal. Hiatal hernia. Aortic atherosclerosis. No pleural effusion or edema. No airspace densities identified. IMPRESSION: 1. No acute cardiopulmonary abnormalities. 2. Hiatal hernia. Electronically Signed   By: Kerby Moors M.D.   On: 06/01/2020 16:04    ____________________________________________   PROCEDURES  Procedure(s) performed (including Critical Care):  Procedures  Ceftriaxone 1 g IVPB Zofran 4 mg ODT ____________________________________________   INITIAL IMPRESSION / ASSESSMENT AND PLAN / ED COURSE  As part of my medical decision making, I reviewed the following data within the [place] History obtained from family, Labs reviewed nitrite + urine from PCP office, Discussed with admitting physician A. Cox, DO and Notes from prior ED visits     Differential diagnosis includes, but is not limited to, alcohol, illicit or prescription medications, or other toxic ingestion; intracranial pathology such as stroke or intracerebral hemorrhage; fever or infectious causes including sepsis; hypoxemia and/or hypercarbia; uremia; trauma; endocrine related disorders such as diabetes, hypoglycemia, and thyroid-related diseases; hypertensive encephalopathy; etc.  Geriatric patient ED evaluation of altered mental status and concern for possible UTI after a witnessed syncopal episode.  Patient was evaluated for her complaints in the ED including labs and imaging.  Urinalysis provided by the PCP office today did confirm nitrate positive urine. She will be treated for a UTI empirically.  Patient is adult daughter is at bedside, and the patient along with daughter are agreeable to admission for further evaluation and management of her UTI and altered mental status.  Consultation is also been placed at the daughter's request, for social work to evaluate for medication assistance and any home health needs. ____________________________________________   FINAL CLINICAL IMPRESSION(S) / ED DIAGNOSES  Final diagnoses:  Syncope and collapse  Acute cystitis without hematuria  Altered mental status, unspecified altered mental status type     ED Discharge Orders    None      *Please note:  Kathy Pacheco was evaluated in  Emergency Department on 06/01/2020 for the symptoms described in the history of present illness. She was evaluated in the context of the global COVID-19 pandemic, which necessitated consideration that the patient might be at risk for infection with the SARS-CoV-2 virus that causes COVID-19. Institutional protocols and algorithms that pertain to the evaluation of patients at risk for COVID-19 are in a state of rapid change based on information released by regulatory bodies including the CDC and federal and state organizations. These policies and algorithms were followed during the patient's care in the ED.  Some ED evaluations and interventions may be delayed as a result of limited staffing during and the pandemic.*   Note:  This document was  prepared using Systems analyst and may include unintentional dictation errors.    Melvenia Needles, PA-C 06/01/20 1821    Duffy Bruce, MD 06/06/20 2123

## 2020-06-01 NOTE — Telephone Encounter (Signed)
Message was never delivered to patient, 9 o'clock time slot is gone. Is it okay for patient to drop off urine specimen? KW

## 2020-06-01 NOTE — ED Notes (Signed)
Resumed care from [employer].  Pt alert  Family with pt.  nsr on monitor.  Iv in place.

## 2020-06-02 ENCOUNTER — Observation Stay (HOSPITAL_BASED_OUTPATIENT_CLINIC_OR_DEPARTMENT_OTHER)
Admit: 2020-06-02 | Discharge: 2020-06-02 | Disposition: A | Discharge status: Home / Self Care | Payer: Medicare Other | Attending: Internal Medicine | Admitting: Internal Medicine

## 2020-06-02 DIAGNOSIS — R55 Syncope and collapse: Secondary | ICD-10-CM

## 2020-06-02 LAB — COMPREHENSIVE METABOLIC PANEL
ALT: 9 U/L (ref 0–44)
AST: 12 U/L — ABNORMAL LOW (ref 15–41)
Albumin: 2.8 g/dL — ABNORMAL LOW (ref 3.5–5.0)
Alkaline Phosphatase: 67 U/L (ref 38–126)
Anion gap: 5 (ref 5–15)
BUN: 18 mg/dL (ref 8–23)
CO2: 26 mmol/L (ref 22–32)
Calcium: 8.3 mg/dL — ABNORMAL LOW (ref 8.9–10.3)
Chloride: 106 mmol/L (ref 98–111)
Creatinine, Ser: 1.16 mg/dL — ABNORMAL HIGH (ref 0.44–1.00)
GFR, Estimated: 46 mL/min — ABNORMAL LOW (ref >60)
Glucose, Bld: 94 mg/dL (ref 70–99)
Potassium: 3.8 mmol/L (ref 3.5–5.1)
Sodium: 137 mmol/L (ref 135–145)
Total Bilirubin: 0.6 mg/dL (ref 0.3–1.2)
Total Protein: 5.9 g/dL — ABNORMAL LOW (ref 6.5–8.1)

## 2020-06-02 LAB — GLUCOSE, CAPILLARY: Glucose-Capillary: 97 mg/dL (ref 70–99)

## 2020-06-02 LAB — CBC
HCT: 30.6 % — ABNORMAL LOW (ref 36.0–46.0)
Hemoglobin: 9.7 g/dL — ABNORMAL LOW (ref 12.0–15.0)
MCH: 26.2 pg (ref 26.0–34.0)
MCHC: 31.7 g/dL (ref 30.0–36.0)
MCV: 82.7 fL (ref 80.0–100.0)
Platelets: 249 10*3/uL (ref 150–400)
RBC: 3.7 MIL/uL — ABNORMAL LOW (ref 3.87–5.11)
RDW: 16.5 % — ABNORMAL HIGH (ref 11.5–15.5)
WBC: 7 10*3/uL (ref 4.0–10.5)
nRBC: 0 % (ref 0.0–0.2)

## 2020-06-02 LAB — ECHOCARDIOGRAM COMPLETE
AR max vel: 2.34 cm2
AV Area VTI: 2.39 cm2
AV Area mean vel: 2.24 cm2
AV Mean grad: 9 mmHg
AV Peak grad: 15.1 mmHg
Ao pk vel: 1.94 m/s
Area-P 1/2: 5.27 cm2
Height: 62 in
MV VTI: 2.61 cm2
S' Lateral: 2.3 cm
Weight: 2067.2 oz

## 2020-06-02 LAB — URINALYSIS, COMPLETE (UACMP) WITH MICROSCOPIC
Bacteria, UA: NONE SEEN
Bilirubin Urine: NEGATIVE
Glucose, UA: NEGATIVE mg/dL
Hgb urine dipstick: NEGATIVE
Ketones, ur: NEGATIVE mg/dL
Nitrite: NEGATIVE
Protein, ur: NEGATIVE mg/dL
Specific Gravity, Urine: 1.018 (ref 1.005–1.030)
WBC, UA: >50 WBC/hpf — ABNORMAL HIGH (ref 0–5)
pH: 5 (ref 5.0–8.0)

## 2020-06-02 MED ORDER — CEPHALEXIN 500 MG PO CAPS: 500.0000 mg | ORAL_CAPSULE | Freq: Three times a day (TID) | ORAL | 0 refills | Status: AC

## 2020-06-02 MED ORDER — CEPHALEXIN 500 MG PO CAPS
500.0000 mg | ORAL_CAPSULE | Freq: Three times a day (TID) | ORAL | Status: DC
  Administered 2020-06-02: 500 mg via ORAL
  Filled 2020-06-02: qty 1

## 2020-06-02 MED ORDER — FLUTICASONE PROPIONATE 50 MCG/ACT NA SUSP: 1.0000 | Freq: Every day | NASAL | 9 refills | Status: DC | PRN

## 2020-06-02 NOTE — Progress Notes (Signed)
Pt d/c home with self care, discharge education and information provided to patient and daughter at bedside, both verbalized understanding. Discharge paperwork given to patient, PIV removed, pt off unit via [place] accompanied by this RN.  2:38 PM 06/02/20 Pamelia Hoit Zyire Eidson

## 2020-06-02 NOTE — Plan of Care (Signed)
Improve knowledge during admission

## 2020-06-02 NOTE — TOC Progression Note (Signed)
Transition of Care [facility]) - Progression Note    Patient Details  Name: Kathy Pacheco MRN: [id number] Date of Birth: Nov 08, 1931  [facility] CM/SW Contact  Su Hilt, RN Phone Number: 06/02/2020, 12:15 PM  Clinical Narrative:   Spoke with the daughter [name], she stated that she has been managing the patient's medications using a pill box weekly, she stated that is not working well, I encouraged her to possibly start managing daily instead, she asked if there was other resources to manage the patient's meds daily, I explained there is no resource for that but possible to enlist other family or friends to help, she stated appreciation and said there are no other needs         Expected Discharge Plan and Services                                                 Social Determinants of Health (SDOH) Interventions    Readmission Risk Interventions No flowsheet data found.

## 2020-06-02 NOTE — Progress Notes (Signed)
*  PRELIMINARY RESULTS* Echocardiogram 2D Echocardiogram has been performed.  Kathy Pacheco 06/02/2020, 11:08 AM

## 2020-06-02 NOTE — Discharge Summary (Signed)
Physician Discharge Summary   Kathy Pacheco  female DOB: [birth year]  [id number]  PCP: Jerrol Banana., MD  Admit date: 06/01/2020 Discharge date: 06/02/2020  Admitted From: home Disposition:  home Daughter updated at bedside prior to discharge.  CODE STATUS: Full code   Hospital Course:  For full details, please see H&P, progress notes, consult notes and ancillary notes.  Briefly,  Kathy Pacheco is a 87 y.o. female with medical history significant for hypertension, seasonal allergies, GERD, depression, presented to the emergency department for chief concerns of a syncopal event.  Pt and daughter just had lunch out.  While in the car on their way home, patient endorsed an episode of burning in her chest and feeling nauseated. Then patient had a gurgling episode and passed out for 10 secs. Daugther denies noting abnormal movements or seizure-like activity, urinary or bowel incontinence.  Patient states that she is unaware that she passed out.  Syncopal episode likely vasovagal  Pt ordered MRI brain on admission which was neg for acute finding.  Echo wnl.  Trop neg.  Pt felt well prior to discharge.  Hypertension Continue home amlodipine  Urinary tract infection Pt confirmed dysuria.  Pt received ceftriaxone on presentation and discharged home with 4 more days of Keflex.  GERD Continue home PPI and Famotidine   Discharge Diagnoses:  Principal Problem:   Syncope Active Problems:   Anxiety   COPD, mild (HCC)   Acute cystitis   Acid reflux   HLD (hyperlipidemia)   Allergic rhinitis, seasonal   Apnea, sleep   UTI (urinary tract infection)     Discharge Instructions:  Allergies as of 06/02/2020      Reactions   Ciprofloxacin Itching      Medication List    STOP taking these medications   amoxicillin 500 MG capsule Commonly known as: AMOXIL   Flovent HFA 110 MCG/ACT inhaler Generic drug: fluticasone     TAKE these medications    acetaminophen 500 MG tablet Commonly known as: TYLENOL Take 500 mg by mouth every 6 (six) hours as needed for mild pain.   albuterol 108 (90 Base) MCG/ACT inhaler Commonly known as: VENTOLIN HFA Inhale 2 puffs into the lungs every 6 (six) hours as needed for wheezing or shortness of breath.   amLODipine 5 MG tablet Commonly known as: NORVASC TAKE 1 TABLET (5 MG TOTAL) BY MOUTH DAILY.   cephALEXin 500 MG capsule Commonly known as: KEFLEX Take 1 capsule (500 mg total) by mouth every 8 (eight) hours for 4 days. For presumed urinary track infection.   cetirizine 10 MG tablet Commonly known as: ZyrTEC Allergy Take 1 tablet (10 mg total) by mouth daily.   famotidine 40 MG tablet Commonly known as: PEPCID Take 40 mg by mouth at bedtime.   fluticasone 50 MCG/ACT nasal spray Commonly known as: FLONASE Place 1 spray into both nostrils daily as needed for allergies or rhinitis. What changed: See the new instructions.   ondansetron 4 MG disintegrating tablet Commonly known as: Zofran ODT Allow 1-2 tablets to dissolve in your mouth every 8 hours as needed for nausea/vomiting   pantoprazole 40 MG tablet Commonly known as: PROTONIX 40 mg 2 (two) times daily. 30 minutes before meals         Allergies  Allergen Reactions  . Ciprofloxacin Itching     The results of significant diagnostics from this hospitalization (including imaging, microbiology, ancillary and laboratory) are listed below for reference.   Consultations:  Procedures/Studies: CT Head Wo Contrast  Result Date: 06/01/2020 CLINICAL DATA:  Recurrent syncope with nausea and vomiting EXAM: CT HEAD WITHOUT CONTRAST TECHNIQUE: Contiguous axial images were obtained from the base of the skull through the vertex without intravenous contrast. COMPARISON:  December 27, 2019 FINDINGS: Brain: No evidence of acute large vascular territory infarction, hemorrhage, hydrocephalus, extra-axial collection or mass lesion/mass  effect. Stable mild age related global parenchymal volume loss. Similar advanced burden chronic ischemic small vessel white matter disease. Vascular: No hyperdense vessel. Atherosclerotic calcifications of the internal carotid arteries at the skull base. Skull: Normal. Negative for fracture or focal lesion. Sinuses/Orbits: Scattered mucosal thickening of the ethmoid air cells and sphenoid sinuses. Mastoid air cells are predominantly clear. Orbits are grossly unremarkable. Other: None IMPRESSION: 1. No acute intracranial findings. 2. Stable age related global parenchymal volume loss and advanced chronic ischemic small vessel white matter disease. Electronically Signed   By: Dahlia Bailiff MD   On: 06/01/2020 15:55   MR BRAIN WO CONTRAST  Result Date: 06/01/2020 CLINICAL DATA:  Initial evaluation for acute syncope. EXAM: MRI HEAD WITHOUT CONTRAST TECHNIQUE: Multiplanar, multiecho pulse sequences of the brain and surrounding structures were obtained without intravenous contrast. COMPARISON:  Prior head CT from earlier the same day. FINDINGS: Brain: Examination degraded by motion artifact. Diffuse prominence of the CSF containing spaces compatible with generalized age-related cerebral atrophy. Patchy and confluent T2/FLAIR hyperintensity within the periventricular and deep white matter both cerebral hemispheres as well as the pons, most consistent with chronic small vessel ischemic disease, fairly advanced in nature. No abnormal foci of restricted diffusion to suggest acute or subacute ischemia. Gray-white matter differentiation maintained. No encephalomalacia to suggest chronic cortical infarction. No foci of susceptibility artifact to suggest acute or chronic intracranial hemorrhage. No mass lesion, midline shift or mass effect. No hydrocephalus or extra-axial fluid collection. Pituitary gland suprasellar region within normal limits. Midline structures intact. Vascular: Major intracranial vascular flow voids are  maintained. Skull and upper cervical spine: Craniocervical junction within normal limits. Bone marrow signal intensity normal. No focal marrow replacing lesion. No scalp soft tissue abnormality. Sinuses/Orbits: Patient status post bilateral ocular lens replacement. Globes and orbital soft tissues demonstrate no acute finding. Chronic mucosal thickening noted throughout the paranasal sinuses. No significant mastoid effusion. Inner ear structures grossly normal. Other: None. IMPRESSION: 1. No acute intracranial abnormality. 2. Age-related cerebral atrophy with advanced chronic microvascular ischemic disease. Electronically Signed   By: Jeannine Boga M.D.   On: 06/01/2020 21:24   DG Chest Portable 1 View  Result Date: 06/01/2020 CLINICAL DATA:  Syncope, weakness EXAM: PORTABLE CHEST 1 VIEW COMPARISON:  05/07/2019 FINDINGS: Heart size normal. Hiatal hernia. Aortic atherosclerosis. No pleural effusion or edema. No airspace densities identified. IMPRESSION: 1. No acute cardiopulmonary abnormalities. 2. Hiatal hernia. Electronically Signed   By: Kerby Moors M.D.   On: 06/01/2020 16:04      Labs: BNP (last 3 results) No results for input(s): BNP in the last 8760 hours. Basic Metabolic Panel: Recent Labs  Lab 06/01/20 1425 06/02/20 0600  NA 138 137  K 3.9 3.8  CL 102 106  CO2 25 26  GLUCOSE 195* 94  BUN 23 18  CREATININE 1.48* 1.16*  CALCIUM 9.2 8.3*   Liver Function Tests: Recent Labs  Lab 06/02/20 0600  AST 12*  ALT 9  ALKPHOS 67  BILITOT 0.6  PROT 5.9*  ALBUMIN 2.8*   No results for input(s): LIPASE, AMYLASE in the last 168 hours. No results for input(s):  AMMONIA in the last 168 hours. CBC: Recent Labs  Lab 06/01/20 1425 06/02/20 0600  WBC 10.4 7.0  NEUTROABS 7.9*  --   HGB 11.1* 9.7*  HCT 34.2* 30.6*  MCV 83.0 82.7  PLT 285 249   Cardiac Enzymes: No results for input(s): CKTOTAL, CKMB, CKMBINDEX, TROPONINI in the last 168 hours. BNP: Invalid input(s):  POCBNP CBG: Recent Labs  Lab 06/02/20 0507  GLUCAP 97   D-Dimer No results for input(s): DDIMER in the last 72 hours. Hgb A1c No results for input(s): HGBA1C in the last 72 hours. Lipid Profile No results for input(s): CHOL, HDL, LDLCALC, TRIG, CHOLHDL, LDLDIRECT in the last 72 hours. Thyroid function studies Recent Labs    06/01/20 1425  TSH 2.031   Anemia work up No results for input(s): VITAMINB12, FOLATE, FERRITIN, TIBC, IRON, RETICCTPCT in the last 72 hours. Urinalysis    Component Value Date/Time   COLORURINE YELLOW (A) 06/01/2020 0621   APPEARANCEUR CLOUDY (A) 06/01/2020 0621   APPEARANCEUR CLEAR 07/01/2012 0330   LABSPEC 1.018 06/01/2020 0621   LABSPEC 1.015 07/01/2012 0330   PHURINE 5.0 06/01/2020 0621   GLUCOSEU NEGATIVE 06/01/2020 0621   GLUCOSEU NEGATIVE 07/01/2012 0330   HGBUR NEGATIVE 06/01/2020 0621   BILIRUBINUR negative 06/01/2020 1153   BILIRUBINUR NEGATIVE 07/01/2012 0330   KETONESUR NEGATIVE 06/01/2020 0621   PROTEINUR Positive (A) 06/01/2020 1153   PROTEINUR NEGATIVE 06/01/2020 0621   UROBILINOGEN 1.0 06/01/2020 1153   NITRITE positive 06/01/2020 1153   NITRITE NEGATIVE 06/01/2020 0621   LEUKOCYTESUR Large (3+) (A) 06/01/2020 1153   LEUKOCYTESUR LARGE (A) 06/01/2020 0621   LEUKOCYTESUR NEGATIVE 07/01/2012 0330   Sepsis Labs Invalid input(s): PROCALCITONIN,  WBC,  LACTICIDVEN Microbiology Recent Results (from the past 240 hour(s))  Resp Panel by RT-PCR (Flu A&B, Covid) Nasopharyngeal Swab     Status: None   Collection Time: 06/01/20  3:45 PM   Specimen: Nasopharyngeal Swab; Nasopharyngeal(NP) swabs in vial transport medium  Result Value Ref Range Status   SARS Coronavirus 2 by RT PCR NEGATIVE NEGATIVE Final    Comment: (NOTE) SARS-CoV-2 target nucleic acids are NOT DETECTED.  The SARS-CoV-2 RNA is generally detectable in upper respiratory specimens during the acute phase of infection. The lowest concentration of SARS-CoV-2 viral copies  this assay can detect is 138 copies/mL. A negative result does not preclude SARS-Cov-2 infection and should not be used as the sole basis for treatment or other patient management decisions. A negative result may occur with  improper specimen collection/handling, submission of specimen other than nasopharyngeal swab, presence of viral mutation(s) within the areas targeted by this assay, and inadequate number of viral copies(<138 copies/mL). A negative result must be combined with clinical observations, patient history, and epidemiological information. The expected result is Negative.  Fact Sheet for Patients:  EntrepreneurPulse.com.au  Fact Sheet for Healthcare Providers:  IncredibleEmployment.be  This test is no t yet approved or cleared by the [place] FDA and  has been authorized for detection and/or diagnosis of SARS-CoV-2 by FDA under an Emergency Use Authorization (EUA). This EUA will remain  in effect (meaning this test can be used) for the duration of the COVID-19 declaration under Section 564(b)(1) of the Act, 21 U.S.C.section 360bbb-3(b)(1), unless the authorization is terminated  or revoked sooner.       Influenza A by PCR NEGATIVE NEGATIVE Final   Influenza B by PCR NEGATIVE NEGATIVE Final    Comment: (NOTE) The Xpert Xpress SARS-CoV-2/FLU/RSV plus assay is intended as an aid in  the diagnosis of influenza from Nasopharyngeal swab specimens and should not be used as a sole basis for treatment. Nasal washings and aspirates are unacceptable for Xpert Xpress SARS-CoV-2/FLU/RSV testing.  Fact Sheet for Patients: EntrepreneurPulse.com.au  Fact Sheet for Healthcare Providers: IncredibleEmployment.be  This test is not yet approved or cleared by the [place] FDA and has been authorized for detection and/or diagnosis of SARS-CoV-2 by FDA under an Emergency Use Authorization (EUA). This EUA  will remain in effect (meaning this test can be used) for the duration of the COVID-19 declaration under Section 564(b)(1) of the Act, 21 U.S.C. section 360bbb-3(b)(1), unless the authorization is terminated or revoked.  Performed at [facility], [address] [place], [place] [address]      Total time spend on discharging this patient, including the last patient exam, discussing the hospital stay, instructions for ongoing care as it relates to all pertinent caregivers, as well as preparing the medical discharge records, prescriptions, and/or referrals as applicable, is 40 minutes.    Enzo Bi, MD  [facility] 06/02/2020, 1:47 PM

## 2020-06-07 LAB — URINE CULTURE

## 2020-06-21 ENCOUNTER — Inpatient Hospital Stay: Payer: Medicare Other | Admitting: Family Medicine

## 2020-06-21 NOTE — Progress Notes (Deleted)
      Established patient visit   Patient: Kathy Pacheco   DOB: [birth year]   87 y.o. Female  MRN: [id number] Visit Date: 06/21/2020  Today's healthcare provider: [name], MD   No chief complaint on file.  Subjective    HPI  Follow up Hospitalization  Patient was admitted to [facility] on 06/01/2020 and discharged on 06/02/2020. She was treated for Syncope. Treatment for this included; see notes in chart. Telephone follow up was done on none She reports {excellent/good/fair:19992} compliance with treatment. She reports this condition is {resolved/improved/worsened:23923}.  ----------------------------------------------------------------------------------------- -   {Show patient history (optional):23778::" "}   Medications: Outpatient Medications Prior to Visit  Medication Sig  . acetaminophen (TYLENOL) 500 MG tablet Take 500 mg by mouth every 6 (six) hours as needed for mild pain.   Marland Kitchen albuterol (VENTOLIN HFA) 108 (90 Base) MCG/ACT inhaler Inhale 2 puffs into the lungs every 6 (six) hours as needed for wheezing or shortness of breath.  Marland Kitchen amLODipine (NORVASC) 5 MG tablet TAKE 1 TABLET (5 MG TOTAL) BY MOUTH DAILY.  . cetirizine (ZYRTEC ALLERGY) 10 MG tablet Take 1 tablet (10 mg total) by mouth daily.  . famotidine (PEPCID) 40 MG tablet Take 40 mg by mouth at bedtime.  . fluticasone (FLONASE) 50 MCG/ACT nasal spray Place 1 spray into both nostrils daily as needed for allergies or rhinitis.  Marland Kitchen ondansetron (ZOFRAN ODT) 4 MG disintegrating tablet Allow 1-2 tablets to dissolve in your mouth every 8 hours as needed for nausea/vomiting  . pantoprazole (PROTONIX) 40 MG tablet 40 mg 2 (two) times daily. 30 minutes before meals   No facility-administered medications prior to visit.    Review of Systems  Constitutional: Negative for appetite change, chills, fatigue and fever.  Respiratory: Negative for chest tightness and shortness of breath.   Cardiovascular: Negative for chest  pain and palpitations.  Gastrointestinal: Negative for abdominal pain, nausea and vomiting.  Neurological: Negative for dizziness and weakness.    {Labs  Heme  Chem  Endocrine  Serology  Results Review (optional):23779::" "}   Objective    There were no vitals taken for this visit. {Show previous vital signs (optional):23777::" "}   Physical Exam  ***  No results found for any visits on 06/21/20.  Assessment & Plan     ***  No follow-ups on file.      {provider attestation***:1}   [name], MD  [facility] [phone] (phone) [phone] (fax)  [place]

## 2020-06-22 NOTE — Telephone Encounter (Signed)
Error

## 2020-07-20 ENCOUNTER — Ambulatory Visit

## 2020-07-21 ENCOUNTER — Other Ambulatory Visit: Payer: Self-pay

## 2020-07-21 ENCOUNTER — Emergency Department: Payer: Medicare Other

## 2020-07-21 ENCOUNTER — Emergency Department
Admission: EM | Admit: 2020-07-21 | Discharge: 2020-07-21 | Disposition: A | Discharge status: Home / Self Care | Payer: Medicare Other | Attending: Emergency Medicine | Admitting: Emergency Medicine

## 2020-07-21 DIAGNOSIS — R42 Dizziness and giddiness: Secondary | ICD-10-CM | POA: Diagnosis not present

## 2020-07-21 DIAGNOSIS — R110 Nausea: Secondary | ICD-10-CM | POA: Diagnosis not present

## 2020-07-21 DIAGNOSIS — I10 Essential (primary) hypertension: Secondary | ICD-10-CM | POA: Diagnosis not present

## 2020-07-21 DIAGNOSIS — Z79899 Other long term (current) drug therapy: Secondary | ICD-10-CM | POA: Diagnosis not present

## 2020-07-21 DIAGNOSIS — R0902 Hypoxemia: Secondary | ICD-10-CM | POA: Diagnosis not present

## 2020-07-21 DIAGNOSIS — J449 Chronic obstructive pulmonary disease, unspecified: Secondary | ICD-10-CM | POA: Insufficient documentation

## 2020-07-21 LAB — URINALYSIS, COMPLETE (UACMP) WITH MICROSCOPIC
Bacteria, UA: NONE SEEN
Bilirubin Urine: NEGATIVE
Glucose, UA: NEGATIVE mg/dL
Hgb urine dipstick: NEGATIVE
Ketones, ur: NEGATIVE mg/dL
Nitrite: NEGATIVE
Protein, ur: NEGATIVE mg/dL
Specific Gravity, Urine: 1.01 (ref 1.005–1.030)
pH: 6 (ref 5.0–8.0)

## 2020-07-21 LAB — BASIC METABOLIC PANEL
Anion gap: 9 (ref 5–15)
BUN: 18 mg/dL (ref 8–23)
CO2: 29 mmol/L (ref 22–32)
Calcium: 9.1 mg/dL (ref 8.9–10.3)
Chloride: 102 mmol/L (ref 98–111)
Creatinine, Ser: 1.21 mg/dL — ABNORMAL HIGH (ref 0.44–1.00)
GFR, Estimated: 44 mL/min — ABNORMAL LOW (ref >60)
Glucose, Bld: 99 mg/dL (ref 70–99)
Potassium: 3.9 mmol/L (ref 3.5–5.1)
Sodium: 140 mmol/L (ref 135–145)

## 2020-07-21 LAB — CBC
HCT: 35.2 % — ABNORMAL LOW (ref 36.0–46.0)
Hemoglobin: 11.5 g/dL — ABNORMAL LOW (ref 12.0–15.0)
MCH: 27.2 pg (ref 26.0–34.0)
MCHC: 32.7 g/dL (ref 30.0–36.0)
MCV: 83.2 fL (ref 80.0–100.0)
Platelets: 352 10*3/uL (ref 150–400)
RBC: 4.23 MIL/uL (ref 3.87–5.11)
RDW: 16.4 % — ABNORMAL HIGH (ref 11.5–15.5)
WBC: 7.3 10*3/uL (ref 4.0–10.5)
nRBC: 0 % (ref 0.0–0.2)

## 2020-07-21 NOTE — ED Triage Notes (Signed)
Pt comes into the ED via EMS from home with c/o waking with dizziness with nausea. States she felt fine yesterday. Pt is a/ox4 on arrival  95%RA 179/89 CBG89 97.6 #20gLAC  4mg Zofran given PTA

## 2020-07-21 NOTE — ED Provider Notes (Signed)
[facility] Emergency Department Provider Note  ____________________________________________  Time seen: Approximately 9:49 AM  I have reviewed the triage vital signs and the nursing notes.   HISTORY  Chief Complaint Dizziness    HPI Kathy Pacheco is a 87 y.o. female with a history of hypertension who reports being in her usual state of health until waking up this morning when she felt a little bit dizzy.  Denies any pain, no headaches or vision change, no weakness or paresthesia.  She checked her blood pressure and found it to be about 242 systolic.  She took her morning amlodipine.  She is now feeling back to normal.  Symptoms were constant when present without aggravating or alleviating factors.  No recent illness or sick contacts.  She also reports intermittent issues with vertigo for which she takes Dramamine.  She has been eating and drinking normally.  She is ambulatory at home and keeping up with her usual activity.    Past Medical History:  Diagnosis Date   Anxiety    GERD (gastroesophageal reflux disease)    Hypertension    PONV (postoperative nausea and vomiting)      Patient Active Problem List   Diagnosis Date Noted   Chronic cough 12/17/2018   Syncope 01/07/2017   UTI (urinary tract infection) 01/07/2017   Absolute anemia 11/03/2014   Bilateral cataracts 11/03/2014   COPD, mild ([place]) 11/03/2014   Acute cystitis 11/03/2014   Eczema of external ear 11/03/2014   Esophagitis, reflux 11/03/2014   Anxiety, generalized 11/03/2014   Acid reflux 11/03/2014   Hypercholesteremia 11/03/2014   HLD (hyperlipidemia) 11/03/2014   BP (high blood pressure) 11/03/2014   Affective disorder, major 11/03/2014   Menopausal and perimenopausal disorder 11/03/2014   Obstructive apnea 11/03/2014   Awareness of heartbeats 11/03/2014   Hypercholesterolemia without hypertriglyceridemia 11/03/2014   Allergic rhinitis, seasonal 11/03/2014   H/O cataract  extraction 11/03/2014   Anxiety 12/16/2013   Apnea, sleep 12/16/2013     Past Surgical History:  Procedure Laterality Date   CATARACT EXTRACTION Bilateral    ETHMOIDECTOMY Bilateral 03/10/2016   Procedure: ETHMOIDECTOMY;  Surgeon: Beverly Gust, MD;  Location: [place];  Service: ENT;  Laterality: Bilateral;   FRONTAL SINUS EXPLORATION Bilateral 03/10/2016   Procedure: FRONTAL WITH TISSUE REMOVAL;  Surgeon: Beverly Gust, MD;  Location: [place];  Service: ENT;  Laterality: Bilateral;   HERNIA REPAIR     IMAGE GUIDED SINUS SURGERY Bilateral 03/10/2016   Procedure: IMAGE GUIDED SINUS SURGERY;  Surgeon: Beverly Gust, MD;  Location: [place];  Service: ENT;  Laterality: Bilateral;  Need Disk GAVE DISK TO CECE 1-25 KP   MAXILLARY ANTROSTOMY Bilateral 03/10/2016   Procedure: MAXILLARY ANTROSTOMY;  Surgeon: Beverly Gust, MD;  Location: [place];  Service: ENT;  Laterality: Bilateral;   NASAL TURBINATE REDUCTION Bilateral 03/10/2016   Procedure: TURBINATE REDUCTION/SUBMUCOSAL RESECTION;  Surgeon: Beverly Gust, MD;  Location: [place];  Service: ENT;  Laterality: Bilateral;   SEPTOPLASTY Bilateral 03/10/2016   Procedure: SEPTOPLASTY;  Surgeon: Beverly Gust, MD;  Location: [place];  Service: ENT;  Laterality: Bilateral;   SPHENOIDECTOMY Bilateral 03/10/2016   Procedure: Coralee Pesa WITH TISSUE REMOVAL;  Surgeon: Beverly Gust, MD;  Location: [place];  Service: ENT;  Laterality: Bilateral;     Prior to Admission medications   Medication Sig Start Date End Date Taking? Authorizing Provider  acetaminophen (TYLENOL) 500 MG tablet Take 500 mg by mouth every 6 (six) hours as  needed for mild pain.     [provider]  albuterol (VENTOLIN HFA) 108 (90 Base) MCG/ACT inhaler Inhale 2 puffs into the lungs every 6 (six) hours as needed for wheezing or shortness of breath. 12/17/18   Parrett, Tammy S, NP   amLODipine (NORVASC) 5 MG tablet TAKE 1 TABLET (5 MG TOTAL) BY MOUTH DAILY. 05/07/20   Jerrol Banana., MD  cetirizine (ZYRTEC ALLERGY) 10 MG tablet Take 1 tablet (10 mg total) by mouth daily. 08/22/17   Flora Lipps, MD  famotidine (PEPCID) 40 MG tablet Take 40 mg by mouth at bedtime. 05/08/20   [provider]  fluticasone (FLONASE) 50 MCG/ACT nasal spray Place 1 spray into both nostrils daily as needed for allergies or rhinitis. 06/02/20   Enzo Bi, MD  ondansetron (ZOFRAN ODT) 4 MG disintegrating tablet Allow 1-2 tablets to dissolve in your mouth every 8 hours as needed for nausea/vomiting 12/15/19   Hinda Kehr, MD  pantoprazole (PROTONIX) 40 MG tablet 40 mg 2 (two) times daily. 30 minutes before meals 10/17/19   [provider]     Allergies Ciprofloxacin   Family History  Problem Relation Age of Onset   Epilepsy Mother    Osteoporosis Mother    COPD Father    Hypertension Father    CVA Father    Allergies Father    Lung cancer Father    Emphysema Father    Cancer Sister        breast   Breast cancer Sister 7   CVA Brother    COPD Brother    Heart disease Brother    Arthritis Brother    CVA Brother    Cancer Brother        cancer   Healthy Brother    Cancer Maternal Grandmother    Hypertension Maternal Grandmother    Healthy Brother    Osteoporosis Sister    COPD Sister     Social History Social History   Tobacco Use   Smoking status: Never   Smokeless tobacco: Never  Vaping Use   Vaping Use: Never used  Substance Use Topics   Alcohol use: No    Alcohol/week: 0.0 standard drinks   Drug use: No    Review of Systems  Constitutional:   No fever or chills.  ENT:   No sore throat. No rhinorrhea. Cardiovascular:   No chest pain or syncope. Respiratory:   No dyspnea or cough. Gastrointestinal:   Negative for abdominal pain, vomiting and diarrhea.  Musculoskeletal:   Negative for focal pain or swelling All other systems reviewed and  are negative except as documented above in ROS and HPI.  ____________________________________________   PHYSICAL EXAM:  VITAL SIGNS: ED Triage Vitals  Enc Vitals Group     BP 07/21/20 0705 (!) 161/79     Pulse Rate 07/21/20 0705 81     Resp 07/21/20 0705 17     Temp 07/21/20 0705 97.9 F (36.6 C)     Temp Source 07/21/20 0705 Oral     SpO2 07/21/20 0705 98 %     Weight 07/21/20 0704 135 lb (61.2 kg)     Height 07/21/20 0704 5\' 2"  (1.575 m)     Head Circumference --      Peak Flow --      Pain Score 07/21/20 0704 0     Pain Loc --      Pain Edu? --      Excl. in [place]? --  Vital signs reviewed, nursing assessments reviewed.   Constitutional:   Alert and oriented. Non-toxic appearance. Eyes:   Conjunctivae are normal. EOMI. PERRL. ENT      Head:   Normocephalic and atraumatic.      Nose:   Wearing a mask.      Mouth/Throat:   Wearing a mask.      Neck:   No meningismus. Full ROM. Hematological/Lymphatic/Immunilogical:   No cervical lymphadenopathy. Cardiovascular:   RRR. Symmetric bilateral radial and DP pulses.  No murmurs. Cap refill less than 2 seconds. Respiratory:   Normal respiratory effort without tachypnea/retractions. Breath sounds are clear and equal bilaterally. No wheezes/rales/rhonchi. Gastrointestinal:   Soft and nontender. Non distended. There is no CVA tenderness.  No rebound, rigidity, or guarding. Genitourinary:   deferred Musculoskeletal:   Normal range of motion in all extremities. No joint effusions.  No lower extremity tenderness.  No edema. Neurologic:   Normal speech and language.  Motor grossly intact. No acute focal neurologic deficits are appreciated.  Skin:    Skin is warm, dry and intact. No rash noted.  No petechiae, purpura, or bullae.  ____________________________________________    LABS (pertinent positives/negatives) (all labs ordered are listed, but only abnormal results are displayed) Labs Reviewed  BASIC METABOLIC PANEL -  Abnormal; Notable for the following components:      Result Value   Creatinine, Ser 1.21 (*)    GFR, Estimated 44 (*)    All other components within normal limits  CBC - Abnormal; Notable for the following components:   Hemoglobin 11.5 (*)    HCT 35.2 (*)    RDW 16.4 (*)    All other components within normal limits  URINALYSIS, COMPLETE (UACMP) WITH MICROSCOPIC - Abnormal; Notable for the following components:   Color, Urine STRAW (*)    APPearance CLEAR (*)    Leukocytes,Ua TRACE (*)    All other components within normal limits  CBG MONITORING, ED   ____________________________________________   EKG  Interpreted by me  Date: 07/21/2020  Rate: 81  Rhythm: normal sinus rhythm  QRS Axis: normal  Intervals: normal  ST/T Wave abnormalities: normal  Conduction Disutrbances: none  Narrative Interpretation: unremarkable     ____________________________________________    RADIOLOGY  No results found.  ____________________________________________   PROCEDURES Procedures  ____________________________________________  DIFFERENTIAL DIAGNOSIS   Vertigo, symptomatic hypertension, dehydration, electrolyte abnormality, cerebral hemorrhage  CLINICAL IMPRESSION / ASSESSMENT AND PLAN / ED COURSE  Medications ordered in the ED: Medications - No data to display  Pertinent labs & imaging results that were available during my care of the patient were reviewed by me and considered in my medical decision making (see chart for details).  Kathy Pacheco was evaluated in Emergency Department on 07/21/2020 for the symptoms described in the history of present illness. She was evaluated in the context of the global COVID-19 pandemic, which necessitated consideration that the patient might be at risk for infection with the SARS-CoV-2 virus that causes COVID-19. Institutional protocols and algorithms that pertain to the evaluation of patients at risk for COVID-19 are in a state of rapid  change based on information released by regulatory bodies including the CDC and federal and state organizations. These policies and algorithms were followed during the patient's care in the ED.   Patient presents with episode of dizziness with waking up this morning, found her blood pressure to be severely elevated.  No focal neuro symptoms or deficits at this time.  She is now asymptomatic  and back to normal, drinking coffee and sitting upright.  Vital signs are unremarkable, blood pressure currently 160/80.  Labs are unremarkable.  No sign of UTI.  Will check CT head after which she will be stable for discharge home if there are no acute findings.  Recommend follow-up with her primary care doctor within a week for blood pressure recheck.      ____________________________________________   FINAL CLINICAL IMPRESSION(S) / ED DIAGNOSES    Final diagnoses:  Hypertension, unspecified type  Dizziness     ED Discharge Orders     None       Portions of this note were generated with dragon dictation software. Dictation errors may occur despite best attempts at proofreading.   Carrie Mew, MD 07/21/20 [phone]

## 2020-07-21 NOTE — ED Notes (Signed)
Patient back from CT.

## 2020-08-03 ENCOUNTER — Other Ambulatory Visit: Payer: Self-pay | Admitting: Family Medicine

## 2020-08-17 DIAGNOSIS — R110 Nausea: Secondary | ICD-10-CM | POA: Diagnosis not present

## 2020-08-17 DIAGNOSIS — R1310 Dysphagia, unspecified: Secondary | ICD-10-CM | POA: Diagnosis not present

## 2020-08-17 DIAGNOSIS — K2100 Gastro-esophageal reflux disease with esophagitis, without bleeding: Secondary | ICD-10-CM | POA: Diagnosis not present

## 2020-08-17 DIAGNOSIS — K219 Gastro-esophageal reflux disease without esophagitis: Secondary | ICD-10-CM | POA: Diagnosis not present

## 2020-08-25 NOTE — Progress Notes (Signed)
Established patient visit   Patient: Kathy Pacheco   DOB: 1932/01/27   87 y.o. Female  MRN: [id number] Visit Date: 08/26/2020  Today's healthcare provider: [name], MD   Chief Complaint  Patient presents with   Annual Exam   Subjective    HPI  Patient comes in today for follow-up of chronic medical problems.  Her daughter talks to me before the visit and states she is worried about her lack of cooperation and her early cognitive impairment.  She evidently does not take her medications as prescribed and wants to live independently on her own terms.  She is not violent or have other behavioral issues.  She is still driving without difficulty or incident or accident. Patient has no complaints today. She evidently complains to her daughter about reflux symptoms but she is not taking her reflux medicines as prescribed.  Patient states her appetite is good and her weight is stable. Patient had AWV with [facility] on 02/25/2020.  Hypertension, follow-up  BP Readings from Last 3 Encounters:  08/26/20 (!) 160/75  07/21/20 (!) 137/56  06/02/20 122/71   Wt Readings from Last 3 Encounters:  08/26/20 130 lb (59 kg)  07/21/20 135 lb (61.2 kg)  06/02/20 129 lb 3.2 oz (58.6 kg)     She was last seen for hypertension 6 months ago.  BP at that visit was 136/52. Management since that visit includes; Controlled. She reports poor compliance with treatment. She is not having side effects.  She is not exercising. She is not adherent to low salt diet.   Outside blood pressures are not being checked.  She does not smoke.  Use of agents associated with hypertension: none.   --------------------------------------------------------------------------------------------------- Depression, Follow-up  She  was last seen for this 6 months ago. Changes made at last visit include; Stable.   She reports poor compliance with treatment. She is not having side effects.   She reports poor  tolerance of treatment.   Depression screen [facility] 2/9 08/26/2020 02/18/2020 06/09/2019  Decreased Interest 0 0 1  Down, Depressed, Hopeless 0 0 0  PHQ - 2 Score 0 0 1  Altered sleeping 0 - 1  Tired, decreased energy 0 - 1  Change in appetite 0 - 0  Feeling bad or failure about yourself  0 - 0  Trouble concentrating 0 - 0  Moving slowly or fidgety/restless 0 - 0  Suicidal thoughts 0 - 0  PHQ-9 Score 0 - 3  Difficult doing work/chores Not difficult at all - Not difficult at all          Medications: Outpatient Medications Prior to Visit  Medication Sig   acetaminophen (TYLENOL) 500 MG tablet Take 500 mg by mouth every 6 (six) hours as needed for mild pain.    albuterol (VENTOLIN HFA) 108 (90 Base) MCG/ACT inhaler Inhale 2 puffs into the lungs every 6 (six) hours as needed for wheezing or shortness of breath.   amLODipine (NORVASC) 5 MG tablet TAKE 1 TABLET (5 MG TOTAL) BY MOUTH DAILY.   cetirizine (ZYRTEC ALLERGY) 10 MG tablet Take 1 tablet (10 mg total) by mouth daily.   famotidine (PEPCID) 40 MG tablet Take 40 mg by mouth at bedtime.   fluticasone (FLONASE) 50 MCG/ACT nasal spray Place 1 spray into both nostrils daily as needed for allergies or rhinitis.   ondansetron (ZOFRAN ODT) 4 MG disintegrating tablet Allow 1-2 tablets to dissolve in your mouth every 8 hours as needed for  nausea/vomiting   pantoprazole (PROTONIX) 40 MG tablet 40 mg 2 (two) times daily. 30 minutes before meals   No facility-administered medications prior to visit.    Review of Systems  All other systems reviewed and are negative.      Objective    BP (!) 160/75   Pulse 75   Temp 98.2 F (36.8 C)   Resp 16   Wt 130 lb (59 kg)   BMI 23.78 kg/m  BP Readings from Last 3 Encounters:  08/26/20 (!) 160/75  07/21/20 (!) 137/56  06/02/20 122/71   Wt Readings from Last 3 Encounters:  08/26/20 130 lb (59 kg)  07/21/20 135 lb (61.2 kg)  06/02/20 129 lb 3.2 oz (58.6 kg)       Physical Exam Vitals  reviewed.  Constitutional:      Appearance: Normal appearance.  HENT:     Head: Normocephalic and atraumatic.     Right Ear: External ear normal.     Left Ear: External ear normal.  Eyes:     General: No scleral icterus.    Conjunctiva/sclera: Conjunctivae normal.  Cardiovascular:     Rate and Rhythm: Normal rate and regular rhythm.     Pulses: Normal pulses.     Heart sounds: Normal heart sounds.  Pulmonary:     Effort: Pulmonary effort is normal.     Breath sounds: Normal breath sounds.  Musculoskeletal:     Right lower leg: No edema.     Left lower leg: No edema.  Skin:    General: Skin is warm and dry.  Neurological:     General: No focal deficit present.     Mental Status: She is alert and oriented to person, place, and time.  Psychiatric:        Mood and Affect: Mood normal.        Behavior: Behavior normal.        Thought Content: Thought content normal.        Judgment: Judgment normal.      No results found for any visits on 08/26/20.  Assessment & Plan     1. Primary hypertension Not well controlled today but patient is evidently not make taking her medications.  Have strongly encouraged her to take her medications which is amlodipine 5 mg daily - Comprehensive Metabolic Panel (CMET)  2. Hypercholesteremia Follow and treat as appropriate.  If LDL goes up may need treatment although at 88 I am not sure how beneficial it would be. - Lipid panel - TSH  3. Anemia, unspecified type  - CBC w/Diff/Platelet  4. Chronic kidney disease, unspecified CKD stage Follow lab work  5. MCI (mild cognitive impairment) MMSE on next visit.  The only behavioral issue with this at this time is her unwillingness to take medications as prescribed. - AMB Referral to [facility].  GERD Encouraged to take pantoprazole and famotidine as prescribed.  Has GI follow-up.  No follow-ups on file.      I, [name], MD, have reviewed all documentation for  this visit. The documentation on 08/31/20 for the exam, diagnosis, procedures, and orders are all accurate and complete.    Jakylah Bassinger Cranford Mon, MD  [facility] [phone] (phone) [phone] (fax)  [place]

## 2020-08-26 ENCOUNTER — Encounter: Payer: Self-pay | Admitting: Family Medicine

## 2020-08-26 ENCOUNTER — Other Ambulatory Visit: Payer: Self-pay

## 2020-08-26 ENCOUNTER — Ambulatory Visit (INDEPENDENT_AMBULATORY_CARE_PROVIDER_SITE_OTHER): Payer: Medicare Other | Admitting: Family Medicine

## 2020-08-26 VITALS — BP 160/75 | HR 75 | Temp 98.2°F | Resp 16 | Wt 130.0 lb

## 2020-08-26 DIAGNOSIS — K219 Gastro-esophageal reflux disease without esophagitis: Secondary | ICD-10-CM

## 2020-08-26 DIAGNOSIS — N189 Chronic kidney disease, unspecified: Secondary | ICD-10-CM

## 2020-08-26 DIAGNOSIS — G3184 Mild cognitive impairment, so stated: Secondary | ICD-10-CM

## 2020-08-26 DIAGNOSIS — E7800 Pure hypercholesterolemia, unspecified: Secondary | ICD-10-CM

## 2020-08-26 DIAGNOSIS — I10 Essential (primary) hypertension: Secondary | ICD-10-CM | POA: Diagnosis not present

## 2020-08-26 DIAGNOSIS — D649 Anemia, unspecified: Secondary | ICD-10-CM | POA: Diagnosis not present

## 2020-08-26 NOTE — Patient Instructions (Signed)
Take all medications as prescribed.   °

## 2020-08-27 ENCOUNTER — Telehealth: Payer: Self-pay | Admitting: *Deleted

## 2020-08-27 LAB — COMPREHENSIVE METABOLIC PANEL
ALT: 5 IU/L (ref 0–32)
AST: 7 IU/L (ref 0–40)
Albumin/Globulin Ratio: 1.2 (ref 1.2–2.2)
Albumin: 3.9 g/dL (ref 3.6–4.6)
Alkaline Phosphatase: 106 IU/L (ref 44–121)
BUN/Creatinine Ratio: 15 (ref 12–28)
BUN: 18 mg/dL (ref 8–27)
Bilirubin Total: 0.4 mg/dL (ref 0.0–1.2)
CO2: 24 mmol/L (ref 20–29)
Calcium: 9.7 mg/dL (ref 8.7–10.3)
Chloride: 101 mmol/L (ref 96–106)
Creatinine, Ser: 1.22 mg/dL — ABNORMAL HIGH (ref 0.57–1.00)
Globulin, Total: 3.3 g/dL (ref 1.5–4.5)
Glucose: 88 mg/dL (ref 65–99)
Potassium: 4.9 mmol/L (ref 3.5–5.2)
Sodium: 139 mmol/L (ref 134–144)
Total Protein: 7.2 g/dL (ref 6.0–8.5)
eGFR: 43 mL/min/{1.73_m2} — ABNORMAL LOW (ref >59)

## 2020-08-27 LAB — CBC WITH DIFFERENTIAL/PLATELET
Basophils Absolute: 0.1 10*3/uL (ref 0.0–0.2)
Basos: 1 %
EOS (ABSOLUTE): 0.7 10*3/uL — ABNORMAL HIGH (ref 0.0–0.4)
Eos: 8 %
Hematocrit: 34.8 % (ref 34.0–46.6)
Hemoglobin: 11.6 g/dL (ref 11.1–15.9)
Immature Grans (Abs): 0 10*3/uL (ref 0.0–0.1)
Immature Granulocytes: 0 %
Lymphocytes Absolute: 1.9 10*3/uL (ref 0.7–3.1)
Lymphs: 22 %
MCH: 27.7 pg (ref 26.6–33.0)
MCHC: 33.3 g/dL (ref 31.5–35.7)
MCV: 83 fL (ref 79–97)
Monocytes Absolute: 1 10*3/uL — ABNORMAL HIGH (ref 0.1–0.9)
Monocytes: 12 %
Neutrophils Absolute: 4.9 10*3/uL (ref 1.4–7.0)
Neutrophils: 57 %
Platelets: 358 10*3/uL (ref 150–450)
RBC: 4.19 x10E6/uL (ref 3.77–5.28)
RDW: 15 % (ref 11.7–15.4)
WBC: 8.7 10*3/uL (ref 3.4–10.8)

## 2020-08-27 LAB — LIPID PANEL
Chol/HDL Ratio: 5.8 ratio — ABNORMAL HIGH (ref 0.0–4.4)
Cholesterol, Total: 284 mg/dL — ABNORMAL HIGH (ref 100–199)
HDL: 49 mg/dL (ref >39)
LDL Chol Calc (NIH): 211 mg/dL — ABNORMAL HIGH (ref 0–99)
Triglycerides: 132 mg/dL (ref 0–149)
VLDL Cholesterol Cal: 24 mg/dL (ref 5–40)

## 2020-08-27 LAB — TSH: TSH: 1.53 u[IU]/mL (ref 0.450–4.500)

## 2020-08-27 NOTE — Telephone Encounter (Signed)
Kathy Pacheco was calling back and wanted to speak with Staci.

## 2020-08-27 NOTE — Chronic Care Management (AMB) (Signed)
  Chronic Care Management   Outreach Note  [id number] Name: SPENCER SCHIFANO MRN: [id number] DOB: [id number]  MIRINDA WELCHER is a 87 y.o. year old female who is a primary care patient of Jerrol Banana., MD. I reached out to [name] by phone today in response to a referral sent by Ms. Cathlean Sauer Haseley's PCP Jerrol Banana., MD     An unsuccessful telephone outreach was attempted today. The patient was referred to the case management team for assistance with care management and care coordination.   Follow Up Plan: A HIPAA compliant phone message was left for the patient providing contact information and requesting a return call.  If patient returns call to provider office, please advise to call Embedded Care Management Care Guide Vyom Brass at [place], [place] [facility]  Direct Dial: [phone]

## 2020-08-30 ENCOUNTER — Telehealth: Payer: Self-pay

## 2020-08-30 MED ORDER — ROSUVASTATIN CALCIUM 10 MG PO TABS: 10.0000 mg | ORAL_TABLET | Freq: Every day | ORAL | 3 refills | Status: DC

## 2020-08-30 NOTE — Chronic Care Management (AMB) (Signed)
  Chronic Care Management   Note  [id number] Name: JARYN HOCUTT MRN: [id number] DOB: [birth year]  MARIONETTE MESKILL is a 87 y.o. year old female who is a primary care patient of Jerrol Banana., MD. I reached out to [name] by phone today in response to a referral sent by Ms. Cathlean Sauer Cornelio's PCP Jerrol Banana., MD     Ms. Polimeni was given information about Chronic Care Management services today including:  CCM service includes personalized support from designated clinical staff supervised by her physician, including individualized plan of care and coordination with other care providers 24/7 contact phone numbers for assistance for urgent and routine care needs. Service will only be billed when office clinical staff spend 20 minutes or more in a month to coordinate care. Only one practitioner may furnish and bill the service in a calendar month. The patient may stop CCM services at any time (effective at the end of the month) by phone call to the office staff. The patient will be responsible for cost sharing (co-pay) of up to 20% of the service fee (after annual deductible is met).  Patient agreed to services and verbal consent obtained.   Follow up plan: Telephone appointment with care management team member scheduled for: 09/07/2020  Julian Hy, [place], [place] [facility]  Direct Dial: [phone]

## 2020-08-30 NOTE — Telephone Encounter (Signed)
Copied from [place] [phone]. Topic: General - Other >> Aug 30, 2020  8:43 AM Ivar Drape wrote: Reason for CRM: Patient's daughter, Darnelle Catalan is returning a call to [facility] to discuss her mother's decision of treatment.

## 2020-08-30 NOTE — Telephone Encounter (Signed)
Patient is not wanting to take Metamucil. Will call in Crestor '10mg'$  into the pharmacy. They have also been working with CCM to help with medication management.

## 2020-09-07 ENCOUNTER — Ambulatory Visit (INDEPENDENT_AMBULATORY_CARE_PROVIDER_SITE_OTHER): Payer: Medicare Other | Admitting: *Deleted

## 2020-09-07 DIAGNOSIS — G3184 Mild cognitive impairment, so stated: Secondary | ICD-10-CM

## 2020-09-07 DIAGNOSIS — I10 Essential (primary) hypertension: Secondary | ICD-10-CM

## 2020-09-07 DIAGNOSIS — F339 Major depressive disorder, recurrent, unspecified: Secondary | ICD-10-CM

## 2020-09-08 MED ORDER — PHENYLEPHRINE HCL (PRESSORS) 10 MG/ML IV SOLN
INTRAVENOUS | Status: AC
  Filled 2020-09-08: qty 1

## 2020-09-08 MED ORDER — PROPOFOL 500 MG/50ML IV EMUL
INTRAVENOUS | Status: AC
  Filled 2020-09-08: qty 50

## 2020-09-08 NOTE — Chronic Care Management (AMB) (Signed)
Chronic Care Management    Clinical Social Work Note  03/15/8339 Name: Kathy Pacheco MRN: [id number] DOB: [birth year]  Kathy Pacheco is a 87 y.o. year old female who is a primary care patient of Jerrol Banana., MD. The CCM team was consulted to assist the patient with chronic disease management and/or care coordination needs related to: Caregiver Stress.   Collaboration with patient's daughter   for initial visit in response to provider referral for social work chronic care management and care coordination services.   Consent to Services:  The patient was given the following information about Chronic Care Management services today, agreed to services, and gave verbal consent: 1. CCM service includes personalized support from designated clinical staff supervised by the primary care provider, including individualized plan of care and coordination with other care providers 2. 24/7 contact phone numbers for assistance for urgent and routine care needs. 3. Service will only be billed when office clinical staff spend 20 minutes or more in a month to coordinate care. 4. Only one practitioner may furnish and bill the service in a calendar month. 5.The patient may stop CCM services at any time (effective at the end of the month) by phone call to the office staff. 6. The patient will be responsible for cost sharing (co-pay) of up to 20% of the service fee (after annual deductible is met). Patient agreed to services and consent obtained.  Patient agreed to services and consent obtained.   Assessment: Review of patient past medical history, allergies, medications, and health status, including review of relevant consultants reports was performed today as part of a comprehensive evaluation and provision of chronic care management and care coordination services.     SDOH (Social Determinants of Health) assessments and interventions performed:    Advanced Directives Status: Not addressed in this  encounter.  CCM Care Plan  Allergies  Allergen Reactions   Ciprofloxacin Itching    Outpatient Encounter Medications as of 09/07/2020  Medication Sig   acetaminophen (TYLENOL) 500 MG tablet Take 500 mg by mouth every 6 (six) hours as needed for mild pain.    albuterol (VENTOLIN HFA) 108 (90 Base) MCG/ACT inhaler Inhale 2 puffs into the lungs every 6 (six) hours as needed for wheezing or shortness of breath.   amLODipine (NORVASC) 5 MG tablet TAKE 1 TABLET (5 MG TOTAL) BY MOUTH DAILY.   cetirizine (ZYRTEC ALLERGY) 10 MG tablet Take 1 tablet (10 mg total) by mouth daily.   famotidine (PEPCID) 40 MG tablet Take 40 mg by mouth at bedtime.   fluticasone (FLONASE) 50 MCG/ACT nasal spray Place 1 spray into both nostrils daily as needed for allergies or rhinitis.   ondansetron (ZOFRAN ODT) 4 MG disintegrating tablet Allow 1-2 tablets to dissolve in your mouth every 8 hours as needed for nausea/vomiting   pantoprazole (PROTONIX) 40 MG tablet 40 mg 2 (two) times daily. 30 minutes before meals   rosuvastatin (CRESTOR) 10 MG tablet Take 1 tablet (10 mg total) by mouth daily.   No facility-administered encounter medications on file as of 09/07/2020.    Patient Active Problem List   Diagnosis Date Noted   Chronic cough 12/17/2018   Syncope 01/07/2017   UTI (urinary tract infection) 01/07/2017   Absolute anemia 11/03/2014   Bilateral cataracts 11/03/2014   COPD, mild ([place]) 11/03/2014   Acute cystitis 11/03/2014   Eczema of external ear 11/03/2014   Esophagitis, reflux 11/03/2014   Anxiety, generalized 11/03/2014   Acid reflux 11/03/2014  Hypercholesteremia 11/03/2014   HLD (hyperlipidemia) 11/03/2014   BP (high blood pressure) 11/03/2014   Affective disorder, major 11/03/2014   Menopausal and perimenopausal disorder 11/03/2014   Obstructive apnea 11/03/2014   Awareness of heartbeats 11/03/2014   Hypercholesterolemia without hypertriglyceridemia 11/03/2014   Allergic rhinitis, seasonal  11/03/2014   H/O cataract extraction 11/03/2014   Anxiety 12/16/2013   Apnea, sleep 12/16/2013    Conditions to be addressed/monitored:  Mild cognitive impairment ; Cognitive Deficits  Care Plan : General Social Work (Adult)  Updates made by Vern Claude, LCSW since 09/08/2020 12:00 AM     Problem: Caregiver Stress      Goal: Caregiver Coping Optimized   Start Date: 09/07/2020  Expected End Date: 03/10/2021  Priority: Medium  Note:   Current Barriers:  Level of care concerns and ADL IADL limitations Suicidal Ideation/Homicidal Ideation: No  Clinical Social Work Goal(s):  Over the next 90 days, patient will work with SW bi-weekly by telephone or in person to reduce or manage need for in home support  Interventions: Patient's daughter interviewed and appropriate assessments performed Patient's daughter discussed concern that patient's lives alone, stills drives at times and has a sister in Sports coach that lives next do-they have historically supported each other however per daughter more oversight is needed  Patient's daughter expressed her main concern being that patient is non-adherent with her medications, despite having a pill box remainder Adherence options explored, Day Programs discussed as well as activities at the [facility], however per patient's daughter patient will decline Active listening / Reflection utilized   Emotional Supportive Provided Caregiver stress acknowledged   Patient Self Care Activities:  Strong family or social support  Patient Coping Strengths:  Family  Patient Self Care Deficits:  Unable to self administer medications as prescribed  Initial goal documentation     Task: Recognize and Manage Caregiver Stress        Follow Up Plan: SW will follow up with patient by phone over the next 14 business days       [place], [place] Worker  [facility] Care Management [phone]

## 2020-09-08 NOTE — Patient Instructions (Signed)
Visit Information  PATIENT GOALS:   Goals Addressed             This Visit's Progress    Find Help in My Community       Timeframe:  Long-Range Goal Priority:  Medium Start Date:   09/07/20                          Expected End Date:  03/10/21                     Follow Up Date 09/14/20    - follow-up on any referrals for help I am given - think ahead to make sure my need does not become an emergency - have a back-up plan    Why is this important?   Knowing how and where to find help for yourself or family in your neighborhood and community is an important skill.  You will want to take some steps to learn how.    Notes:         Ms. Adamek was given information about Care Management services by the embedded care coordination team including:  Care Management services include personalized support from designated clinical staff supervised by her physician, including individualized plan of care and coordination with other care providers 24/7 contact phone numbers for assistance for urgent and routine care needs. The patient may stop CCM services at any time (effective at the end of the month) by phone call to the office staff.  Patient agreed to services and verbal consent obtained.   The patient verbalized understanding of instructions, educational materials, and care plan provided today and declined offer to receive copy of patient instructions, educational materials, and care plan.   Telephone follow up appointment with care management team member scheduled for:09/16/20   Byanca Kasper, [place] Worker  [facility] Care [facility] [phone]

## 2020-09-09 ENCOUNTER — Encounter: Payer: Self-pay | Admitting: General Surgery

## 2020-09-10 ENCOUNTER — Encounter: Payer: Self-pay | Admitting: General Surgery

## 2020-09-10 ENCOUNTER — Ambulatory Visit
Admission: RE | Admit: 2020-09-10 | Discharge: 2020-09-10 | Disposition: A | Discharge status: Home / Self Care | Payer: Medicare Other | Attending: General Surgery | Admitting: General Surgery

## 2020-09-10 ENCOUNTER — Encounter
Admission: RE | Disposition: A | Discharge status: Home / Self Care | Payer: Self-pay | Source: Home / Self Care | Attending: General Surgery

## 2020-09-10 ENCOUNTER — Ambulatory Visit: Payer: Medicare Other | Admitting: Anesthesiology

## 2020-09-10 DIAGNOSIS — K2970 Gastritis, unspecified, without bleeding: Secondary | ICD-10-CM | POA: Diagnosis not present

## 2020-09-10 DIAGNOSIS — K317 Polyp of stomach and duodenum: Secondary | ICD-10-CM | POA: Insufficient documentation

## 2020-09-10 DIAGNOSIS — Z7982 Long term (current) use of aspirin: Secondary | ICD-10-CM | POA: Insufficient documentation

## 2020-09-10 DIAGNOSIS — K219 Gastro-esophageal reflux disease without esophagitis: Secondary | ICD-10-CM | POA: Diagnosis not present

## 2020-09-10 DIAGNOSIS — Z7951 Long term (current) use of inhaled steroids: Secondary | ICD-10-CM | POA: Diagnosis not present

## 2020-09-10 DIAGNOSIS — Z79899 Other long term (current) drug therapy: Secondary | ICD-10-CM | POA: Insufficient documentation

## 2020-09-10 DIAGNOSIS — R1310 Dysphagia, unspecified: Secondary | ICD-10-CM | POA: Diagnosis not present

## 2020-09-10 DIAGNOSIS — K3189 Other diseases of stomach and duodenum: Secondary | ICD-10-CM | POA: Diagnosis not present

## 2020-09-10 DIAGNOSIS — K2950 Unspecified chronic gastritis without bleeding: Secondary | ICD-10-CM | POA: Insufficient documentation

## 2020-09-10 DIAGNOSIS — Z881 Allergy status to other antibiotic agents status: Secondary | ICD-10-CM | POA: Insufficient documentation

## 2020-09-10 DIAGNOSIS — R110 Nausea: Secondary | ICD-10-CM | POA: Insufficient documentation

## 2020-09-10 HISTORY — DX: Hyperlipidemia, unspecified: E78.5

## 2020-09-10 HISTORY — DX: Depression, unspecified: F32.A

## 2020-09-10 HISTORY — DX: Sleep apnea, unspecified: G47.30

## 2020-09-10 HISTORY — DX: Diverticulosis of intestine, part unspecified, without perforation or abscess without bleeding: K57.90

## 2020-09-10 HISTORY — PX: ESOPHAGOGASTRODUODENOSCOPY (EGD) WITH PROPOFOL: SHX5813

## 2020-09-10 HISTORY — DX: Peripheral vascular disease, unspecified: I73.9

## 2020-09-10 SURGERY — ESOPHAGOGASTRODUODENOSCOPY (EGD) WITH PROPOFOL
Anesthesia: General

## 2020-09-10 MED ORDER — PROPOFOL 500 MG/50ML IV EMUL
INTRAVENOUS | Status: DC | PRN
  Administered 2020-09-10: 150 ug/kg/min via INTRAVENOUS

## 2020-09-10 MED ORDER — LIDOCAINE HCL (CARDIAC) PF 100 MG/5ML IV SOSY
PREFILLED_SYRINGE | INTRAVENOUS | Status: DC | PRN
  Administered 2020-09-10: 50 mg via INTRAVENOUS

## 2020-09-10 MED ORDER — PROPOFOL 10 MG/ML IV BOLUS
INTRAVENOUS | Status: DC | PRN
Start: 2020-09-10 — End: 2020-09-10
  Administered 2020-09-10: 60 mg via INTRAVENOUS

## 2020-09-10 MED ORDER — SODIUM CHLORIDE 0.9 % IV SOLN: INTRAVENOUS | Status: DC

## 2020-09-10 NOTE — H&P (Signed)
Kathy Pacheco [id number] 1932/02/02     HPI:  87 y/o seen mid-July in GI clinic with nausea and modest dysphagia.  The former has improved with RX for Prilosec.  Occasional sensation of food sticking in the neck area, then slowly passing distally. Rare need to regurgitate food.  For EGD.   Medications Prior to Admission  Medication Sig Dispense Refill Last Dose   amLODipine (NORVASC) 5 MG tablet TAKE 1 TABLET (5 MG TOTAL) BY MOUTH DAILY. 90 tablet 0 09/09/2020   aspirin EC 81 MG tablet Take 81 mg by mouth daily. Swallow whole.      busPIRone (BUSPAR) 30 MG tablet Take 30 mg by mouth in the morning and at bedtime.      calcium carbonate (OS-CAL) 600 MG TABS tablet Take 600 mg by mouth 2 (two) times daily with a meal.      cetirizine (ZYRTEC ALLERGY) 10 MG tablet Take 1 tablet (10 mg total) by mouth daily. 30 tablet 6 09/09/2020   famotidine (PEPCID) 40 MG tablet Take 40 mg by mouth at bedtime.   09/09/2020   fluticasone-salmeterol (ADVAIR) 500-50 MCG/ACT AEPB Inhale 1 puff into the lungs as needed.      meclizine (ANTIVERT) 25 MG tablet Take 25 mg by mouth 3 (three) times daily as needed for dizziness.      rosuvastatin (CRESTOR) 10 MG tablet Take 1 tablet (10 mg total) by mouth daily. 90 tablet 3 09/09/2020   acetaminophen (TYLENOL) 500 MG tablet Take 500 mg by mouth every 6 (six) hours as needed for mild pain.       albuterol (VENTOLIN HFA) 108 (90 Base) MCG/ACT inhaler Inhale 2 puffs into the lungs every 6 (six) hours as needed for wheezing or shortness of breath. 18 g 6    fluticasone (FLONASE) 50 MCG/ACT nasal spray Place 1 spray into both nostrils daily as needed for allergies or rhinitis.  9    ondansetron (ZOFRAN ODT) 4 MG disintegrating tablet Allow 1-2 tablets to dissolve in your mouth every 8 hours as needed for nausea/vomiting 30 tablet 0    pantoprazole (PROTONIX) 40 MG tablet 40 mg 2 (two) times daily. 30 minutes before meals      Allergies  Allergen Reactions   Ciprofloxacin Itching    Past Medical History:  Diagnosis Date   Anxiety    Depression    Diverticulosis    GERD (gastroesophageal reflux disease)    Hyperlipidemia    Hypertension    PONV (postoperative nausea and vomiting)    PVD (peripheral vascular disease) ([place])    Sleep apnea    Past Surgical History:  Procedure Laterality Date   CATARACT EXTRACTION Bilateral    COLONOSCOPY     ETHMOIDECTOMY Bilateral 03/10/2016   Procedure: ETHMOIDECTOMY;  Surgeon: Beverly Gust, MD;  Location: [place];  Service: ENT;  Laterality: Bilateral;   FRONTAL SINUS EXPLORATION Bilateral 03/10/2016   Procedure: FRONTAL WITH TISSUE REMOVAL;  Surgeon: Beverly Gust, MD;  Location: [place];  Service: ENT;  Laterality: Bilateral;   HERNIA REPAIR     IMAGE GUIDED SINUS SURGERY Bilateral 03/10/2016   Procedure: IMAGE GUIDED SINUS SURGERY;  Surgeon: Beverly Gust, MD;  Location: [place];  Service: ENT;  Laterality: Bilateral;  Need Disk GAVE DISK TO CECE 1-25 KP   MAXILLARY ANTROSTOMY Bilateral 03/10/2016   Procedure: MAXILLARY ANTROSTOMY;  Surgeon: Beverly Gust, MD;  Location: [place];  Service: ENT;  Laterality: Bilateral;   NASAL TURBINATE REDUCTION Bilateral 03/10/2016  Procedure: TURBINATE REDUCTION/SUBMUCOSAL RESECTION;  Surgeon: Beverly Gust, MD;  Location: [place];  Service: ENT;  Laterality: Bilateral;   SEPTOPLASTY Bilateral 03/10/2016   Procedure: SEPTOPLASTY;  Surgeon: Beverly Gust, MD;  Location: [place];  Service: ENT;  Laterality: Bilateral;   SPHENOIDECTOMY Bilateral 03/10/2016   Procedure: Coralee Pesa WITH TISSUE REMOVAL;  Surgeon: Beverly Gust, MD;  Location: [place];  Service: ENT;  Laterality: Bilateral;   Social History   Socioeconomic History   Marital status: Widowed    Spouse name: Not on file   Number of children: 1   Years of education: Not on file   Highest education level: High school  graduate  Occupational History   Occupation: retired  Tobacco Use   Smoking status: Never   Smokeless tobacco: Never  Vaping Use   Vaping Use: Never used  Substance and Sexual Activity   Alcohol use: No    Alcohol/week: 0.0 standard drinks   Drug use: No   Sexual activity: Not on file  Other Topics Concern   Not on file  Social History Narrative   Not on file   Social Determinants of Health   Financial Resource Strain: Low Risk    Difficulty of Paying Living Expenses: Not hard at all  Food Insecurity: No Food Insecurity   Worried About Charity fundraiser in the Last Year: Never true   [place] in the Last Year: Never true  Transportation Needs: No Transportation Needs   Lack of Transportation (Medical): No   Lack of Transportation (Non-Medical): No  Physical Activity: Inactive   Days of Exercise per Week: 0 days   Minutes of Exercise per Session: 0 min  Stress: No Stress Concern Present   Feeling of Stress : Not at all  Social Connections: Moderately Integrated   Frequency of Communication with Friends and Family: More than three times a week   Frequency of Social Gatherings with Friends and Family: More than three times a week   Attends Religious Services: More than 4 times per year   Active Member of [employer] or Organizations: Yes   Attends Archivist Meetings: More than 4 times per year   Marital Status: Widowed  Human resources officer Violence: Not At Risk   Fear of Current or Ex-Partner: No   Emotionally Abused: No   Physically Abused: No   Sexually Abused: No   Social History   Social History Narrative   Not on file     ROS: Negative.     PE: HEENT: Negative. Lungs: Clear. Cardio: RR.   Assessment/Plan:  Proceed with planned upper endoscopy, possible dilatation.   Forest Gleason [facility] 09/10/2020

## 2020-09-10 NOTE — Transfer of Care (Signed)
Immediate Anesthesia Transfer of Care Note  Patient: Kathy Pacheco  Procedure(s) Performed: ESOPHAGOGASTRODUODENOSCOPY (EGD) WITH PROPOFOL  Patient Location: PACU  Anesthesia Type:General  Level of Consciousness: awake and alert   Airway & Oxygen Therapy: Patient Spontanous Breathing and Patient connected to nasal cannula oxygen  Post-op Assessment: Report given to RN and Post -op Vital signs reviewed and stable  Post vital signs: Reviewed and stable  Last Vitals:  Vitals Value Taken Time  BP 144/77 09/10/20 1109  Temp 36.3 C 09/10/20 1109  Pulse 70 09/10/20 1113  Resp 20 09/10/20 1113  SpO2 96 % 09/10/20 1113  Vitals shown include unvalidated device data.  Last Pain:  Vitals:   09/10/20 1109  TempSrc: Temporal  PainSc: 0-No pain         Complications: No notable events documented.

## 2020-09-10 NOTE — Anesthesia Postprocedure Evaluation (Signed)
Anesthesia Post Note  Patient: Kathy Pacheco  Procedure(s) Performed: ESOPHAGOGASTRODUODENOSCOPY (EGD) WITH PROPOFOL  Patient location during evaluation: Endoscopy Anesthesia Type: General Level of consciousness: awake and alert Pain management: pain level controlled Vital Signs Assessment: post-procedure vital signs reviewed and stable Respiratory status: spontaneous breathing, nonlabored ventilation, respiratory function stable and patient connected to nasal cannula oxygen Cardiovascular status: blood pressure returned to baseline and stable Postop Assessment: no apparent nausea or vomiting Anesthetic complications: no   No notable events documented.   Last Vitals:  Vitals:   09/10/20 1109 09/10/20 1119  BP: (!) 144/77 (!) 159/81  Pulse: 72 65  Resp: (!) 21 19  Temp: (!) 36.3 C   SpO2: 93% 99%    Last Pain:  Vitals:   09/10/20 1119  TempSrc:   PainSc: 0-No pain                 Arita Miss

## 2020-09-10 NOTE — Anesthesia Procedure Notes (Signed)
Date/Time: 09/10/2020 10:45 AM Performed by: Johnna Acosta, CRNA Pre-anesthesia Checklist: Patient identified, Emergency Drugs available, Patient being monitored, Suction available and Timeout performed Patient Re-evaluated:Patient Re-evaluated prior to induction Oxygen Delivery Method: Nasal cannula Preoxygenation: Pre-oxygenation with 100% oxygen Induction Type: IV induction

## 2020-09-10 NOTE — Op Note (Signed)
[facility] Gastroenterology Patient Name: Kathy Pacheco Procedure Date: 09/10/2020 10:31 AM MRN: [id number] Account #: [id number] Date of Birth: 05-Sep-1931 Admit Type: Outpatient Age: 87 Room: [facility] ENDO ROOM 1 Gender: Female Note Status: Finalized Procedure:             Upper GI endoscopy Indications:           Nausea Providers:             Robert Bellow, MD Referring MD:          Janine Ores. Rosanna Randy, MD (Referring MD) Medicines:             Propofol per Anesthesia Complications:         No immediate complications. Procedure:             Pre-Anesthesia Assessment:                        - Prior to the procedure, a History and Physical was                         performed, and patient medications, allergies and                         sensitivities were reviewed. The patient's tolerance                         of previous anesthesia was reviewed.                        - The risks and benefits of the procedure and the                         sedation options and risks were discussed with the                         patient. All questions were answered and informed                         consent was obtained.                        After obtaining informed consent, the endoscope was                         passed under direct vision. Throughout the procedure,                         the patient's blood pressure, pulse, and oxygen                         saturations were monitored continuously. The Endoscope                         was introduced through the mouth, and advanced to the                         second part of duodenum. The upper GI endoscopy was  accomplished without difficulty. The patient tolerated                         the procedure well. Findings:      The esophagus was normal.      Diffuse moderate inflammation characterized by congestion (edema) and       erythema was found in the prepyloric region of the stomach.  Biopsies       were taken with a cold forceps for histology.      A single 7 mm sessile polyp was found in the duodenal bulb. Biopsies       were taken with a cold forceps for histology. Impression:            - Normal esophagus.                        - Chronic gastritis. Biopsied.                        - A single duodenal polyp. Biopsied. Recommendation:        - Telephone endoscopist for pathology results in 1                         week. Procedure Code(s):     --- Professional ---                        [phone], Esophagogastroduodenoscopy, flexible,                         transoral; with biopsy, single or multiple Diagnosis Code(s):     --- Professional ---                        K29.50, Unspecified chronic gastritis without bleeding                        K31.7, Polyp of stomach and duodenum                        R11.0, Nausea CPT copyright 2019 American Medical Association. All rights reserved. The codes documented in this report are preliminary and upon coder review may  be revised to meet current compliance requirements. Robert Bellow, MD 09/10/2020 11:05:14 AM This report has been signed electronically. Number of Addenda: 0 Note Initiated On: 09/10/2020 10:31 AM Estimated Blood Loss:  Estimated blood loss: none.      [facility]

## 2020-09-10 NOTE — Anesthesia Preprocedure Evaluation (Addendum)
Anesthesia Evaluation  Patient identified by MRN, date of birth, ID band Patient awake    Reviewed: Allergy & Precautions, NPO status , Patient's Chart, lab work & pertinent test results  History of Anesthesia Complications (+) PONV and history of anesthetic complications  Airway Mallampati: III  TM Distance: >3 FB Neck ROM: Full    Dental  (+) Teeth Intact   Pulmonary sleep apnea , COPD, Patient abstained from smoking.Not current smoker,    Pulmonary exam normal breath sounds clear to auscultation       Cardiovascular Exercise Tolerance: Good METShypertension, + Peripheral Vascular Disease  (-) CAD and (-) Past MI (-) dysrhythmias  Rhythm:Regular Rate:Normal - Systolic murmurs TTE [id number]: 1. Left ventricular ejection fraction, by estimation, is 60 to 65%. The  left ventricle has normal function. The left ventricle has no regional  wall motion abnormalities. Left ventricular diastolic parameters were  normal.  2. Right ventricular systolic function is normal. The right ventricular  size is normal.  3. The mitral valve is normal in structure. Mild mitral valve  regurgitation.  4. The aortic valve is calcified. Aortic valve regurgitation is trivial.  Mild to moderate aortic valve sclerosis/calcification is present, without  any evidence of aortic stenosis.    Neuro/Psych PSYCHIATRIC DISORDERS Anxiety Depression negative neurological ROS     GI/Hepatic GERD  ,(+)     (-) substance abuse  ,   Endo/Other  neg diabetes  Renal/GU negative Renal ROS     Musculoskeletal   Abdominal   Peds  Hematology   Anesthesia Other Findings Past Medical History: No date: Anxiety No date: Depression No date: Diverticulosis No date: GERD (gastroesophageal reflux disease) No date: Hyperlipidemia No date: Hypertension No date: PONV (postoperative nausea and vomiting) No date: PVD (peripheral vascular disease) ([facility]) No date:  Sleep apnea  Reproductive/Obstetrics                            Anesthesia Physical Anesthesia Plan  ASA: 2  Anesthesia Plan: General   Post-op Pain Management:    Induction: Intravenous  PONV Risk Score and Plan: 4 or greater and Ondansetron, Propofol infusion and TIVA  Airway Management Planned: Nasal Cannula  Additional Equipment: None  Intra-op Plan:   Post-operative Plan:   Informed Consent: I have reviewed the patients History and Physical, chart, labs and discussed the procedure including the risks, benefits and alternatives for the proposed anesthesia with the patient or authorized representative who has indicated his/her understanding and acceptance.     Dental advisory given  Plan Discussed with: CRNA and Surgeon  Anesthesia Plan Comments: (Discussed risks of anesthesia with patient, including possibility of difficulty with spontaneous ventilation under anesthesia necessitating airway intervention, PONV, and rare risks such as cardiac or respiratory or neurological events, and allergic reactions. Patient understands.)        Anesthesia Quick Evaluation

## 2020-09-13 ENCOUNTER — Encounter: Payer: Self-pay | Admitting: General Surgery

## 2020-09-14 LAB — SURGICAL PATHOLOGY

## 2020-09-16 ENCOUNTER — Ambulatory Visit: Payer: Medicare Other | Admitting: *Deleted

## 2020-09-16 DIAGNOSIS — F339 Major depressive disorder, recurrent, unspecified: Secondary | ICD-10-CM

## 2020-09-16 DIAGNOSIS — G3184 Mild cognitive impairment, so stated: Secondary | ICD-10-CM

## 2020-09-19 NOTE — Patient Instructions (Signed)
Visit Information   Goals Addressed             This Visit's Progress    Find Help in My Community       Timeframe:  Long-Range Goal Priority:  Medium Start Date:   09/07/20                          Expected End Date:  03/10/21                     Follow Up Date 09/30/20    - follow-up on any referrals for help I am given - think ahead to make sure my need does not become an emergency - have a back-up plan    Why is this important?   Knowing how and where to find help for yourself or family in your neighborhood and community is an important skill.  You will want to take some steps to learn how.    Notes:         The patient verbalized understanding of instructions, educational materials, and care plan provided today and declined offer to receive copy of patient instructions, educational materials, and care plan.   Telephone follow up appointment with care management team member scheduled for: 09/23/20   Elliot Gurney, [place] Worker  [facility] Care Management [phone]

## 2020-09-19 NOTE — Chronic Care Management (AMB) (Signed)
Chronic Care Management    Clinical Social Work Note  [id number] Name: Kathy Pacheco MRN: [id number] DOB: [id number]  Kathy Pacheco is a 87 y.o. year old female who is a primary care patient of Jerrol Banana., MD. The CCM team was consulted to assist the patient with chronic disease management and/or care coordination needs related to: [employer] .   Engaged with patient by telephone for follow up visit in response to provider referral for social work chronic care management and care coordination services.   Consent to Services:  The patient was given information about Chronic Care Management services, agreed to services, and gave verbal consent prior to initiation of services.  Please see initial visit note for detailed documentation.   Patient agreed to services and consent obtained.   Assessment: Review of patient past medical history, allergies, medications, and health status, including review of relevant consultants reports was performed today as part of a comprehensive evaluation and provision of chronic care management and care coordination services.     SDOH (Social Determinants of Health) assessments and interventions performed:    Advanced Directives Status: Not addressed in this encounter.  CCM Care Plan  Allergies  Allergen Reactions   Ciprofloxacin Itching    Outpatient Encounter Medications as of 09/16/2020  Medication Sig   acetaminophen (TYLENOL) 500 MG tablet Take 500 mg by mouth every 6 (six) hours as needed for mild pain.    albuterol (VENTOLIN HFA) 108 (90 Base) MCG/ACT inhaler Inhale 2 puffs into the lungs every 6 (six) hours as needed for wheezing or shortness of breath.   amLODipine (NORVASC) 5 MG tablet TAKE 1 TABLET (5 MG TOTAL) BY MOUTH DAILY.   aspirin EC 81 MG tablet Take 81 mg by mouth daily. Swallow whole.   busPIRone (BUSPAR) 30 MG tablet Take 30 mg by mouth in the morning and at bedtime.   calcium carbonate (OS-CAL) 600 MG TABS  tablet Take 600 mg by mouth 2 (two) times daily with a meal.   cetirizine (ZYRTEC ALLERGY) 10 MG tablet Take 1 tablet (10 mg total) by mouth daily.   famotidine (PEPCID) 40 MG tablet Take 40 mg by mouth at bedtime.   fluticasone (FLONASE) 50 MCG/ACT nasal spray Place 1 spray into both nostrils daily as needed for allergies or rhinitis.   fluticasone-salmeterol (ADVAIR) 500-50 MCG/ACT AEPB Inhale 1 puff into the lungs as needed.   meclizine (ANTIVERT) 25 MG tablet Take 25 mg by mouth 3 (three) times daily as needed for dizziness.   ondansetron (ZOFRAN ODT) 4 MG disintegrating tablet Allow 1-2 tablets to dissolve in your mouth every 8 hours as needed for nausea/vomiting   pantoprazole (PROTONIX) 40 MG tablet 40 mg 2 (two) times daily. 30 minutes before meals   rosuvastatin (CRESTOR) 10 MG tablet Take 1 tablet (10 mg total) by mouth daily.   No facility-administered encounter medications on file as of 09/16/2020.    Patient Active Problem List   Diagnosis Date Noted   Chronic cough 12/17/2018   Syncope 01/07/2017   UTI (urinary tract infection) 01/07/2017   Absolute anemia 11/03/2014   Bilateral cataracts 11/03/2014   COPD, mild ([employer]) 11/03/2014   Acute cystitis 11/03/2014   Eczema of external ear 11/03/2014   Esophagitis, reflux 11/03/2014   Anxiety, generalized 11/03/2014   Acid reflux 11/03/2014   Hypercholesteremia 11/03/2014   HLD (hyperlipidemia) 11/03/2014   BP (high blood pressure) 11/03/2014   Affective disorder, major 11/03/2014   Menopausal and perimenopausal disorder  11/03/2014   Obstructive apnea 11/03/2014   Awareness of heartbeats 11/03/2014   Hypercholesterolemia without hypertriglyceridemia 11/03/2014   Allergic rhinitis, seasonal 11/03/2014   H/O cataract extraction 11/03/2014   Anxiety 12/16/2013   Apnea, sleep 12/16/2013    Conditions to be addressed/monitored:  Mild Cognitive impairment ; Level of care concerns  Care Plan : General Social Work (Adult)   Updates made by Vern Claude, LCSW since 09/19/2020 12:00 AM     Problem: Caregiver Stress      Goal: Caregiver Coping Optimized   Start Date: 09/07/2020  Expected End Date: 03/10/2021  This Visit's Progress: On track  Priority: Medium  Note:   Current Barriers:  Level of care concerns and ADL IADL limitations Suicidal Ideation/Homicidal Ideation: No  Clinical Social Work Goal(s):  Over the next 90 days, patient will work with SW bi-weekly by telephone or in person to reduce or manage need for in home support  Interventions: Follow up call to patient and  patient's daughter to discuss in home needs Medication adherence discussed with patient stating that she has done a better  in the last 2 weeks and was able to verbalize feeling better medically when taking her medications Daughter confirmed discussing issue with her provider who changed 3 of her medications to night time, also discussed posting  reminders up  Importance of remaining active discussed, with patient stating that she would like to remain in her home, active with her church activities-plans to join the church choir-walks to the mail box Main concern for medication adherance discussed as well as possible in home care for added support. Resources for in home care provided, consultation with a in home agency scheduled for 09/21/20. Patient tearful during the call with safety and support being reinforced as the top priority Active listening / Reflection utilized   Emotional SupportProvided  Patient Self Care Activities:  Strong family or social support  Patient Coping Strengths:  Family  Patient Self Care Deficits:  Unable to self administer medications as prescribed  Initial goal documentation       Follow Up Plan: SW will follow up with patient by phone over the next 14 business days       [place], [place] Worker  [facility] [phone]

## 2020-09-20 ENCOUNTER — Ambulatory Visit (LOCAL_COMMUNITY_HEALTH_CENTER)

## 2020-09-20 ENCOUNTER — Other Ambulatory Visit: Payer: Self-pay

## 2020-09-20 DIAGNOSIS — Z23 Encounter for immunization: Secondary | ICD-10-CM

## 2020-09-20 NOTE — Progress Notes (Signed)
Requesting Tdap only.

## 2020-10-06 DIAGNOSIS — I10 Essential (primary) hypertension: Secondary | ICD-10-CM

## 2020-10-06 DIAGNOSIS — F339 Major depressive disorder, recurrent, unspecified: Secondary | ICD-10-CM

## 2020-10-13 ENCOUNTER — Encounter: Payer: Self-pay | Admitting: *Deleted

## 2020-10-13 ENCOUNTER — Telehealth: Payer: Self-pay | Admitting: *Deleted

## 2020-10-13 NOTE — Telephone Encounter (Signed)
   [id number]  [name] [id number]    Care Management   Follow Up Note   [id number] Name: Kathy Pacheco MRN: [id number] DOB: 1931-06-08   Referred by: Jerrol Banana., MD Reason for referral : Care Coordination   An unsuccessful telephone outreach was attempted today. The patient was referred to the case management team for assistance with care management and care coordination.   Follow Up Plan: Telephone follow up appointment with care management team member scheduled for:10/14/20   Elliot Gurney, [place] Worker  [facility] [phone]

## 2020-10-13 NOTE — Telephone Encounter (Deleted)
  Care Management   Follow Up Note   [id number] Name: Kathy Pacheco MRN: [id number] DOB: Feb 20, 1931   Referred by: Jerrol Banana., MD Reason for referral : Care Coordination   An unsuccessful telephone outreach was attempted today. The patient was referred to the case management team for assistance with care management and care coordination.   Follow Up Plan: Telephone follow up appointment with care management team member scheduled for:10/14/20   Elliot Gurney, [place] Worker  [facility] [phone]

## 2020-10-13 NOTE — Telephone Encounter (Signed)
This encounter was created in error - please disregard.

## 2020-10-14 ENCOUNTER — Ambulatory Visit (INDEPENDENT_AMBULATORY_CARE_PROVIDER_SITE_OTHER): Payer: Medicare Other | Admitting: *Deleted

## 2020-10-14 DIAGNOSIS — G3184 Mild cognitive impairment, so stated: Secondary | ICD-10-CM

## 2020-10-14 DIAGNOSIS — I10 Essential (primary) hypertension: Secondary | ICD-10-CM

## 2020-10-14 DIAGNOSIS — F339 Major depressive disorder, recurrent, unspecified: Secondary | ICD-10-CM

## 2020-10-14 NOTE — Chronic Care Management (AMB) (Signed)
Chronic Care Management    Clinical Social Work Note  [id number] Name: Kathy Pacheco MRN: [id number] DOB: [id number]  Kathy Pacheco is a 87 y.o. year old female who is a primary care patient of Jerrol Banana., MD. The CCM team was consulted to assist the patient with chronic disease management and/or care coordination needs related to: [employer] .   Engaged with patient by telephone for follow up visit in response to provider referral for social work chronic care management and care coordination services.   Consent to Services:  The patient was given information about Chronic Care Management services, agreed to services, and gave verbal consent prior to initiation of services.  Please see initial visit note for detailed documentation.   Patient agreed to services and consent obtained.   Assessment: Review of patient past medical history, allergies, medications, and health status, including review of relevant consultants reports was performed today as part of a comprehensive evaluation and provision of chronic care management and care coordination services.     SDOH (Social Determinants of Health) assessments and interventions performed:    Advanced Directives Status: Not addressed in this encounter.  CCM Care Plan  Allergies  Allergen Reactions   Ciprofloxacin Itching    Outpatient Encounter Medications as of 10/14/2020  Medication Sig   acetaminophen (TYLENOL) 500 MG tablet Take 500 mg by mouth every 6 (six) hours as needed for mild pain.    albuterol (VENTOLIN HFA) 108 (90 Base) MCG/ACT inhaler Inhale 2 puffs into the lungs every 6 (six) hours as needed for wheezing or shortness of breath.   amLODipine (NORVASC) 5 MG tablet TAKE 1 TABLET (5 MG TOTAL) BY MOUTH DAILY.   aspirin EC 81 MG tablet Take 81 mg by mouth daily. Swallow whole.   busPIRone (BUSPAR) 30 MG tablet Take 30 mg by mouth in the morning and at bedtime.   calcium carbonate (OS-CAL) 600 MG TABS  tablet Take 600 mg by mouth 2 (two) times daily with a meal.   cetirizine (ZYRTEC ALLERGY) 10 MG tablet Take 1 tablet (10 mg total) by mouth daily.   famotidine (PEPCID) 40 MG tablet Take 40 mg by mouth at bedtime.   fluticasone (FLONASE) 50 MCG/ACT nasal spray Place 1 spray into both nostrils daily as needed for allergies or rhinitis.   fluticasone-salmeterol (ADVAIR) 500-50 MCG/ACT AEPB Inhale 1 puff into the lungs as needed.   meclizine (ANTIVERT) 25 MG tablet Take 25 mg by mouth 3 (three) times daily as needed for dizziness.   ondansetron (ZOFRAN ODT) 4 MG disintegrating tablet Allow 1-2 tablets to dissolve in your mouth every 8 hours as needed for nausea/vomiting   pantoprazole (PROTONIX) 40 MG tablet 40 mg 2 (two) times daily. 30 minutes before meals   rosuvastatin (CRESTOR) 10 MG tablet Take 1 tablet (10 mg total) by mouth daily.   No facility-administered encounter medications on file as of 10/14/2020.    Patient Active Problem List   Diagnosis Date Noted   Chronic cough 12/17/2018   Syncope 01/07/2017   UTI (urinary tract infection) 01/07/2017   Absolute anemia 11/03/2014   Bilateral cataracts 11/03/2014   COPD, mild ([place]) 11/03/2014   Acute cystitis 11/03/2014   Eczema of external ear 11/03/2014   Esophagitis, reflux 11/03/2014   Anxiety, generalized 11/03/2014   Acid reflux 11/03/2014   Hypercholesteremia 11/03/2014   HLD (hyperlipidemia) 11/03/2014   BP (high blood pressure) 11/03/2014   Affective disorder, major 11/03/2014   Menopausal and perimenopausal disorder  11/03/2014   Obstructive apnea 11/03/2014   Awareness of heartbeats 11/03/2014   Hypercholesterolemia without hypertriglyceridemia 11/03/2014   Allergic rhinitis, seasonal 11/03/2014   H/O cataract extraction 11/03/2014   Anxiety 12/16/2013   Apnea, sleep 12/16/2013    Conditions to be addressed/monitored: Mild cognitive impairment, level of care concerns;ADL IADL limitations and Cognitive Deficits  Care  Plan : General Social Work (Adult)  Updates made by Vern Claude, LCSW since 10/14/2020 12:00 AM     Problem: Caregiver Stress      Goal: Caregiver Coping Optimized   Start Date: 09/07/2020  Expected End Date: 10/14/2020  This Visit's Progress: On track  Recent Progress: On track  Priority: Medium  Note:   Current Barriers:  Level of care concerns and ADL IADL limitations Suicidal Ideation/Homicidal Ideation: No  Clinical Social Work Goal(s):  Over the next 90 days, patient will work with SW bi-weekly by telephone or in person to reduce or manage need for in home support  Interventions: Follow up call to patient and  patient's daughter to discuss in home needs Confirmed that Medication adherence has improved, patient's daughter continues to fill her pill box and after multiple efforts/strategies of reinforcing the importance of taking her medication Patient's daughter confirmed that she has spoken twice to in home agency-[facility]-however this service did not work out-she has been in talks with [facility] and as well as [facility] and is considering service with them to provide medication reminders, blood pressure checks and light housekeeping Benefits of in home care discussed with patient and patient is willing to seriously consider this Patient and patient's daughter verbalized having no additional community resource needs and will continue to coordinate private duty care with a in home care agency of choice       Patient Self Care Activities:  Strong family or social support  Patient Coping Strengths:  Family  Patient Self Care Deficits:  Unable to self administer medications as prescribed  Please see past updates related to this goal by clicking on the "Past Updates" button in the selected goal        Follow Up Plan: Client/client's daughter will contact this social worker with any additional community resource needs        [place],  [place] Social Worker  [facility] [phone]

## 2020-10-14 NOTE — Patient Instructions (Signed)
Visit Information   Goals Addressed             This Visit's Progress    Find Help in My Community       Timeframe:  Long-Range Goal Priority:  Medium Start Date:   09/07/20                          Expected End Date: 10/14/20                    Follow Up Date 10/14/20    - follow-up on any referrals for help including in home care - think ahead to make sure my need does not become an emergency - have a back-up plan    Why is this important?   Knowing how and where to find help for yourself or family in your neighborhood and community is an important skill.  You will want to take some steps to learn how.    Notes:         The patient verbalized understanding of instructions, educational materials, and care plan provided today and declined offer to receive copy of patient instructions, educational materials, and care plan.   No further follow up required: Patient to call this Education officer, museum with any additional community resource needs   [employer], [place] Worker  [facility] [phone]

## 2020-11-05 ENCOUNTER — Other Ambulatory Visit: Payer: Self-pay | Admitting: Family Medicine

## 2020-11-05 DIAGNOSIS — F339 Major depressive disorder, recurrent, unspecified: Secondary | ICD-10-CM | POA: Diagnosis not present

## 2020-11-05 DIAGNOSIS — I10 Essential (primary) hypertension: Secondary | ICD-10-CM | POA: Diagnosis not present

## 2020-11-09 ENCOUNTER — Other Ambulatory Visit: Payer: Self-pay | Admitting: Family Medicine

## 2020-11-09 NOTE — Telephone Encounter (Signed)
Pharmacy called and spoke to [place], [facility] about the refill(s) Amlodipine requested. Advised it was sent on 11/05/20 #90/0 refill(s). He says that refill was not received, advised it will be resent.

## 2020-11-09 NOTE — Telephone Encounter (Signed)
Requested Prescriptions  Pending Prescriptions Disp Refills  . amLODipine (NORVASC) 5 MG tablet [Pharmacy Med Name: AMLODIPINE BESYLATE 5 MG TAB] 90 tablet 0    Sig: TAKE 1 TABLET (5 MG TOTAL) BY MOUTH DAILY.     Cardiovascular:  Calcium Channel Blockers Failed - 11/09/2020  9:00 AM      Failed - Last BP in normal range    BP Readings from Last 1 Encounters:  09/10/20 (!) 159/81         Failed - Valid encounter within last 6 months    Recent Outpatient Visits          2 months ago Primary hypertension   [facility] Jerrol Banana., MD   8 months ago Primary hypertension   [facility] Jerrol Banana., MD   1 year ago Weight loss   [facility] Jerrol Banana., MD   1 year ago B12 deficiency   [facility] Jerrol Banana., MD   1 year ago Essential hypertension   [facility] Jerrol Banana., MD      Future Appointments            In 2 weeks Jerrol Banana., MD [facility], [place]

## 2020-11-29 ENCOUNTER — Ambulatory Visit (INDEPENDENT_AMBULATORY_CARE_PROVIDER_SITE_OTHER): Payer: Medicare Other | Admitting: Family Medicine

## 2020-11-29 ENCOUNTER — Other Ambulatory Visit: Payer: Self-pay

## 2020-11-29 VITALS — BP 149/75 | HR 81 | Temp 98.3°F | Wt 127.0 lb

## 2020-11-29 DIAGNOSIS — K219 Gastro-esophageal reflux disease without esophagitis: Secondary | ICD-10-CM

## 2020-11-29 DIAGNOSIS — Z23 Encounter for immunization: Secondary | ICD-10-CM | POA: Diagnosis not present

## 2020-11-29 DIAGNOSIS — E7800 Pure hypercholesterolemia, unspecified: Secondary | ICD-10-CM

## 2020-11-29 DIAGNOSIS — G3184 Mild cognitive impairment, so stated: Secondary | ICD-10-CM | POA: Diagnosis not present

## 2020-11-29 DIAGNOSIS — I10 Essential (primary) hypertension: Secondary | ICD-10-CM | POA: Diagnosis not present

## 2020-11-29 DIAGNOSIS — J302 Other seasonal allergic rhinitis: Secondary | ICD-10-CM

## 2020-11-29 DIAGNOSIS — G4733 Obstructive sleep apnea (adult) (pediatric): Secondary | ICD-10-CM | POA: Diagnosis not present

## 2020-11-29 DIAGNOSIS — F419 Anxiety disorder, unspecified: Secondary | ICD-10-CM

## 2020-11-29 NOTE — Progress Notes (Signed)
Established patient visit   Patient: Kathy Pacheco   DOB: 09-Mar-1931   87 y.o. Female  MRN: [id number] Visit Date: 11/29/2020  Today's healthcare provider: [name], MD   No chief complaint on file.  Subjective    HPI  Patient is an 87 year old female who presents for follow up of her mild cognitive impairment.  She was last seen 3 months ago and is due for MMSE today.  Patient feels she is doing well for her age.  She denies forgetting to turn burners off when cooking.  She still drives and denies having had any episode where she did not know where she was going or get turned around. Patient has no complaints today. Daughter sends back a lengthy note stating that the patient is very independent but is having difficulties and has not adjusted very well to aging and her memory loss.  Evidently she gets cumulative with her daughter and family  MMSE is done today and patient scored 47 The patient is actually stopped several of her medications.  She is no longer taking albuterol BuSpar calcium Zyrtec Flonase Advair Antivert or Zofran.   Medications: Outpatient Medications Prior to Visit  Medication Sig   amLODipine (NORVASC) 5 MG tablet TAKE 1 TABLET (5 MG TOTAL) BY MOUTH DAILY.   aspirin EC 81 MG tablet Take 81 mg by mouth daily. Swallow whole.   pantoprazole (PROTONIX) 40 MG tablet 40 mg 2 (two) times daily. 30 minutes before meals   rosuvastatin (CRESTOR) 10 MG tablet Take 1 tablet (10 mg total) by mouth daily.   acetaminophen (TYLENOL) 500 MG tablet Take 500 mg by mouth every 6 (six) hours as needed for mild pain.  (Patient not taking: Reported on 11/29/2020)   albuterol (VENTOLIN HFA) 108 (90 Base) MCG/ACT inhaler Inhale 2 puffs into the lungs every 6 (six) hours as needed for wheezing or shortness of breath. (Patient not taking: Reported on 11/29/2020)   busPIRone (BUSPAR) 30 MG tablet Take 30 mg by mouth in the morning and at bedtime. (Patient not taking: Reported  on 11/29/2020)   calcium carbonate (OS-CAL) 600 MG TABS tablet Take 600 mg by mouth 2 (two) times daily with a meal. (Patient not taking: Reported on 11/29/2020)   cetirizine (ZYRTEC ALLERGY) 10 MG tablet Take 1 tablet (10 mg total) by mouth daily. (Patient not taking: Reported on 11/29/2020)   famotidine (PEPCID) 40 MG tablet Take 40 mg by mouth at bedtime. (Patient not taking: Reported on 11/29/2020)   fluticasone (FLONASE) 50 MCG/ACT nasal spray Place 1 spray into both nostrils daily as needed for allergies or rhinitis. (Patient not taking: Reported on 11/29/2020)   fluticasone-salmeterol (ADVAIR) 500-50 MCG/ACT AEPB Inhale 1 puff into the lungs as needed. (Patient not taking: Reported on 11/29/2020)   meclizine (ANTIVERT) 25 MG tablet Take 25 mg by mouth 3 (three) times daily as needed for dizziness. (Patient not taking: Reported on 11/29/2020)   ondansetron (ZOFRAN ODT) 4 MG disintegrating tablet Allow 1-2 tablets to dissolve in your mouth every 8 hours as needed for nausea/vomiting (Patient not taking: Reported on 11/29/2020)   No facility-administered medications prior to visit.    Review of Systems  Respiratory:  Negative for cough and shortness of breath.   Cardiovascular:  Negative for chest pain.  Psychiatric/Behavioral:  Negative for confusion, decreased concentration, dysphoric mood and sleep disturbance. The patient is not nervous/anxious.        Objective    BP Marland Kitchen)  149/75 (BP Location: Right Arm, Patient Position: Sitting, Cuff Size: Normal)   Pulse 81   Temp 98.3 F (36.8 C) (Oral)   Wt 127 lb (57.6 kg)   SpO2 100%   BMI 22.50 kg/m  BP Readings from Last 3 Encounters:  11/29/20 (!) 149/75  09/10/20 (!) 159/81  08/26/20 (!) 160/75   Wt Readings from Last 3 Encounters:  11/29/20 127 lb (57.6 kg)  09/10/20 120 lb (54.4 kg)  08/26/20 130 lb (59 kg)      Physical Exam Vitals reviewed.  Constitutional:      Appearance: Normal appearance.  HENT:     Head:  Normocephalic and atraumatic.     Right Ear: External ear normal.     Left Ear: External ear normal.  Eyes:     General: No scleral icterus.    Conjunctiva/sclera: Conjunctivae normal.  Cardiovascular:     Rate and Rhythm: Normal rate and regular rhythm.     Pulses: Normal pulses.     Heart sounds: Normal heart sounds.  Pulmonary:     Effort: Pulmonary effort is normal.     Breath sounds: Normal breath sounds.  Musculoskeletal:     Right lower leg: No edema.     Left lower leg: No edema.  Skin:    General: Skin is warm and dry.  Neurological:     General: No focal deficit present.     Mental Status: She is alert.  Psychiatric:        Mood and Affect: Mood normal.        Behavior: Behavior normal.        Thought Content: Thought content normal.        Judgment: Judgment normal.      No results found for any visits on 11/29/20.  Assessment & Plan     1. MCI (mild cognitive impairment) MMSE on next visit.  He declines any medications and I would start with donepezil  2. Need for influenza vaccination  - Flu Vaccine QUAD High Dose(Fluad)  3. Primary hypertension [place] increasing amlodipine as she does not want to take more pills.  4. Seasonal allergic rhinitis, unspecified trigger Doing well off of medication  5. Obstructive apnea Follow clinically  6. Gastroesophageal reflux disease without esophagitis On famotidine and controlled  7. Hypercholesteremia Patient states she is taking her rosuvastatin  8. Anxiety Chronic issue that might worsen with her cognitive impairment. PHQ-9 and GAD-7 on next visit   No follow-ups on file.      I, [name], MD, have reviewed all documentation for this visit. The documentation on 12/03/20 for the exam, diagnosis, procedures, and orders are all accurate and complete.    Nayellie Sanseverino Cranford Mon, MD  [facility] [phone] (phone) [phone] (fax)  [place]

## 2020-11-30 DIAGNOSIS — K219 Gastro-esophageal reflux disease without esophagitis: Secondary | ICD-10-CM | POA: Diagnosis not present

## 2021-02-12 ENCOUNTER — Other Ambulatory Visit: Payer: Self-pay | Admitting: Family Medicine

## 2021-02-12 NOTE — Telephone Encounter (Signed)
Requested Prescriptions  Pending Prescriptions Disp Refills   amLODipine (NORVASC) 5 MG tablet [Pharmacy Med Name: AMLODIPINE BESYLATE 5 MG TAB] 90 tablet 0    Sig: TAKE 1 TABLET (5 MG TOTAL) BY MOUTH DAILY.     Cardiovascular:  Calcium Channel Blockers Failed - 02/12/2021 10:57 AM      Failed - Last BP in normal range    BP Readings from Last 1 Encounters:  11/29/20 (!) 149/75         Passed - Valid encounter within last 6 months    Recent Outpatient Visits          2 months ago MCI (mild cognitive impairment)   [facility] Jerrol Banana., MD   5 months ago Primary hypertension   [facility] Jerrol Banana., MD   11 months ago Primary hypertension   [facility] Jerrol Banana., MD   1 year ago Weight loss   [facility] Jerrol Banana., MD   1 year ago B12 deficiency   [facility] Jerrol Banana., MD      Future Appointments            In 2 months Jerrol Banana., MD [facility], [place]

## 2021-02-23 ENCOUNTER — Ambulatory Visit (INDEPENDENT_AMBULATORY_CARE_PROVIDER_SITE_OTHER): Payer: Medicare Other

## 2021-02-23 DIAGNOSIS — Z0000 Encounter for general adult medical examination without abnormal findings: Secondary | ICD-10-CM

## 2021-02-23 NOTE — Patient Instructions (Signed)
[name] , Thank you for taking time to come for your Medicare Wellness Visit. I appreciate your ongoing commitment to your health goals. Please review the following plan we discussed and let me know if I can assist you in the future.   Screening recommendations/referrals: Colonoscopy: aged out Mammogram: 11/23/15, declined referral Bone Density: 04/03/18, declined referral Recommended yearly ophthalmology/optometry visit for glaucoma screening and checkup Recommended yearly dental visit for hygiene and checkup  Vaccinations: Influenza vaccine: 11/29/20 Pneumococcal vaccine: 08/04/13 Tdap vaccine: 09/20/20 Shingles vaccine: n/d   Covid-19:02/27/19, 03/20/19, 03/10/20  Advanced directives: no  Conditions/risks identified: none  Next appointment: Follow up in one year for your annual wellness visit 02/27/22 @ 1:40 pm by phone   Preventive Care 65 Years and Older, Female Preventive care refers to lifestyle choices and visits with your health care provider that can promote health and wellness. What does preventive care include? A yearly physical exam. This is also called an annual well check. Dental exams once or twice a year. Routine eye exams. Ask your health care provider how often you should have your eyes checked. Personal lifestyle choices, including: Daily care of your teeth and gums. Regular physical activity. Eating a healthy diet. Avoiding tobacco and drug use. Limiting alcohol use. Practicing safe sex. Taking low-dose aspirin every day. Taking vitamin and mineral supplements as recommended by your health care provider. What happens during an annual well check? The services and screenings done by your health care provider during your annual well check will depend on your age, overall health, lifestyle risk factors, and family history of disease. Counseling  Your health care provider may ask you questions about your: Alcohol use. Tobacco use. Drug use. Emotional  well-being. Home and relationship well-being. Sexual activity. Eating habits. History of falls. Memory and ability to understand (cognition). Work and work Statistician. Reproductive health. Screening  You may have the following tests or measurements: Height, weight, and BMI. Blood pressure. Lipid and cholesterol levels. These may be checked every 5 years, or more frequently if you are over 16 years old. Skin check. Lung cancer screening. You may have this screening every year starting at age 86 if you have a 30-pack-year history of smoking and currently smoke or have quit within the past 15 years. Fecal occult blood test (FOBT) of the stool. You may have this test every year starting at age 86. Flexible sigmoidoscopy or colonoscopy. You may have a sigmoidoscopy every 5 years or a colonoscopy every 10 years starting at age 86. Hepatitis C blood test. Hepatitis B blood test. Sexually transmitted disease (STD) testing. Diabetes screening. This is done by checking your blood sugar (glucose) after you have not eaten for a while (fasting). You may have this done every 1-3 years. Bone density scan. This is done to screen for osteoporosis. You may have this done starting at age 86. Mammogram. This may be done every 1-2 years. Talk to your health care provider about how often you should have regular mammograms. Talk with your health care provider about your test results, treatment options, and if necessary, the need for more tests. Vaccines  Your health care provider may recommend certain vaccines, such as: Influenza vaccine. This is recommended every year. Tetanus, diphtheria, and acellular pertussis (Tdap, Td) vaccine. You may need a Td booster every 10 years. Zoster vaccine. You may need this after age 86. Pneumococcal 13-valent conjugate (PCV13) vaccine. One dose is recommended after age 86. Pneumococcal polysaccharide (PPSV23) vaccine. One dose is recommended after age 86.  Talk to your  health care provider about which screenings and vaccines you need and how often you need them. This information is not intended to replace advice given to you by your health care provider. Make sure you discuss any questions you have with your health care provider. Document Released: 02/19/2015 Document Revised: 10/13/2015 Document Reviewed: 11/24/2014 Elsevier Interactive Patient Education  2017 [employer] Prevention in the Home Falls can cause injuries. They can happen to people of all ages. There are many things you can do to make your home safe and to help prevent falls. What can I do on the outside of my home? Regularly fix the edges of walkways and driveways and fix any cracks. Remove anything that might make you trip as you walk through a door, such as a raised step or threshold. Trim any bushes or trees on the path to your home. Use bright outdoor lighting. Clear any walking paths of anything that might make someone trip, such as rocks or tools. Regularly check to see if handrails are loose or broken. Make sure that both sides of any steps have handrails. Any raised decks and porches should have guardrails on the edges. Have any leaves, snow, or ice cleared regularly. Use sand or salt on walking paths during winter. Clean up any spills in your garage right away. This includes oil or grease spills. What can I do in the bathroom? Use night lights. Install grab bars by the toilet and in the tub and shower. Do not use towel bars as grab bars. Use non-skid mats or decals in the tub or shower. If you need to sit down in the shower, use a plastic, non-slip stool. Keep the floor dry. Clean up any water that spills on the floor as soon as it happens. Remove soap buildup in the tub or shower regularly. Attach bath mats securely with double-sided non-slip rug tape. Do not have throw rugs and other things on the floor that can make you trip. What can I do in the bedroom? Use night  lights. Make sure that you have a light by your bed that is easy to reach. Do not use any sheets or blankets that are too big for your bed. They should not hang down onto the floor. Have a firm chair that has side arms. You can use this for support while you get dressed. Do not have throw rugs and other things on the floor that can make you trip. What can I do in the kitchen? Clean up any spills right away. Avoid walking on wet floors. Keep items that you use a lot in easy-to-reach places. If you need to reach something above you, use a strong step stool that has a grab bar. Keep electrical cords out of the way. Do not use floor polish or wax that makes floors slippery. If you must use wax, use non-skid floor wax. Do not have throw rugs and other things on the floor that can make you trip. What can I do with my stairs? Do not leave any items on the stairs. Make sure that there are handrails on both sides of the stairs and use them. Fix handrails that are broken or loose. Make sure that handrails are as long as the stairways. Check any carpeting to make sure that it is firmly attached to the stairs. Fix any carpet that is loose or worn. Avoid having throw rugs at the top or bottom of the stairs. If you do have throw  rugs, attach them to the floor with carpet tape. Make sure that you have a light switch at the top of the stairs and the bottom of the stairs. If you do not have them, ask someone to add them for you. What else can I do to help prevent falls? Wear shoes that: Do not have high heels. Have rubber bottoms. Are comfortable and fit you well. Are closed at the toe. Do not wear sandals. If you use a stepladder: Make sure that it is fully opened. Do not climb a closed stepladder. Make sure that both sides of the stepladder are locked into place. Ask someone to hold it for you, if possible. Clearly mark and make sure that you can see: Any grab bars or handrails. First and last  steps. Where the edge of each step is. Use tools that help you move around (mobility aids) if they are needed. These include: Canes. Walkers. Scooters. Crutches. Turn on the lights when you go into a dark area. Replace any light bulbs as soon as they burn out. Set up your furniture so you have a clear path. Avoid moving your furniture around. If any of your floors are uneven, fix them. If there are any pets around you, be aware of where they are. Review your medicines with your doctor. Some medicines can make you feel dizzy. This can increase your chance of falling. Ask your doctor what other things that you can do to help prevent falls. This information is not intended to replace advice given to you by your health care provider. Make sure you discuss any questions you have with your health care provider. Document Released: 11/19/2008 Document Revised: 07/01/2015 Document Reviewed: 02/27/2014 Elsevier Interactive Patient Education  2017 [employer].

## 2021-02-23 NOTE — Progress Notes (Signed)
Virtual Visit via Telephone Note  I connected with  Kathy Pacheco on [birth year] at  1:40 PM EST by telephone and verified that I am speaking with the correct person using two identifiers.  Location: Patient: home Provider: [facility] Persons participating in the virtual visit: [place]   I discussed the limitations, risks, security and privacy concerns of performing an evaluation and management service by telephone and the availability of in person appointments. The patient expressed understanding and agreed to proceed.  Interactive audio and video telecommunications were attempted between this nurse and patient, however failed, due to patient having technical difficulties OR patient did not have access to video capability.  We continued and completed visit with audio only.  Some vital signs may be absent or patient reported.   Dionisio David, LPN  Subjective:   Kathy Pacheco is a 88 y.o. female who presents for Medicare Annual (Subsequent) preventive examination.  Review of Systems           Objective:    There were no vitals filed for this visit. There is no height or weight on file to calculate BMI.  Advanced Directives 09/10/2020 07/21/2020 06/02/2020 06/01/2020 02/18/2020 12/27/2019 12/15/2019  Does Patient Have a Medical Advance Directive? No No - No No No No  Would patient like information on creating a medical advance directive? - No - Patient declined No - Patient declined - No - Patient declined No - Patient declined -    Current Medications (verified) Outpatient Encounter Medications as of 02/23/2021  Medication Sig   [DISCONTINUED] famotidine (PEPCID) 40 MG tablet Take 1 tablet by mouth at bedtime.   [DISCONTINUED] pantoprazole (PROTONIX) 40 MG tablet Take by mouth.   acetaminophen (TYLENOL) 500 MG tablet Take 500 mg by mouth every 6 (six) hours as needed for mild pain.  (Patient not taking: Reported on 11/29/2020)   albuterol (VENTOLIN HFA) 108 (90 Base)  MCG/ACT inhaler Inhale 2 puffs into the lungs every 6 (six) hours as needed for wheezing or shortness of breath. (Patient not taking: Reported on 11/29/2020)   amLODipine (NORVASC) 5 MG tablet TAKE 1 TABLET (5 MG TOTAL) BY MOUTH DAILY.   aspirin EC 81 MG tablet Take 81 mg by mouth daily. Swallow whole.   busPIRone (BUSPAR) 30 MG tablet Take 30 mg by mouth in the morning and at bedtime. (Patient not taking: Reported on 11/29/2020)   calcium carbonate (OS-CAL) 600 MG TABS tablet Take 600 mg by mouth 2 (two) times daily with a meal. (Patient not taking: Reported on 11/29/2020)   cetirizine (ZYRTEC ALLERGY) 10 MG tablet Take 1 tablet (10 mg total) by mouth daily. (Patient not taking: Reported on 11/29/2020)   famotidine (PEPCID) 40 MG tablet Take 40 mg by mouth at bedtime. (Patient not taking: Reported on 11/29/2020)   fluticasone (FLONASE) 50 MCG/ACT nasal spray Place 1 spray into both nostrils daily as needed for allergies or rhinitis. (Patient not taking: Reported on 11/29/2020)   fluticasone-salmeterol (ADVAIR) 500-50 MCG/ACT AEPB Inhale 1 puff into the lungs as needed. (Patient not taking: Reported on 11/29/2020)   meclizine (ANTIVERT) 25 MG tablet Take 25 mg by mouth 3 (three) times daily as needed for dizziness. (Patient not taking: Reported on 11/29/2020)   ondansetron (ZOFRAN ODT) 4 MG disintegrating tablet Allow 1-2 tablets to dissolve in your mouth every 8 hours as needed for nausea/vomiting (Patient not taking: Reported on 11/29/2020)   pantoprazole (PROTONIX) 40 MG tablet 40 mg 2 (two) times daily. 30 minutes  before meals   rosuvastatin (CRESTOR) 10 MG tablet Take 1 tablet (10 mg total) by mouth daily.   [DISCONTINUED] fluticasone-salmeterol (ADVAIR DISKUS) 500-50 MCG/ACT AEPB Inhale into the lungs.   No facility-administered encounter medications on file as of 02/23/2021.    Allergies (verified) Ciprofloxacin   History: Past Medical History:  Diagnosis Date   Anxiety    Depression     Diverticulosis    GERD (gastroesophageal reflux disease)    Hyperlipidemia    Hypertension    PONV (postoperative nausea and vomiting)    PVD (peripheral vascular disease) (HCC)    Sleep apnea    Past Surgical History:  Procedure Laterality Date   CATARACT EXTRACTION Bilateral    COLONOSCOPY     ESOPHAGOGASTRODUODENOSCOPY (EGD) WITH PROPOFOL N/A 09/10/2020   Procedure: ESOPHAGOGASTRODUODENOSCOPY (EGD) WITH PROPOFOL;  Surgeon: Robert Bellow, MD;  Location: [facility];  Service: Endoscopy;  Laterality: N/A;   ETHMOIDECTOMY Bilateral 03/10/2016   Procedure: ETHMOIDECTOMY;  Surgeon: Beverly Gust, MD;  Location: [place];  Service: ENT;  Laterality: Bilateral;   FRONTAL SINUS EXPLORATION Bilateral 03/10/2016   Procedure: FRONTAL WITH TISSUE REMOVAL;  Surgeon: Beverly Gust, MD;  Location: [place];  Service: ENT;  Laterality: Bilateral;   [place] SINUS SURGERY Bilateral 03/10/2016   Procedure: IMAGE GUIDED SINUS SURGERY;  Surgeon: Beverly Gust, MD;  Location: [place];  Service: ENT;  Laterality: Bilateral;  Need Disk GAVE DISK TO CECE 1-25 KP   MAXILLARY ANTROSTOMY Bilateral 03/10/2016   Procedure: MAXILLARY ANTROSTOMY;  Surgeon: Beverly Gust, MD;  Location: [place];  Service: ENT;  Laterality: Bilateral;   NASAL TURBINATE REDUCTION Bilateral 03/10/2016   Procedure: TURBINATE REDUCTION/SUBMUCOSAL RESECTION;  Surgeon: Beverly Gust, MD;  Location: [place];  Service: ENT;  Laterality: Bilateral;   SEPTOPLASTY Bilateral 03/10/2016   Procedure: SEPTOPLASTY;  Surgeon: Beverly Gust, MD;  Location: [place];  Service: ENT;  Laterality: Bilateral;   SPHENOIDECTOMY Bilateral 03/10/2016   Procedure: Coralee Pesa WITH TISSUE REMOVAL;  Surgeon: Beverly Gust, MD;  Location: [place];  Service: ENT;  Laterality: Bilateral;   Family History  Problem Relation Age of Onset    Epilepsy Mother    Osteoporosis Mother    Coronary artery disease Mother    COPD Father    Hypertension Father    CVA Father    Allergies Father    Lung cancer Father    Emphysema Father    Cancer Sister        breast   Breast cancer Sister 46   Osteoporosis Sister    COPD Sister    Coronary artery disease Brother    CVA Brother    COPD Brother    Heart disease Brother    Arthritis Brother    CVA Brother    Cancer Brother        cancer   Healthy Brother    Healthy Brother    Cancer Maternal Grandmother    Hypertension Maternal Grandmother    Social History   Socioeconomic History   Marital status: Widowed    Spouse name: Not on file   Number of children: 1   Years of education: Not on file   Highest education level: High school graduate  Occupational History   Occupation: retired  Tobacco Use   Smoking status: Never   Smokeless tobacco: Never  Vaping Use   Vaping Use: Never used  Substance and Sexual Activity  Alcohol use: No    Alcohol/week: 0.0 standard drinks   Drug use: No   Sexual activity: Not on file  Other Topics Concern   Not on file  Social History Narrative   Not on file   Social Determinants of Health   Financial Resource Strain: Not on file  Food Insecurity: No Food Insecurity   Worried About Running Out of Food in the Last Year: Never true   Ran Out of Food in the Last Year: Never true  Transportation Needs: Not on file  Physical Activity: Not on file  Stress: Not on file  Social Connections: Not on file    Tobacco Counseling Counseling given: Not Answered   Clinical Intake:  Pre-visit preparation completed: Yes  Pain : No/denies pain     Nutritional Risks: None Diabetes: No  How often do you need to have someone help you when you read instructions, pamphlets, or other written materials from your doctor or pharmacy?: 1 - Never  Diabetic?no  Interpreter Needed?: No  Information entered by :: Kirke Shaggy,  LPN   Activities of Daily Living In your present state of health, do you have any difficulty performing the following activities: 06/01/2020  Hearing? Y  Vision? N  Comment patient wears reading glasses  Difficulty concentrating or making decisions? N  Walking or climbing stairs? N  Dressing or bathing? N  Doing errands, shopping? Y  Some recent data might be hidden    Patient Care Team: Jerrol Banana., MD as PCP - General (Family Medicine) Beverly Gust, MD (Otolaryngology) Estill Cotta, MD (Ophthalmology) Ubaldo Glassing Javier Docker, MD as Consulting Physician (Cardiology) Eugenie Birks Christian Mate., MD (Dentistry) [place], [place], [place] as Social Worker  Indicate any recent Medical Services you may have received from other than [facility] providers in the past year (date may be approximate).     Assessment:   This is a routine wellness examination for [place].  Hearing/Vision screen No results found.  Dietary issues and exercise activities discussed:     Goals Addressed   None    Depression Screen PHQ 2/9 Scores 08/26/2020 02/18/2020 06/09/2019 02/17/2019 02/17/2019 02/07/2018 12/14/2016  PHQ - 2 Score 0 0 1 0 0 0 0  PHQ- 9 Score 0 - 3 - - - -    Fall Risk Fall Risk  02/18/2020 02/17/2019 02/07/2018 12/25/2017 12/14/2016  Falls in the past year? 0 0 0 0 No  Comment - - - Emmi Telephone Survey: data to providers prior to load -  Number falls in past yr: 0 0 0 - -  Injury with Fall? 0 0 0 - -    FALL RISK PREVENTION PERTAINING TO THE HOME:  Any stairs in or around the home? Yes  If so, are there any without handrails? No  Home free of loose throw rugs in walkways, pet beds, electrical cords, etc? Yes  Adequate lighting in your home to reduce risk of falls? Yes   ASSISTIVE DEVICES UTILIZED TO PREVENT FALLS:  Life alert? Yes  Use of a cane, walker or w/c? No  Grab bars in the bathroom? No  Shower chair or bench in shower? No  Elevated toilet seat or a handicapped toilet? Yes      Cognitive Function: MMSE - Mini Mental State Exam 11/29/2020 06/25/2019 06/09/2019  Orientation to time 4 5 4   Orientation to Place 5 5 5   Registration 3 3 3   Attention/ Calculation 5 5 5   Recall 0 3 3  Language- name 2  objects 2 2 2   Language- repeat 1 1 1   Language- follow 3 step command 3 3 3   Language- read & follow direction 1 1 1   Write a sentence 1 1 1   Copy design 1 1 1   Total score 26 30 29      6CIT Screen 02/07/2018  What Year? 0 points  What month? 0 points  What time? 0 points  Count back from 20 0 points  Months in reverse 0 points  Repeat phrase 0 points  Total Score 0    Immunizations Immunization History  Administered Date(s) Administered   Fluad Quad(high Dose 65+) 10/22/2019, 11/29/2020   Influenza Split 12/02/2005, 11/19/2010, 12/02/2011   Influenza, High Dose Seasonal PF 10/28/2013, 11/04/2014, 11/04/2015, 11/22/2016, 11/21/2017, 10/30/2018   Influenza,inj,Quad PF,6+ Mos 11/02/2012   PFIZER(Purple Top)SARS-COV-2 Vaccination 02/27/2019, 03/20/2019, 03/10/2020   Pneumococcal Conjugate-13 08/04/2013   Pneumococcal Polysaccharide-23 05/02/2009   Td 09/10/2007   Tdap 09/20/2020    TDAP status: Up to date  Flu Vaccine status: Up to date  Pneumococcal vaccine status: Up to date  Covid-19 vaccine status: Completed vaccines  Qualifies for Shingles Vaccine? No   Zostavax completed No   Shingrix Completed?: No.    Education has been provided regarding the importance of this vaccine. Patient has been advised to call insurance company to determine out of pocket expense if they have not yet received this vaccine. Advised may also receive vaccine at local pharmacy or Health Dept. Verbalized acceptance and understanding.  Screening Tests Health Maintenance  Topic Date Due   Zoster Vaccines- Shingrix (1 of 2) Never done   DEXA SCAN  04/03/2020   COVID-19 Vaccine (4 - Booster for Pfizer series) 05/05/2020   TETANUS/TDAP  09/21/2030   Pneumonia Vaccine  45+ Years old  Completed   INFLUENZA VACCINE  Completed   HPV VACCINES  Aged Out    Health Maintenance  Health Maintenance Due  Topic Date Due   Zoster Vaccines- Shingrix (1 of 2) Never done   DEXA SCAN  04/03/2020   COVID-19 Vaccine (4 - Booster for [place] series) 05/05/2020    Colorectal cancer screening: No longer required.   Mammogram status: Completed 11/23/15. Repeat every year- declined referral  Bone Density status: Completed 04/03/18. Results reflect: Bone density results: NORMAL. Repeat every 5 years.  Lung Cancer Screening: (Low Dose CT Chest recommended if Age 4-80 years, 30 pack-year currently smoking OR have quit w/in 15years.) does not qualify.   Additional Screening:  Hepatitis C Screening: does not qualify; Completed no  Vision Screening: Recommended annual ophthalmology exams for early detection of glaucoma and other disorders of the eye. Is the patient up to date with their annual eye exam?  Yes  Who is the provider or what is the name of the office in which the patient attends annual eye exams? [facility] If pt is not established with a provider, would they like to be referred to a provider to establish care? No .   Dental Screening: Recommended annual dental exams for proper oral hygiene  Community Resource Referral / Chronic Care Management: CRR required this visit?  No   CCM required this visit?  No      Plan:     I have personally reviewed and noted the following in the patients chart:   Medical and social history Use of alcohol, tobacco or illicit drugs  Current medications and supplements including opioid prescriptions.  Functional ability and status Nutritional status Physical activity Advanced directives List of  other physicians Hospitalizations, surgeries, and ER visits in previous 12 months Vitals Screenings to include cognitive, depression, and falls Referrals and appointments  In addition, I have reviewed and  discussed with patient certain preventive protocols, quality metrics, and best practice recommendations. A written personalized care plan for preventive services as well as general preventive health recommendations were provided to patient.     Dionisio David, LPN   [id number]   Nurse Notes: none

## 2021-04-12 DIAGNOSIS — H6123 Impacted cerumen, bilateral: Secondary | ICD-10-CM | POA: Diagnosis not present

## 2021-04-12 DIAGNOSIS — H903 Sensorineural hearing loss, bilateral: Secondary | ICD-10-CM | POA: Diagnosis not present

## 2021-05-09 ENCOUNTER — Ambulatory Visit: Payer: Medicare Other | Admitting: Family Medicine

## 2021-05-17 ENCOUNTER — Encounter: Payer: Self-pay | Admitting: Family Medicine

## 2021-05-17 ENCOUNTER — Ambulatory Visit (INDEPENDENT_AMBULATORY_CARE_PROVIDER_SITE_OTHER): Payer: Medicare Other | Admitting: Family Medicine

## 2021-05-17 VITALS — BP 135/71 | HR 16 | Temp 98.3°F | Resp 16 | Wt 130.0 lb

## 2021-05-17 DIAGNOSIS — K2100 Gastro-esophageal reflux disease with esophagitis, without bleeding: Secondary | ICD-10-CM

## 2021-05-17 DIAGNOSIS — N189 Chronic kidney disease, unspecified: Secondary | ICD-10-CM

## 2021-05-17 DIAGNOSIS — I10 Essential (primary) hypertension: Secondary | ICD-10-CM

## 2021-05-17 DIAGNOSIS — E7800 Pure hypercholesterolemia, unspecified: Secondary | ICD-10-CM | POA: Diagnosis not present

## 2021-05-17 DIAGNOSIS — E538 Deficiency of other specified B group vitamins: Secondary | ICD-10-CM | POA: Diagnosis not present

## 2021-05-17 DIAGNOSIS — F419 Anxiety disorder, unspecified: Secondary | ICD-10-CM | POA: Diagnosis not present

## 2021-05-17 DIAGNOSIS — G3184 Mild cognitive impairment, so stated: Secondary | ICD-10-CM

## 2021-05-17 MED ORDER — DONEPEZIL HCL 5 MG PO TABS: 5.0000 mg | ORAL_TABLET | Freq: Every day | ORAL | 5 refills | Status: DC

## 2021-05-17 NOTE — Progress Notes (Signed)
?  ? ? ?Established patient visit ? ?I,[name],acting as a scribe for Kathy Durie, MD.,have documented all relevant documentation on the behalf of Kathy Durie, MD,as directed by  Kathy Durie, MD while in the presence of Kathy Durie, MD. ? ? ?Patient: Kathy Pacheco   DOB: 1931-03-24   88 y.o. Female  MRN: [id number] ?Visit Date: 05/17/2021 ? ?Today's healthcare provider: [name], MD  ? ?Chief Complaint  ?Patient presents with  ? Follow-up  ? Hypertension  ? Hyperlipidemia  ? MCI   ? ?Subjective  ?  ?HPI  ?Patient comes in today for follow-up.  She feels she is doing fairly well.  Her daughter is with her.  Condition is relatively stable.  No falls and no dangerous behavior.   ?Hypertension, follow-up ? ?BP Readings from Last 3 Encounters:  ?05/17/21 135/71  ?11/29/20 (!) 149/75  ?09/10/20 (!) 159/81  ? Wt Readings from Last 3 Encounters:  ?05/17/21 130 lb (59 kg)  ?11/29/20 127 lb (57.6 kg)  ?09/10/20 120 lb (54.4 kg)  ?  ? ?She was last seen for hypertension 7 months ago.  ?BP at that visit was 149/75. ?Management since that visit includes; Jittery increasing amlodipine as she does not want to take more pills.Marland Kitchen ?She reports good compliance with treatment. ?She is not having side effects. none ?She is exercising. ?She is adherent to low salt diet.   ?Outside blood pressures are normal. ? ?She does not smoke. ? ?Use of agents associated with hypertension: none.  ? ?--------------------------------------------------------------------------------------------------- ?Lipid/Cholesterol, follow-up ? ?Last Lipid Panel: ?Lab Results  ?Component Value Date  ? CHOL 284 (H) 08/26/2020  ? [place] 211 (H) 08/26/2020  ? HDL 49 08/26/2020  ? TRIG 132 08/26/2020  ? ? ?She was last seen for this 7 months ago.  ?Management since that visit includes; Patient states she is taking her rosuvastatin. ? ?She reports good compliance with treatment. ?She is not having side effects. none ? ?She is  following a Regular diet. ?Current exercise: walking ? ?Last metabolic panel ?Lab Results  ?Component Value Date  ? GLUCOSE 88 08/26/2020  ? NA 139 08/26/2020  ? K 4.9 08/26/2020  ? BUN 18 08/26/2020  ? CREATININE 1.22 (H) 08/26/2020  ? EGFR 43 (L) 08/26/2020  ? GFRNONAA 44 (L) 07/21/2020  ? CALCIUM 9.7 08/26/2020  ? AST 7 08/26/2020  ? ALT 5 08/26/2020  ? ?The ASCVD Risk score (Arnett DK, et al., 2019) failed to calculate for the following reasons: ?  The 2019 ASCVD risk score is only valid for ages 68 to 18 ? ?--------------------------------------------------------------------------------------------------- ?Follow up for MCI (mild cognitive impairment): ? ?The patient was last seen for this 7 months ago. ?Changes made at last visit include; MMSE on next visit.  He declines any medications and I would start with donepezil. ? ?She reports good compliance with treatment. ?She feels that condition is Unchanged. ?She is not having side effects. none ? ? ? ?  05/17/2021  ? 12:04 PM 11/29/2020  ? 10:49 AM 06/25/2019  ? 10:30 AM  ?MMSE - Mini Mental State Exam  ?Orientation to time _0 ?Orientation to Place _1 ?Registration _2 ?Attention/ Calculation _3 ?Recall 2 0 3  ?Language- name 2 objects _4 ?Language- repeat _5 ?Language- follow 3 step command _6 ?Language- read & follow direction _7 ?Write a  sentence 1 1 1  ?Copy design 1 1 1  ?Total score 24 26 30  ? ?---------------------------------------------------------------------------------------- ? ? ?Medications: ?Outpatient Medications Prior to Visit  ?Medication Sig  ? acetaminophen (TYLENOL) 500 MG tablet Take 500 mg by mouth every 6 (six) hours as needed for mild pain.  ? amLODipine (NORVASC) 5 MG tablet TAKE 1 TABLET (5 MG TOTAL) BY MOUTH DAILY.  ? aspirin EC 81 MG tablet Take 81 mg by mouth daily. Swallow whole.  ? famotidine (PEPCID) 40 MG tablet Take 40 mg by mouth at bedtime.  ? fluticasone (FLONASE) 50 MCG/ACT nasal spray Place 1  spray into both nostrils daily as needed for allergies or rhinitis.  ? ondansetron (ZOFRAN ODT) 4 MG disintegrating tablet Allow 1-2 tablets to dissolve in your mouth every 8 hours as needed for nausea/vomiting  ? pantoprazole (PROTONIX) 40 MG tablet 40 mg 2 (two) times daily. 30 minutes before meals  ? rosuvastatin (CRESTOR) 10 MG tablet Take 1 tablet (10 mg total) by mouth daily.  ? albuterol (VENTOLIN HFA) 108 (90 Base) MCG/ACT inhaler Inhale 2 puffs into the lungs every 6 (six) hours as needed for wheezing or shortness of breath. (Patient not taking: Reported on 11/29/2020)  ? busPIRone (BUSPAR) 30 MG tablet Take 30 mg by mouth in the morning and at bedtime. (Patient not taking: Reported on 11/29/2020)  ? calcium carbonate (OS-CAL) 600 MG TABS tablet Take 600 mg by mouth 2 (two) times daily with a meal. (Patient not taking: Reported on 11/29/2020)  ? cetirizine (ZYRTEC ALLERGY) 10 MG tablet Take 1 tablet (10 mg total) by mouth daily. (Patient not taking: Reported on 11/29/2020)  ? fluticasone-salmeterol (ADVAIR) 500-50 MCG/ACT AEPB Inhale 1 puff into the lungs as needed. (Patient not taking: Reported on 11/29/2020)  ? meclizine (ANTIVERT) 25 MG tablet Take 25 mg by mouth 3 (three) times daily as needed for dizziness. (Patient not taking: Reported on 11/29/2020)  ? ?No facility-administered medications prior to visit.  ? ? ?Review of Systems  ?Constitutional:  Negative for appetite change, chills, fatigue and fever.  ?Respiratory:  Negative for chest tightness and shortness of breath.   ?Cardiovascular:  Negative for chest pain and palpitations.  ?Gastrointestinal:  Negative for abdominal pain, nausea and vomiting.  ?Neurological:  Negative for dizziness and weakness.  ? ?Last thyroid functions ?Lab Results  ?Component Value Date  ? TSH 1.530 08/26/2020  ? ?  ?  Objective  ?  ?BP 135/71 (BP Location: Right Arm, Patient Position: Sitting, Cuff Size: Normal)   Pulse (!) 16   Temp 98.3 ?F (36.8 ?C) (Temporal)    Resp 16   Wt 130 lb (59 kg)   SpO2 97%   BMI 23.03 kg/m?  ?BP Readings from Last 3 Encounters:  ?05/17/21 135/71  ?11/29/20 (!) 149/75  ?09/10/20 (!) 159/81  ? ?Wt Readings from Last 3 Encounters:  ?05/17/21 130 lb (59 kg)  ?11/29/20 127 lb (57.6 kg)  ?09/10/20 120 lb (54.4 kg)  ? ?  ? ?Physical Exam ?Vitals reviewed.  ?Constitutional:   ?   Appearance: Normal appearance.  ?HENT:  ?   Head: Normocephalic and atraumatic.  ?   Right Ear: External ear normal.  ?   Left Ear: External ear normal.  ?Eyes:  ?   General: No scleral icterus. ?   Conjunctiva/sclera: Conjunctivae normal.  ?Cardiovascular:  ?   Rate and Rhythm: Normal rate and regular rhythm.  ?   Pulses: Normal pulses.  ?   Heart sounds:   Normal heart sounds.  ?Pulmonary:  ?   Effort: Pulmonary effort is normal.  ?   Breath sounds: Normal breath sounds.  ?Musculoskeletal:  ?   Right lower leg: No edema.  ?   Left lower leg: No edema.  ?Skin: ?   General: Skin is warm and dry.  ?Neurological:  ?   General: No focal deficit present.  ?   Mental Status: She is alert.  ?Psychiatric:     ?   Mood and Affect: Mood normal.     ?   Behavior: Behavior normal.  ?  ? ? ?No results found for any visits on 05/17/21. ? Assessment & Plan  ?  ? ?1. Essential hypertension ?Good control on amlodipine ? ?2. Hypercholesteremia ?On rosuvastatin ? ?3. MCI (mild cognitive impairment) ?Pt/Daughter agreeable to trying donepezil now ?- donepezil (ARICEPT) 5 MG tablet; Take 1 tablet (5 mg total) by mouth at bedtime.  Dispense: 30 tablet; Refill: 5 ? ?4. Primary hypertension ? ? ?5. Anxiety ?Doing well on BuSpar ? ?6. Chronic kidney disease, unspecified CKD stage ?Avoid nonsteroidals. ? ?7. B12 deficiency ?Check B12 level in the future. ? ?8. Gastroesophageal reflux disease with esophagitis without hemorrhage ?Patient is not been taking Protonix regularly recently. ? ? ? ?No follow-ups on file.  ?   ? ?I, Kathy Durie, MD, have reviewed all documentation for this visit. The  documentation on 05/21/21 for the exam, diagnosis, procedures, and orders are all accurate and complete. ? ? ? ?Kiamesha Samet Cranford Mon, MD  ?[facility] ?[phone] (phone) ?[phone] (fax) ? ?[facility]

## 2021-06-17 ENCOUNTER — Other Ambulatory Visit: Payer: Self-pay | Admitting: Family Medicine

## 2021-06-18 ENCOUNTER — Other Ambulatory Visit: Payer: Self-pay

## 2021-06-18 ENCOUNTER — Emergency Department
Admission: EM | Admit: 2021-06-18 | Discharge: 2021-06-18 | Disposition: A | Discharge status: Home / Self Care | Payer: Medicare Other | Attending: Emergency Medicine | Admitting: Emergency Medicine

## 2021-06-18 DIAGNOSIS — J449 Chronic obstructive pulmonary disease, unspecified: Secondary | ICD-10-CM | POA: Insufficient documentation

## 2021-06-18 DIAGNOSIS — I10 Essential (primary) hypertension: Secondary | ICD-10-CM | POA: Diagnosis not present

## 2021-06-18 DIAGNOSIS — A045 Campylobacter enteritis: Secondary | ICD-10-CM | POA: Diagnosis not present

## 2021-06-18 DIAGNOSIS — I959 Hypotension, unspecified: Secondary | ICD-10-CM | POA: Diagnosis not present

## 2021-06-18 DIAGNOSIS — R197 Diarrhea, unspecified: Secondary | ICD-10-CM | POA: Diagnosis not present

## 2021-06-18 DIAGNOSIS — R1111 Vomiting without nausea: Secondary | ICD-10-CM | POA: Diagnosis not present

## 2021-06-18 LAB — GASTROINTESTINAL PANEL BY PCR, STOOL (REPLACES STOOL CULTURE)

## 2021-06-18 LAB — CBC
HCT: 36.6 % (ref 36.0–46.0)
Hemoglobin: 11.6 g/dL — ABNORMAL LOW (ref 12.0–15.0)
MCH: 27.4 pg (ref 26.0–34.0)
MCHC: 31.7 g/dL (ref 30.0–36.0)
MCV: 86.3 fL (ref 80.0–100.0)
Platelets: 315 10*3/uL (ref 150–400)
RBC: 4.24 MIL/uL (ref 3.87–5.11)
RDW: 15.3 % (ref 11.5–15.5)
WBC: 8.7 10*3/uL (ref 4.0–10.5)
nRBC: 0 % (ref 0.0–0.2)

## 2021-06-18 LAB — COMPREHENSIVE METABOLIC PANEL
ALT: 11 U/L (ref 0–44)
AST: 19 U/L (ref 15–41)
Albumin: 3.6 g/dL (ref 3.5–5.0)
Alkaline Phosphatase: 90 U/L (ref 38–126)
Anion gap: 9 (ref 5–15)
BUN: 21 mg/dL (ref 8–23)
CO2: 28 mmol/L (ref 22–32)
Calcium: 9.6 mg/dL (ref 8.9–10.3)
Chloride: 103 mmol/L (ref 98–111)
Creatinine, Ser: 1.28 mg/dL — ABNORMAL HIGH (ref 0.44–1.00)
GFR, Estimated: 41 mL/min — ABNORMAL LOW (ref >60)
Glucose, Bld: 118 mg/dL — ABNORMAL HIGH (ref 70–99)
Potassium: 3.7 mmol/L (ref 3.5–5.1)
Sodium: 140 mmol/L (ref 135–145)
Total Bilirubin: 0.5 mg/dL (ref 0.3–1.2)
Total Protein: 7.4 g/dL (ref 6.5–8.1)

## 2021-06-18 LAB — C DIFFICILE QUICK SCREEN W PCR REFLEX
C Diff antigen: NEGATIVE
C Diff interpretation: NOT DETECTED
C Diff toxin: NEGATIVE

## 2021-06-18 LAB — LIPASE, BLOOD: Lipase: 27 U/L (ref 11–51)

## 2021-06-18 MED ORDER — AZITHROMYCIN 500 MG PO TABS
500.0000 mg | ORAL_TABLET | Freq: Once | ORAL | Status: AC
  Administered 2021-06-18: 500 mg via ORAL
  Filled 2021-06-18: qty 1

## 2021-06-18 MED ORDER — ONDANSETRON 4 MG PO TBDP
4.0000 mg | ORAL_TABLET | Freq: Once | ORAL | Status: AC | PRN
  Administered 2021-06-18: 4 mg via ORAL
  Filled 2021-06-18: qty 1

## 2021-06-18 MED ORDER — AZITHROMYCIN 500 MG PO TABS: 500.0000 mg | ORAL_TABLET | Freq: Every day | ORAL | 0 refills | Status: AC

## 2021-06-18 MED ORDER — SODIUM CHLORIDE 0.9 % IV BOLUS
1000.0000 mL | Freq: Once | INTRAVENOUS | Status: AC
  Administered 2021-06-18: 1000 mL via INTRAVENOUS

## 2021-06-18 NOTE — Discharge Instructions (Signed)
Your stool was positive for Campylobacter which is a bacteria cause of gastroenteritis.  We gave you the first dose of antibiotics in the emergency department you should take the antibiotic once a day for the next 2 days after today.  If you are developing bloody stool fevers or worsening abdominal pain please return to the emergency department.  Please do not take Imodium or any other antidiarrheal medication as this is not safe when you have bacterial diarrhea. ?

## 2021-06-18 NOTE — ED Provider Notes (Signed)
? ?[facility] ?Provider Note ? ? ? Event Date/Time  ? First MD Initiated Contact with Patient 06/18/21 1222   ?  (approximate) ? ? ?History  ? ?Emesis and Diarrhea ? ? ?HPI ? ?Kathy Pacheco is a 88 y.o. female with past medical history of anxiety, depression hypertension hyperlipidemia presents with diarrhea.  Patient was out to breakfast was initially feeling okay after eating breakfast felt like she had to have a bowel movement.  She went to the bathroom and had diarrhea and then lost consciousness and woke up on the floor.  Has had multiple episodes of diarrhea since nonbloody.  Did have some abdominal cramping but this is largely resolved.  Think she vomited as well with no longer having nausea or vomiting.  Otherwise is feeling okay denies fevers chills shortness of breath cough chest pain.  Was feeling okay when she woke up today.  No sick contacts. ?  ? ?Past Medical History:  ?Diagnosis Date  ? Anxiety   ? Depression   ? Diverticulosis   ? GERD (gastroesophageal reflux disease)   ? Hyperlipidemia   ? Hypertension   ? PONV (postoperative nausea and vomiting)   ? PVD (peripheral vascular disease) ([place])   ? Sleep apnea   ? ? ?Patient Active Problem List  ? Diagnosis Date Noted  ? Chronic cough 12/17/2018  ? Syncope 01/07/2017  ? UTI (urinary tract infection) 01/07/2017  ? Absolute anemia 11/03/2014  ? Bilateral cataracts 11/03/2014  ? COPD, mild ([place]) 11/03/2014  ? Acute cystitis 11/03/2014  ? Eczema of external ear 11/03/2014  ? Esophagitis, reflux 11/03/2014  ? Anxiety, generalized 11/03/2014  ? Acid reflux 11/03/2014  ? Hypercholesteremia 11/03/2014  ? HLD (hyperlipidemia) 11/03/2014  ? BP (high blood pressure) 11/03/2014  ? Affective disorder, major 11/03/2014  ? Menopausal and perimenopausal disorder 11/03/2014  ? Obstructive apnea 11/03/2014  ? Awareness of heartbeats 11/03/2014  ? Hypercholesterolemia without hypertriglyceridemia 11/03/2014  ? Allergic rhinitis, seasonal  11/03/2014  ? H/O cataract extraction 11/03/2014  ? Anxiety 12/16/2013  ? Apnea, sleep 12/16/2013  ? ? ? ?Physical Exam  ?Triage Vital Signs: ?ED Triage Vitals [06/18/21 1157]  ?Enc Vitals Group  ?   BP (!) 128/55  ?   Pulse Rate 69  ?   Resp 18  ?   Temp 97.8 ?F (36.6 ?C)  ?   Temp Source Oral  ?   SpO2 97 %  ?   Weight 128 lb (58.1 kg)  ?   Height '5\' 2"'$  (1.575 m)  ?   Head Circumference   ?   Peak Flow   ?   Pain Score 0  ?   Pain Loc   ?   Pain Edu?   ?   Excl. in [place]?   ? ? ?Most recent vital signs: ?Vitals:  ? 06/18/21 1500 06/18/21 1542  ?BP: 122/60   ?Pulse: 70   ?Resp: 16   ?Temp:  98 ?F (36.7 ?C)  ?SpO2: 97%   ? ? ? ?General: Awake, no distress.  ?CV:  Good peripheral perfusion.  ?Resp:  Normal effort.  ?Abd:  No distention.  Soft and nontender throughout ?Neuro:             Awake, Alert, Oriented x 3  ?Other:   ? ? ?ED Results / Procedures / Treatments  ?Labs ?(all labs ordered are listed, but only abnormal results are displayed) ?Labs Reviewed  ?GASTROINTESTINAL PANEL BY PCR, STOOL (REPLACES STOOL CULTURE) -  Abnormal; Notable for the following components:  ?    Result Value  ? Campylobacter species DETECTED (*)   ? All other components within normal limits  ?COMPREHENSIVE METABOLIC PANEL - Abnormal; Notable for the following components:  ? Glucose, Bld 118 (*)   ? Creatinine, Ser 1.28 (*)   ? GFR, Estimated 41 (*)   ? All other components within normal limits  ?CBC - Abnormal; Notable for the following components:  ? Hemoglobin 11.6 (*)   ? All other components within normal limits  ?C DIFFICILE QUICK SCREEN W PCR REFLEX    ?LIPASE, BLOOD  ?URINALYSIS, ROUTINE W REFLEX MICROSCOPIC  ? ? ? ?EKG ? ?EKG interpretation performed by myself: NSR, nml axis, nml intervals, no acute ischemic changes\ ? ? ? ?RADIOLOGY ? ? ? ?PROCEDURES: ? ?Critical Care performed: No ? ?Procedures ? ? ?MEDICATIONS ORDERED IN ED: ?Medications  ?ondansetron (ZOFRAN-ODT) disintegrating tablet 4 mg (4 mg Oral Given 06/18/21 1203)  ?sodium  chloride 0.9 % bolus 1,000 mL (0 mLs Intravenous Stopped 06/18/21 1538)  ?azithromycin (ZITHROMAX) tablet 500 mg (500 mg Oral Given 06/18/21 1520)  ? ? ? ?IMPRESSION / MDM / ASSESSMENT AND PLAN / ED COURSE  ?I reviewed the triage vital signs and the nursing notes. ?             ?               ? ?Differential diagnosis includes, but is not limited to, colitis, viral GI illness, bacterial diarrhea, food poisoning ? ?Patient is a 88 year old female presenting with vomiting and diarrhea after eating breakfast this morning.  Occurred while she was at a restaurant initially felt okay and then felt she needed to have a bowel movement vomited and had diarrhea and passed out on the floor of the bathroom this was preceded by prodrome of lightheadedness.  Patient did have copious diarrhea in the ED and stool studies were sent.  Vitals within normal limits and she appears well she is not having abdominal pain and abdomen is benign.  Labs are also reassuring, she has no leukocytosis creatinine is near baseline.  We will give her a liter of fluid she is already received Zofran.  We will wait to see what the results of the stool panel are but if she is able to ambulate not feeling significantly weak and continues to have no abdominal pain I think she is likely appropriate for outpatient management. ? ?Patient's stool studies positive for Campylobacter.  She has felt well throughout her ED stay tolerating p.o. has not had any remaining diarrhea.  She does not have fever or bloody diarrhea but given her age I think she is at high risk for having a severe infection so we will opt to treat with 3 days of azithromycin.  We discussed return precautions.  Stressed the importance of not taking Imodium. ? ? ?FINAL CLINICAL IMPRESSION(S) / ED DIAGNOSES  ? ?Final diagnoses:  ?Campylobacter diarrhea  ? ? ? ?Rx / DC Orders  ? ?ED Discharge Orders   ? ?      Ordered  ?  azithromycin (ZITHROMAX) 500 MG tablet  Daily       ? 06/18/21 1515  ? ?  ?   ? ?  ? ? ? ?Note:  This document was prepared using Dragon voice recognition software and may include unintentional dictation errors. ?  ?Rada Hay, MD ?06/18/21 1622 ? ?

## 2021-06-18 NOTE — ED Notes (Signed)
Pt to ED for uncontrolled diarrhea. Pt stood up and cleaned in room and diarrhea continued to come out. Peri care performed and stool sample sent to lab. Pt also complains of vomiting.  ?

## 2021-06-18 NOTE — ED Triage Notes (Signed)
Pt states she went to a restaurant to eat and went to the bathroom because her stomach hurt. Pt states she had emesis and diarrhea in the bathroom and felt bad so she laid down in the floor of the bathroom. Restaurant staff called EMS. Pt denies LOC or hitting head.  ?

## 2021-07-27 ENCOUNTER — Ambulatory Visit: Payer: Medicare Other | Admitting: Family Medicine

## 2021-08-03 ENCOUNTER — Telehealth: Payer: Self-pay | Admitting: Family Medicine

## 2021-08-03 NOTE — Telephone Encounter (Signed)
Form being sent back for completion for [place] at [facility].  Call Judy Thompson(daughter) when completed   [phone]

## 2021-08-15 NOTE — Telephone Encounter (Signed)
Have you seen this form?

## 2021-08-22 ENCOUNTER — Telehealth: Payer: Self-pay

## 2021-08-22 NOTE — Telephone Encounter (Signed)
Copied from [place] [id number]. Topic: General - Other >> Aug 22, 2021  9:36 AM Everette C wrote: Reason for CRM: The patient's daughter has been advised of the instructions from the patient's PCP and will come by the office to pick up the forms and save them until the patient is ready to attend [facility]   Please contact further if needed

## 2021-08-22 NOTE — Telephone Encounter (Signed)
LMOVM for Kathy Pacheco to return call. The form she dropped off at the office requires a face-to-face visit. Per Dr. Rosanna Randy: need to schedule appt sometime after 09/06/2021. Okay for pec to advise patient's daughter. Thanks.

## 2021-08-22 NOTE — Telephone Encounter (Signed)
Noted  

## 2021-08-22 NOTE — Telephone Encounter (Signed)
See other phone message indicating the daughter has been informed

## 2021-09-07 ENCOUNTER — Ambulatory Visit: Payer: Medicare Other | Admitting: Family Medicine

## 2021-09-12 ENCOUNTER — Other Ambulatory Visit: Payer: Self-pay | Admitting: Family Medicine

## 2021-10-17 ENCOUNTER — Ambulatory Visit (INDEPENDENT_AMBULATORY_CARE_PROVIDER_SITE_OTHER): Payer: Medicare Other | Admitting: Dermatology

## 2021-10-17 ENCOUNTER — Encounter: Payer: Self-pay | Admitting: Dermatology

## 2021-10-17 DIAGNOSIS — L2081 Atopic neurodermatitis: Secondary | ICD-10-CM | POA: Diagnosis not present

## 2021-10-17 DIAGNOSIS — D492 Neoplasm of unspecified behavior of bone, soft tissue, and skin: Secondary | ICD-10-CM

## 2021-10-17 DIAGNOSIS — D229 Melanocytic nevi, unspecified: Secondary | ICD-10-CM | POA: Diagnosis not present

## 2021-10-17 DIAGNOSIS — L720 Epidermal cyst: Secondary | ICD-10-CM

## 2021-10-17 DIAGNOSIS — L814 Other melanin hyperpigmentation: Secondary | ICD-10-CM

## 2021-10-17 DIAGNOSIS — L821 Other seborrheic keratosis: Secondary | ICD-10-CM

## 2021-10-17 DIAGNOSIS — L820 Inflamed seborrheic keratosis: Secondary | ICD-10-CM

## 2021-10-17 DIAGNOSIS — Z1283 Encounter for screening for malignant neoplasm of skin: Secondary | ICD-10-CM | POA: Diagnosis not present

## 2021-10-17 DIAGNOSIS — B078 Other viral warts: Secondary | ICD-10-CM | POA: Diagnosis not present

## 2021-10-17 DIAGNOSIS — L578 Other skin changes due to chronic exposure to nonionizing radiation: Secondary | ICD-10-CM

## 2021-10-17 DIAGNOSIS — D1801 Hemangioma of skin and subcutaneous tissue: Secondary | ICD-10-CM | POA: Diagnosis not present

## 2021-10-17 MED ORDER — TRIAMCINOLONE ACETONIDE 0.1 % EX CREA: TOPICAL_CREAM | CUTANEOUS | 2 refills | Status: DC

## 2021-10-17 NOTE — Patient Instructions (Addendum)
Cryotherapy Aftercare  Wash gently with soap and water everyday.   Apply Vaseline daily until healed.   Wound Care Instructions  Cleanse wound gently with soap and water once a day then pat dry with clean gauze. Apply a thin coat of Petrolatum (petroleum jelly, "Vaseline") over the wound (unless you have an allergy to this). We recommend that you use a new, sterile tube of Vaseline. Do not pick or remove scabs. Do not remove the yellow or white "healing tissue" from the base of the wound.  Cover the wound with fresh, clean, nonstick gauze and secure with paper tape. You may use Band-Aids in place of gauze and tape if the wound is small enough, but would recommend trimming much of the tape off as there is often too much. Sometimes Band-Aids can irritate the skin.  You should call the office for your biopsy report after 1 week if you have not already been contacted.  If you experience any problems, such as abnormal amounts of bleeding, swelling, significant bruising, significant pain, or evidence of infection, please call the office immediately.  FOR ADULT SURGERY PATIENTS: If you need something for pain relief you may take 1 extra strength Tylenol (acetaminophen) AND 2 Ibuprofen ('200mg'$  each) together every 4 hours as needed for pain. (do not take these if you are allergic to them or if you have a reason you should not take them.) Typically, you may only need pain medication for 1 to 3 days.     Start Triamcinolone 0.1% cream twice daily to affected areas on body up to 2 weeks as needed for itching. Avoid applying to face, groin, and axilla. Use as directed. Long-term use can cause thinning of the skin.  Topical steroids (such as triamcinolone, fluocinolone, fluocinonide, mometasone, clobetasol, halobetasol, betamethasone, hydrocortisone) can cause thinning and lightening of the skin if they are used for too long in the same area. Your physician has selected the right strength medicine for your  problem and area affected on the body. Please use your medication only as directed by your physician to prevent side effects.   Skin care information given   Gentle Skin Care Guide  1. Bathe no more than once a day.  2. Avoid bathing in hot water  3. Use a mild soap like Dove, Vanicream, Cetaphil, CeraVe. Can use Lever 2000 or Cetaphil antibacterial soap  4. Use soap only where you need it. On most days, use it under your arms, between your legs, and on your feet. Let the water rinse other areas unless visibly dirty.  5. When you get out of the bath/shower, use a towel to gently blot your skin dry, don't rub it.  6. While your skin is still a little damp, apply a moisturizing cream such as Vanicream, CeraVe, Cetaphil, Eucerin, Sarna lotion or plain Vaseline Jelly. For hands apply Neutrogena [place] Hand Cream or Excipial Hand Cream.  7. Reapply moisturizer any time you start to itch or feel dry.  8. Sometimes using free and clear laundry detergents can be helpful. Fabric softener sheets should be avoided. Downy Free & Gentle liquid, or any liquid fabric softener that is free of dyes and perfumes, it acceptable to use  9. If your doctor has given you prescription creams you may apply moisturizers over them      Melanoma ABCDEs  Melanoma is the most dangerous type of skin cancer, and is the leading cause of death from skin disease.  You are more likely to develop melanoma if  you: Have light-colored skin, light-colored eyes, or red or blond hair Spend a lot of time in the sun Tan regularly, either outdoors or in a tanning bed Have had blistering sunburns, especially during childhood Have a close family member who has had a melanoma Have atypical moles or large birthmarks  Early detection of melanoma is key since treatment is typically straightforward and cure rates are extremely high if we catch it early.   The first sign of melanoma is often a change in a mole or a new dark  spot.  The ABCDE system is a way of remembering the signs of melanoma.  A for asymmetry:  The two halves do not match. B for border:  The edges of the growth are irregular. C for color:  A mixture of colors are present instead of an even brown color. D for diameter:  Melanomas are usually (but not always) greater than 49m - the size of a pencil eraser. E for evolution:  The spot keeps changing in size, shape, and color.  Please check your skin once per month between visits. You can use a small mirror in front and a large mirror behind you to keep an eye on the back side or your body.   If you see any new or changing lesions before your next follow-up, please call to schedule a visit.  Please continue daily skin protection including broad spectrum sunscreen SPF 30+ to sun-exposed areas, reapplying every 2 hours as needed when you're outdoors.   Staying in the shade or wearing long sleeves, sun glasses (UVA+UVB protection) and wide brim hats (4-inch brim around the entire circumference of the hat) are also recommended for sun protection.     Due to recent changes in healthcare laws, you may see results of your pathology and/or laboratory studies on MyChart before the doctors have had a chance to review them. We understand that in some cases there may be results that are confusing or concerning to you. Please understand that not all results are received at the same time and often the doctors may need to interpret multiple results in order to provide you with the best plan of care or course of treatment. Therefore, we ask that you please give u[place]2 business days to thoroughly review all your results before contacting the office for clarification. Should we see a critical lab result, you will be contacted sooner.   If You Need Anything After Your Visit  If you have any questions or concerns for your doctor, please call our main line at [phone] press option 4 to reach your doctor's medical  assistant. If no one answers, please leave a voicemail as directed and we will return your call as soon as possible. Messages left after 4 pm will be answered the following business day.   You may also send [place] message via [place] We typically respond to MyChart messages within 1-2 business days.  For prescription refills, please ask your pharmacy to contact our office. Our fax number is [phone]  If you have an urgent issue when the clinic is closed that cannot wait until the next business day, you can page your doctor at the number below.    Please note that while we do our best to be available for urgent issues outside of office hours, we are not available 24/7.   If you have an urgent issue and are unable to reach u[place] you may choose to seek medical care at your doctor's office,  retail clinic, urgent care center, or emergency room.  If you have a medical emergency, please immediately call 911 or go to the emergency department.  Pager Numbers  - Dr. Nehemiah Massed: [phone]  - Dr. Laurence Ferrari: [phone]  - Dr. Nicole Kindred: [phone]  In the event of inclement weather, please call our main line at [phone] for an update on the status of any delays or closures.  Dermatology Medication Tips: Please keep the boxes that topical medications come in in order to help keep track of the instructions about where and how to use these. Pharmacies typically print the medication instructions only on the boxes and not directly on the medication tubes.   If your medication is too expensive, please contact our office at [phone] option 4 or send [place] a message through [place].   We are unable to tell what your co-pay for medications will be in advance as this is different depending on your insurance coverage. However, we may be able to find a substitute medication at lower cost or fill out paperwork to get insurance to cover a needed medication.   If a prior authorization is required to get your  medication covered by your insurance company, please allow [place] 1-2 business days to complete this process.  Drug prices often vary depending on where the prescription is filled and some pharmacies may offer cheaper prices.  The website www.goodrx.com contains coupons for medications through different pharmacies. The prices here do not account for what the cost may be with help from insurance (it may be cheaper with your insurance), but the website can give you the price if you did not use any insurance.  - You can print the associated coupon and take it with your prescription to the pharmacy.  - You may also stop by our office during regular business hours and pick up a GoodRx coupon card.  - If you need your prescription sent electronically to a different pharmacy, notify our office through [facility] or by phone at [phone] option 4.     Si Usted Necesita Algo Despus de Su Visita  Tambin puede enviarnos un mensaje a travs de Pharmacist, community. Por lo general respondemos a los mensajes de MyChart en el transcurso de 1 a 2 das hbiles.  Para renovar recetas, por favor pida a su farmacia que se ponga en contacto con nuestra oficina. Harland Dingwall de fax es [place] [phone].  Si tiene un asunto urgente cuando la clnica est cerrada y que no puede esperar hasta el siguiente da hbil, puede llamar/localizar a su doctor(a) al nmero que aparece a continuacin.   Por favor, tenga en cuenta que aunque hacemos todo lo posible para estar disponibles para asuntos urgentes fuera del horario de [place], no estamos disponibles las 24 horas del da, los 7 das de la [place].   Si tiene un problema urgente y no puede comunicarse con nosotros, puede optar por buscar atencin mdica  en el consultorio de su doctor(a), en una clnica privada, en un centro de atencin urgente o en una sala de emergencias.  Si tiene Engineering geologist, por favor llame inmediatamente al 911 o vaya a la sala de  emergencias.  Nmeros de bper  - Dr. Nehemiah Massed: [phone]  - Dra. Moye: [phone]  - Dra. Nicole Kindred: [phone]  En caso de inclemencias del [place], por favor llame a Johnsie Kindred principal al [phone] para una actualizacin sobre el [place] de cualquier retraso o cierre.  Consejos para la medicacin en dermatologa: Por favor, guarde  las cajas en las que vienen los medicamentos de uso tpico para ayudarle a seguir las instrucciones sobre dnde y cmo usarlos. Las farmacias generalmente imprimen las instrucciones del medicamento slo en las cajas y no directamente en los tubos del [place].   Si su medicamento es muy caro, por favor, pngase en contacto con Zigmund Daniel llamando al [phone] y presione la opcin 4 o envenos un mensaje a travs de Pharmacist, community.   No podemos decirle cul ser su copago por los medicamentos por adelantado ya que esto es diferente dependiendo de la cobertura de su seguro. Sin embargo, es posible que podamos encontrar un medicamento sustituto a Electrical engineer un formulario para que el seguro cubra el medicamento que se considera necesario.   Si se requiere una autorizacin previa para que su compaa de seguros [place] su medicamento, por favor permtanos de 1 a 2 das hbiles para completar este proceso.  Los precios de los medicamentos varan con frecuencia dependiendo del Environmental consultant de dnde se surte la receta y alguna farmacias pueden ofrecer precios ms baratos.  El sitio web www.goodrx.com tiene cupones para medicamentos de Airline pilot. Los precios aqu no tienen en cuenta lo que podra costar con la ayuda del seguro (puede ser ms barato con su seguro), pero el sitio web puede darle el precio si no utiliz Research scientist (physical sciences).  - Puede imprimir el cupn correspondiente y llevarlo con su receta a la farmacia.  - Tambin puede pasar por nuestra oficina durante el horario de atencin regular y Charity fundraiser una tarjeta de cupones de GoodRx.  - Si  necesita que su receta se enve electrnicamente a una farmacia diferente, informe a nuestra oficina a travs de MyChart de [place] o por telfono llamando al [phone] y presione la opcin 4.

## 2021-10-17 NOTE — Progress Notes (Unsigned)
New Patient Visit  Subjective  Kathy Pacheco is a 88 y.o. female who presents for the following: Annual Exam (Hx of Aks. Areas of concern). The patient presents for Total-Body Skin Exam (TBSE) for skin cancer screening and mole check.  The patient has spots, moles and lesions to be evaluated, some may be new or changing and the patient has concerns that these could be cancer.  Daughter with patient and contributes to history..   Objective  Well appearing patient in no apparent distress; mood and affect are within normal limits.  A full examination was performed including scalp, head, eyes, ears, nose, lips, neck, chest, axillae, abdomen, back, buttocks, bilateral upper extremities, bilateral lower extremities, hands, feet, fingers, toes, fingernails, and toenails. All findings within normal limits unless otherwise noted below.  Left Posterior Neck 0.7 cm Subcutaneous nodule.   Right Superior Forehead 0.7 cm brown papule     Right Temple x1 Erythematous keratotic or waxy stuck-on papule or plaque.  shoulders Multiple excoriations   Assessment & Plan   Lentigines - Scattered tan macules - Due to sun exposure - Benign-appearing, observe - Recommend daily broad spectrum sunscreen SPF 30+ to sun-exposed areas, reapply every 2 hours as needed. - Call for any changes  Seborrheic Keratoses - Stuck-on, waxy, tan-brown papules and/or plaques  - Benign-appearing - Discussed benign etiology and prognosis. - Observe - Call for any changes  Melanocytic Nevi - Tan-brown and/or pink-flesh-colored symmetric macules and papules - Benign appearing on exam today - Observation - Call clinic for new or changing moles - Recommend daily use of broad spectrum spf 30+ sunscreen to sun-exposed areas.   Hemangiomas - Red papules - Discussed benign nature - Observe - Call for any changes  Actinic Damage - Chronic condition, secondary to cumulative UV/sun exposure - diffuse scaly  erythematous macules with underlying dyspigmentation - Recommend daily broad spectrum sunscreen SPF 30+ to sun-exposed areas, reapply every 2 hours as needed.  - Staying in the shade or wearing long sleeves, sun glasses (UVA+UVB protection) and wide brim hats (4-inch brim around the entire circumference of the hat) are also recommended for sun protection.  - Call for new or changing lesions.  Skin cancer screening performed today.  Epidermal inclusion cyst Left Posterior Neck Benign-appearing. Exam most consistent with an epidermal inclusion cyst. Discussed that a cyst is a benign growth that can grow over time and sometimes get irritated or inflamed. Recommend observation if it is not bothersome. Discussed option of surgical excision to remove it if it is growing, symptomatic, or other changes noted. Please call for new or changing lesions so they can be evaluated.  Neoplasm of skin Right Superior Forehead Epidermal / dermal shaving  Lesion diameter (cm):  0.7 Informed consent: discussed and consent obtained   Timeout: patient name, date of birth, surgical site, and procedure verified   Procedure prep:  Patient was prepped and draped in usual sterile fashion Prep type:  Isopropyl alcohol Anesthesia: the lesion was anesthetized in a standard fashion   Anesthetic:  1% lidocaine w/ epinephrine 1-100,000 buffered w/ 8.4% NaHCO3 Instrument used: flexible razor blade   Hemostasis achieved with: pressure, aluminum chloride and electrodesiccation   Outcome: patient tolerated procedure well   Post-procedure details: sterile dressing applied and wound care instructions given   Dressing type: bandage and petrolatum    Specimen 1 - Surgical pathology Differential Diagnosis: Irritated nevus vs ISK, R/O dysplasia Check Margins: No  Inflamed seborrheic keratosis Right Temple x1 Symptomatic, irritating, patient  would like treated. Destruction of lesion - Right Temple x1 Complexity: simple    Destruction method: cryotherapy   Informed consent: discussed and consent obtained   Timeout:  patient name, date of birth, surgical site, and procedure verified Lesion destroyed using liquid nitrogen: Yes   Region frozen until ice ball extended beyond lesion: Yes   Outcome: patient tolerated procedure well with no complications   Post-procedure details: wound care instructions given   Additional details:  Prior to procedure, discussed risks of blister formation, small wound, skin dyspigmentation, or rare scar following cryotherapy. Recommend Vaseline ointment to treated areas while healing.  Atopic neurodermatitis Shoulders Atopic dermatitis (eczema) is a chronic, relapsing, pruritic condition that can significantly affect quality of life. It is often associated with allergic rhinitis and/or asthma and can require treatment with topical medications, phototherapy, or in severe cases biologic injectable medication (Dupixent; Adbry) or Oral JAK inhibitors. Start Triamcinolone 0.1% cream twice daily to affected areas on body up to 2 weeks as needed for itching. Avoid applying to face, groin, and axilla. Use as directed. Long-term use can cause thinning of the skin. Topical steroids (such as triamcinolone, fluocinolone, fluocinonide, mometasone, clobetasol, halobetasol, betamethasone, hydrocortisone) can cause thinning and lightening of the skin if they are used for too long in the same area. Your physician has selected the right strength medicine for your problem and area affected on the body. Please use your medication only as directed by your physician to prevent side effects.   Skin care information given  triamcinolone cream (KENALOG) 0.1 % - shoulders Apply twice daily to affected areas on body up to 2 weeks as needed for itching. Avoid applying to face, groin, and axilla.  Return in about 1 year (around 10/18/2022) for TBSE.  I, Emelia Salisbury, CMA, am acting as scribe for Sarina Ser,  MD. Documentation: I have reviewed the above documentation for accuracy and completeness, and I agree with the above.  Sarina Ser, MD

## 2021-10-19 ENCOUNTER — Encounter: Payer: Self-pay | Admitting: Dermatology

## 2021-10-19 DIAGNOSIS — K2100 Gastro-esophageal reflux disease with esophagitis, without bleeding: Secondary | ICD-10-CM | POA: Diagnosis not present

## 2021-10-19 DIAGNOSIS — R110 Nausea: Secondary | ICD-10-CM | POA: Diagnosis not present

## 2021-10-20 ENCOUNTER — Telehealth: Payer: Self-pay

## 2021-10-20 NOTE — Telephone Encounter (Signed)
Patient's daughter [name] informed of pathology results.

## 2021-10-20 NOTE — Telephone Encounter (Signed)
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

## 2021-11-09 ENCOUNTER — Encounter: Payer: Medicare Other | Admitting: Family Medicine

## 2021-11-10 NOTE — Progress Notes (Signed)
I,[name],acting as a Education administrator for [employer], PA-C.,have documented all relevant documentation on the behalf of [name], PA-C,as directed by  [name], PA-C while in the presence of [name], PA-C.   Complete physical exam   Patient: Kathy Pacheco   DOB: 1931-08-30   88 y.o. Female  MRN: [id number] Visit Date: 11/11/2021  Today's healthcare provider: [name], PA-C   Cc. Cpe   Subjective    Kathy Pacheco is a 88 y.o. female who presents today for a complete physical exam.  She reports consuming a general and mainly fast food  diet.  The patent reports walking to mailbox and back daily as her form of exercise.  She generally feels well. She reports sleeping well. She does not have additional problems to discuss today.  HPI     Past Medical History:  Diagnosis Date   Anxiety    Depression    Diverticulosis    GERD (gastroesophageal reflux disease)    Hyperlipidemia    Hypertension    PONV (postoperative nausea and vomiting)    PVD (peripheral vascular disease) ([place])    Sleep apnea    Past Surgical History:  Procedure Laterality Date   CATARACT EXTRACTION Bilateral    COLONOSCOPY     ESOPHAGOGASTRODUODENOSCOPY (EGD) WITH PROPOFOL N/A 09/10/2020   Procedure: ESOPHAGOGASTRODUODENOSCOPY (EGD) WITH PROPOFOL;  Surgeon: Robert Bellow, MD;  Location: [facility];  Service: Endoscopy;  Laterality: N/A;   ETHMOIDECTOMY Bilateral 03/10/2016   Procedure: ETHMOIDECTOMY;  Surgeon: Beverly Gust, MD;  Location: [place];  Service: ENT;  Laterality: Bilateral;   FRONTAL SINUS EXPLORATION Bilateral 03/10/2016   Procedure: FRONTAL WITH TISSUE REMOVAL;  Surgeon: Beverly Gust, MD;  Location: [place];  Service: ENT;  Laterality: Bilateral;   HERNIA REPAIR     IMAGE GUIDED SINUS SURGERY Bilateral 03/10/2016   Procedure: IMAGE GUIDED SINUS SURGERY;  Surgeon: Beverly Gust, MD;  Location: [place];  Service:  ENT;  Laterality: Bilateral;  Need Disk GAVE DISK TO CECE 1-25 KP   MAXILLARY ANTROSTOMY Bilateral 03/10/2016   Procedure: MAXILLARY ANTROSTOMY;  Surgeon: Beverly Gust, MD;  Location: [place];  Service: ENT;  Laterality: Bilateral;   NASAL TURBINATE REDUCTION Bilateral 03/10/2016   Procedure: TURBINATE REDUCTION/SUBMUCOSAL RESECTION;  Surgeon: Beverly Gust, MD;  Location: [place];  Service: ENT;  Laterality: Bilateral;   SEPTOPLASTY Bilateral 03/10/2016   Procedure: SEPTOPLASTY;  Surgeon: Beverly Gust, MD;  Location: [place];  Service: ENT;  Laterality: Bilateral;   SPHENOIDECTOMY Bilateral 03/10/2016   Procedure: Coralee Pesa WITH TISSUE REMOVAL;  Surgeon: Beverly Gust, MD;  Location: [place];  Service: ENT;  Laterality: Bilateral;   Social History   Socioeconomic History   Marital status: Widowed    Spouse name: Not on file   Number of children: 1   Years of education: Not on file   Highest education level: High school graduate  Occupational History   Occupation: retired  Tobacco Use   Smoking status: Never   Smokeless tobacco: Never  Vaping Use   Vaping Use: Never used  Substance and Sexual Activity   Alcohol use: No    Alcohol/week: 0.0 standard drinks of alcohol   Drug use: No   Sexual activity: Not on file  Other Topics Concern   Not on file  Social History Narrative   Not on file   Social Determinants of Health   Financial Resource Strain: Low Risk  (02/23/2021)  Overall Financial Resource Strain (CARDIA)    Difficulty of Paying Living Expenses: Not hard at all  Food Insecurity: No Food Insecurity (02/23/2021)   Hunger Vital Sign    Worried About Running Out of Food in the Last Year: Never true    Ran Out of Food in the Last Year: Never true  Transportation Needs: No Transportation Needs (02/23/2021)   PRAPARE - Hydrologist (Medical): No    Lack of Transportation  (Non-Medical): No  Physical Activity: Insufficiently Active (02/23/2021)   Exercise Vital Sign    Days of Exercise per Week: 3 days    Minutes of Exercise per Session: 20 min  Stress: No Stress Concern Present (02/23/2021)   [place]    Feeling of Stress : Not at all  Social Connections: Moderately Integrated (02/23/2021)   Social Connection and Isolation Panel [NHANES]    Frequency of Communication with Friends and Family: More than three times a week    Frequency of Social Gatherings with Friends and Family: More than three times a week    Attends Religious Services: More than 4 times per year    Active Member of [employer] or Organizations: Yes    Attends Archivist Meetings: More than 4 times per year    Marital Status: Widowed  Intimate Partner Violence: Not At Risk (02/23/2021)   Humiliation, Afraid, Rape, and Kick questionnaire    Fear of Current or Ex-Partner: No    Emotionally Abused: No    Physically Abused: No    Sexually Abused: No   Family Status  Relation Name Status   Mother  Deceased at age 70   Father  Deceased   Sister  Alive   Sister  Deceased   Brother 1 Alive   Brother 2 Deceased   Brother 74 Alive   Brother 26 Alive   Brother 31 Alive   MGM  (Not Specified)   Family History  Problem Relation Age of Onset   Epilepsy Mother    Osteoporosis Mother    Coronary artery disease Mother    COPD Father    Hypertension Father    CVA Father    Allergies Father    Lung cancer Father    Emphysema Father    Cancer Sister        breast   Breast cancer Sister 41   Osteoporosis Sister    COPD Sister    Coronary artery disease Brother    CVA Brother    COPD Brother    Heart disease Brother    Arthritis Brother    CVA Brother    Cancer Brother        cancer   Healthy Brother    Healthy Brother    Cancer Maternal Grandmother    Hypertension Maternal Grandmother    Allergies  Allergen  Reactions   Ciprofloxacin Itching    Patient Care Team: [name], PA-C as PCP - General (Physician Assistant) Beverly Gust, MD (Otolaryngology) Dingeldein, Remo Lipps, MD (Ophthalmology) Ubaldo Glassing Javier Docker, MD as Consulting Physician (Cardiology) Marinus Maw., MD (Dentistry)   Medications: Outpatient Medications Prior to Visit  Medication Sig   acetaminophen (TYLENOL) 500 MG tablet Take 500 mg by mouth every 6 (six) hours as needed for mild pain.   aspirin EC 81 MG tablet Take 81 mg by mouth daily. Swallow whole.   famotidine (PEPCID) 40 MG tablet Take 40 mg by mouth at bedtime.  ondansetron (ZOFRAN ODT) 4 MG disintegrating tablet Allow 1-2 tablets to dissolve in your mouth every 8 hours as needed for nausea/vomiting   pantoprazole (PROTONIX) 40 MG tablet 40 mg 2 (two) times daily. 30 minutes before meals   rosuvastatin (CRESTOR) 10 MG tablet TAKE 1 TABLET BY MOUTH EVERY DAY   triamcinolone cream (KENALOG) 0.1 % Apply twice daily to affected areas on body up to 2 weeks as needed for itching. Avoid applying to face, groin, and axilla.   [DISCONTINUED] amLODipine (NORVASC) 5 MG tablet TAKE 1 TABLET (5 MG TOTAL) BY MOUTH DAILY.   [DISCONTINUED] albuterol (VENTOLIN HFA) 108 (90 Base) MCG/ACT inhaler Inhale 2 puffs into the lungs every 6 (six) hours as needed for wheezing or shortness of breath. (Patient not taking: Reported on 11/29/2020)   [DISCONTINUED] busPIRone (BUSPAR) 30 MG tablet Take 30 mg by mouth in the morning and at bedtime. (Patient not taking: Reported on 11/29/2020)   [DISCONTINUED] calcium carbonate (OS-CAL) 600 MG TABS tablet Take 600 mg by mouth 2 (two) times daily with a meal. (Patient not taking: Reported on 11/29/2020)   [DISCONTINUED] cetirizine (ZYRTEC ALLERGY) 10 MG tablet Take 1 tablet (10 mg total) by mouth daily. (Patient not taking: Reported on 11/29/2020)   [DISCONTINUED] donepezil (ARICEPT) 5 MG tablet Take 1 tablet (5 mg total) by mouth at bedtime.  (Patient not taking: Reported on 11/11/2021)   [DISCONTINUED] fluticasone (FLONASE) 50 MCG/ACT nasal spray Place 1 spray into both nostrils daily as needed for allergies or rhinitis. (Patient not taking: Reported on 11/11/2021)   [DISCONTINUED] fluticasone-salmeterol (ADVAIR) 500-50 MCG/ACT AEPB Inhale 1 puff into the lungs as needed. (Patient not taking: Reported on 11/29/2020)   [DISCONTINUED] meclizine (ANTIVERT) 25 MG tablet Take 25 mg by mouth 3 (three) times daily as needed for dizziness. (Patient not taking: Reported on 11/29/2020)   No facility-administered medications prior to visit.    Review of Systems  HENT:  Positive for hearing loss and rhinorrhea.   Gastrointestinal:  Positive for nausea.  Skin:  Positive for rash.    Objective    Blood pressure (!) 121/57, pulse 84, height '5\' 2"'$  (1.575 m), weight 126 lb 6.4 oz (57.3 kg), SpO2 98 %.   Physical Exam Constitutional:      General: She is awake.     Appearance: She is well-developed.  HENT:     Head: Normocephalic.  Eyes:     Conjunctiva/sclera: Conjunctivae normal.  Cardiovascular:     Rate and Rhythm: Normal rate and regular rhythm.     Heart sounds: Normal heart sounds.  Pulmonary:     Effort: Pulmonary effort is normal.     Breath sounds: Wheezing present.     Comments: Slight wheezing bases of b/l lower lungs Musculoskeletal:     Right lower leg: No edema.     Left lower leg: No edema.  Skin:    General: Skin is warm.  Neurological:     Mental Status: She is alert and oriented to person, place, and time.  Psychiatric:        Attention and Perception: Attention normal.        Mood and Affect: Mood normal.        Speech: Speech normal.        Behavior: Behavior is cooperative.      Last depression screening scores    11/11/2021    2:05 PM 05/17/2021   11:43 AM 02/23/2021    1:46 PM  PHQ 2/9 Scores  PHQ -  2 Score 0 0 0  PHQ- 9 Score 1 1    Last fall risk screening    11/11/2021    3:35 PM  [place] in the past year? 0  Number falls in past yr: 0  Injury with Fall? 0   Last Audit-C alcohol use screening    11/11/2021    2:05 PM  Alcohol Use Disorder Test (AUDIT)  1. How often do you have a drink containing alcohol? 0  2. How many drinks containing alcohol do you have on a typical day when you are drinking? 0  3. How often do you have six or more drinks on one occasion? 0  AUDIT-C Score 0   A score of 3 or more in women, and 4 or more in men indicates increased risk for alcohol abuse, EXCEPT if all of the points are from question 1   No results found for any visits on 11/11/21.  Assessment & Plan    Routine Health Maintenance and Physical Exam  Exercise Activities and Dietary recommendations --balanced diet high in fiber and protein, low in sugars, carbs, fats. --physical activity/exercise 30 minutes 3-5 times a week     Immunization History  Administered Date(s) Administered   Fluad Quad(high Dose 65+) 10/22/2019, 11/29/2020   Influenza Split 12/02/2005, 11/19/2010, 12/02/2011   Influenza, High Dose Seasonal PF 10/28/2013, 11/04/2014, 11/04/2015, 11/22/2016, 11/21/2017, 10/30/2018   Influenza,inj,Quad PF,6+ Mos 11/02/2012   PFIZER(Purple Top)SARS-COV-2 Vaccination 02/27/2019, 03/20/2019, 03/10/2020   Pneumococcal Conjugate-13 08/04/2013   Pneumococcal Polysaccharide-23 05/02/2009   Td 09/10/2007   Tdap 09/20/2020    Health Maintenance  Topic Date Due   Zoster Vaccines- Shingrix (1 of 2) Never done   COVID-19 Vaccine (4 - Pfizer risk series) 05/05/2020   INFLUENZA VACCINE  09/06/2021   DEXA SCAN  11/12/2022 (Originally 04/03/2020)   TETANUS/TDAP  09/21/2030   Pneumonia Vaccine 82+ Years old  Completed   HPV VACCINES  Aged Out    Discussed health benefits of physical activity, and encouraged her to engage in regular exercise appropriate for her age and condition.  Problem List Items Addressed This Visit       Cardiovascular and Mediastinum   Essential  hypertension - Primary    unmedicated but not hypertensive today Ordered cmp       Relevant Medications   amLODipine (NORVASC) 5 MG tablet   Other Relevant Orders   Comprehensive Metabolic Panel (CMET)     Respiratory   COPD, mild (HCC)    Dx on chart -- some wheezing today pt claims to feel fine Refilled albuterol to use prn wheezing or before choir       Relevant Medications   albuterol (VENTOLIN HFA) 108 (90 Base) MCG/ACT inhaler     Digestive   Acid reflux    Stable w/ flares only related to diet Advised can take tums -- will help acid reflux and act as a calcium supplement        Musculoskeletal and Integument   Osteoporosis    Discussed repeating dxa scan vs d/c -- pt opts to dc Pt not open to further addition of meds, discussed adding tums which will help acid reflux and help supplement calcium a bit        Other   Absolute anemia    Will repeat cbc for stability May be 2/2 htn ckd      Relevant Orders   CBC w/Diff/Platelet   Hypercholesteremia    Needs repeat of  lipids fasting Managed with crestor 10 mg and asa 81 mg      Relevant Medications   amLODipine (NORVASC) 5 MG tablet   Other Relevant Orders   Lipid Profile   MCI (mild cognitive impairment)    mmse 30/30 Pt no longer takes donepezil       Other Visit Diagnoses     Hyperglycemia       Relevant Orders   HgB A1c        Return in about 6 months (around 05/13/2022).     I, [name], PA-C have reviewed all documentation for this visit. The documentation on  11/11/2021 for the exam, diagnosis, procedures, and orders are all accurate and complete.  [name], PA-C [facility] [address] [place], [place], [address] Office: [phone] Fax: [place]

## 2021-11-11 ENCOUNTER — Ambulatory Visit (INDEPENDENT_AMBULATORY_CARE_PROVIDER_SITE_OTHER): Payer: Medicare Other | Admitting: Physician Assistant

## 2021-11-11 ENCOUNTER — Encounter: Payer: Self-pay | Admitting: Physician Assistant

## 2021-11-11 VITALS — BP 121/57 | HR 84 | Ht 62.0 in | Wt 126.4 lb

## 2021-11-11 DIAGNOSIS — D649 Anemia, unspecified: Secondary | ICD-10-CM

## 2021-11-11 DIAGNOSIS — R739 Hyperglycemia, unspecified: Secondary | ICD-10-CM | POA: Diagnosis not present

## 2021-11-11 DIAGNOSIS — M810 Age-related osteoporosis without current pathological fracture: Secondary | ICD-10-CM | POA: Diagnosis not present

## 2021-11-11 DIAGNOSIS — K219 Gastro-esophageal reflux disease without esophagitis: Secondary | ICD-10-CM

## 2021-11-11 DIAGNOSIS — G3184 Mild cognitive impairment, so stated: Secondary | ICD-10-CM | POA: Diagnosis not present

## 2021-11-11 DIAGNOSIS — J449 Chronic obstructive pulmonary disease, unspecified: Secondary | ICD-10-CM

## 2021-11-11 DIAGNOSIS — F03A0 Unspecified dementia, mild, without behavioral disturbance, psychotic disturbance, mood disturbance, and anxiety: Secondary | ICD-10-CM | POA: Insufficient documentation

## 2021-11-11 DIAGNOSIS — I10 Essential (primary) hypertension: Secondary | ICD-10-CM

## 2021-11-11 DIAGNOSIS — E7800 Pure hypercholesterolemia, unspecified: Secondary | ICD-10-CM

## 2021-11-11 MED ORDER — AMLODIPINE BESYLATE 5 MG PO TABS: 5.0000 mg | ORAL_TABLET | Freq: Every day | ORAL | 1 refills | Status: DC

## 2021-11-11 MED ORDER — ALBUTEROL SULFATE HFA 108 (90 BASE) MCG/ACT IN AERS: 2.0000 | INHALATION_SPRAY | Freq: Four times a day (QID) | RESPIRATORY_TRACT | 2 refills | Status: AC | PRN

## 2021-11-11 NOTE — Assessment & Plan Note (Addendum)
mmse 30/30 Pt no longer takes donepezil

## 2021-11-11 NOTE — Assessment & Plan Note (Signed)
Discussed repeating dxa scan vs d/c -- pt opts to dc Pt not open to further addition of meds, discussed adding tums which will help acid reflux and help supplement calcium a bit

## 2021-11-11 NOTE — Assessment & Plan Note (Signed)
Needs repeat of lipids fasting Managed with crestor 10 mg and asa 81 mg

## 2021-11-11 NOTE — Assessment & Plan Note (Signed)
Dx on chart -- some wheezing today pt claims to feel fine Refilled albuterol to use prn wheezing or before choir

## 2021-11-11 NOTE — Assessment & Plan Note (Addendum)
unmedicated but not hypertensive today Ordered cmp

## 2021-11-11 NOTE — Assessment & Plan Note (Addendum)
Will repeat cbc for stability May be 2/2 htn ckd

## 2021-11-11 NOTE — Assessment & Plan Note (Signed)
Stable w/ flares only related to diet Advised can take tums -- will help acid reflux and act as a calcium supplement

## 2021-11-16 ENCOUNTER — Ambulatory Visit (INDEPENDENT_AMBULATORY_CARE_PROVIDER_SITE_OTHER): Payer: Medicare Other | Admitting: Physician Assistant

## 2021-11-16 DIAGNOSIS — Z23 Encounter for immunization: Secondary | ICD-10-CM | POA: Diagnosis not present

## 2021-11-16 DIAGNOSIS — D649 Anemia, unspecified: Secondary | ICD-10-CM | POA: Diagnosis not present

## 2021-11-16 DIAGNOSIS — R739 Hyperglycemia, unspecified: Secondary | ICD-10-CM | POA: Diagnosis not present

## 2021-11-16 DIAGNOSIS — E7800 Pure hypercholesterolemia, unspecified: Secondary | ICD-10-CM | POA: Diagnosis not present

## 2021-11-16 DIAGNOSIS — I10 Essential (primary) hypertension: Secondary | ICD-10-CM | POA: Diagnosis not present

## 2021-11-17 LAB — COMPREHENSIVE METABOLIC PANEL
ALT: 5 IU/L (ref 0–32)
AST: 12 IU/L (ref 0–40)
Albumin/Globulin Ratio: 1.4 (ref 1.2–2.2)
Albumin: 3.9 g/dL (ref 3.7–4.7)
Alkaline Phosphatase: 98 IU/L (ref 44–121)
BUN/Creatinine Ratio: 15 (ref 12–28)
BUN: 16 mg/dL (ref 8–27)
Bilirubin Total: 0.3 mg/dL (ref 0.0–1.2)
CO2: 24 mmol/L (ref 20–29)
Calcium: 9.6 mg/dL (ref 8.7–10.3)
Chloride: 103 mmol/L (ref 96–106)
Creatinine, Ser: 1.09 mg/dL — ABNORMAL HIGH (ref 0.57–1.00)
Globulin, Total: 2.8 g/dL (ref 1.5–4.5)
Glucose: 89 mg/dL (ref 70–99)
Potassium: 3.7 mmol/L (ref 3.5–5.2)
Sodium: 140 mmol/L (ref 134–144)
Total Protein: 6.7 g/dL (ref 6.0–8.5)
eGFR: 49 mL/min/{1.73_m2} — ABNORMAL LOW (ref >59)

## 2021-11-17 LAB — LIPID PANEL
Chol/HDL Ratio: 3.2 ratio (ref 0.0–4.4)
Cholesterol, Total: 165 mg/dL (ref 100–199)
HDL: 51 mg/dL (ref >39)
LDL Chol Calc (NIH): 92 mg/dL (ref 0–99)
Triglycerides: 121 mg/dL (ref 0–149)
VLDL Cholesterol Cal: 22 mg/dL (ref 5–40)

## 2021-11-17 LAB — CBC WITH DIFFERENTIAL/PLATELET
Basophils Absolute: 0.1 10*3/uL (ref 0.0–0.2)
Basos: 1 %
EOS (ABSOLUTE): 0.4 10*3/uL (ref 0.0–0.4)
Eos: 6 %
Hematocrit: 34.3 % (ref 34.0–46.6)
Hemoglobin: 11.1 g/dL (ref 11.1–15.9)
Immature Grans (Abs): 0 10*3/uL (ref 0.0–0.1)
Immature Granulocytes: 0 %
Lymphocytes Absolute: 1.5 10*3/uL (ref 0.7–3.1)
Lymphs: 21 %
MCH: 27.5 pg (ref 26.6–33.0)
MCHC: 32.4 g/dL (ref 31.5–35.7)
MCV: 85 fL (ref 79–97)
Monocytes Absolute: 0.8 10*3/uL (ref 0.1–0.9)
Monocytes: 10 %
Neutrophils Absolute: 4.5 10*3/uL (ref 1.4–7.0)
Neutrophils: 62 %
Platelets: 309 10*3/uL (ref 150–450)
RBC: 4.03 x10E6/uL (ref 3.77–5.28)
RDW: 14.4 % (ref 11.7–15.4)
WBC: 7.3 10*3/uL (ref 3.4–10.8)

## 2021-11-17 LAB — HEMOGLOBIN A1C
Est. average glucose Bld gHb Est-mCnc: 123 mg/dL
Hgb A1c MFr Bld: 5.9 % — ABNORMAL HIGH (ref 4.8–5.6)

## 2022-01-07 IMAGING — DX DG CHEST 1V PORT
1 series · 1 of 1 positions shown · non-contrast
Comparison: 07/19/2017

CLINICAL DATA: Choking sensation, sinus drainage

EXAM:
PORTABLE CHEST 1 VIEW

[chest ap]
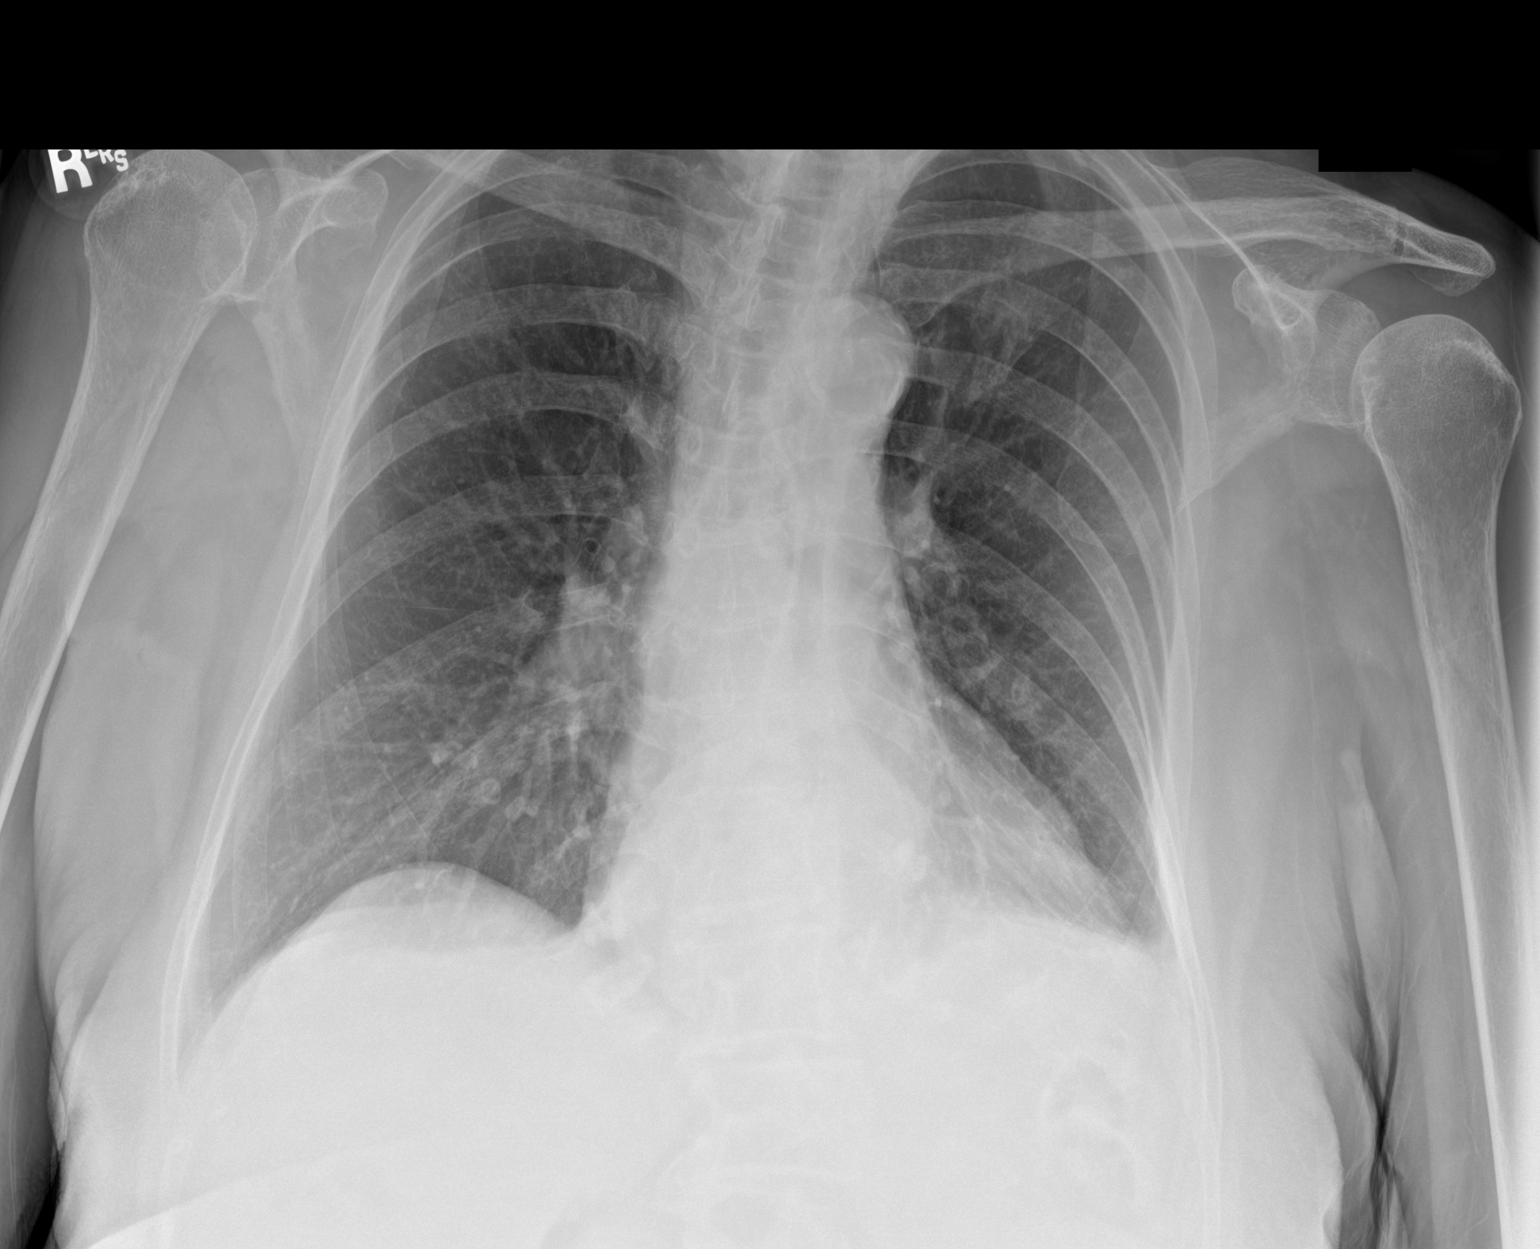

[1 of 1 positions shown; findings below may reference images not displayed]

FINDINGS: Single frontal view of the chest demonstrates a stable hiatal
hernia. The cardiac silhouette is unremarkable. No airspace disease,
effusion, or pneumothorax.
IMPRESSION: 1. Hiatal hernia.
2. No acute intrathoracic process.

## 2022-02-16 IMAGING — US US ABDOMEN COMPLETE
1 series · 14 of 25 positions shown · non-contrast
Comparison: 07/05/2011

CLINICAL DATA: Abdomen pain with nausea for 1 year

EXAM:
ABDOMEN ULTRASOUND COMPLETE

[Series 1: us abdomen complete · 0.18mm/px · 14 of 95 slices shown]
[im 1/95]
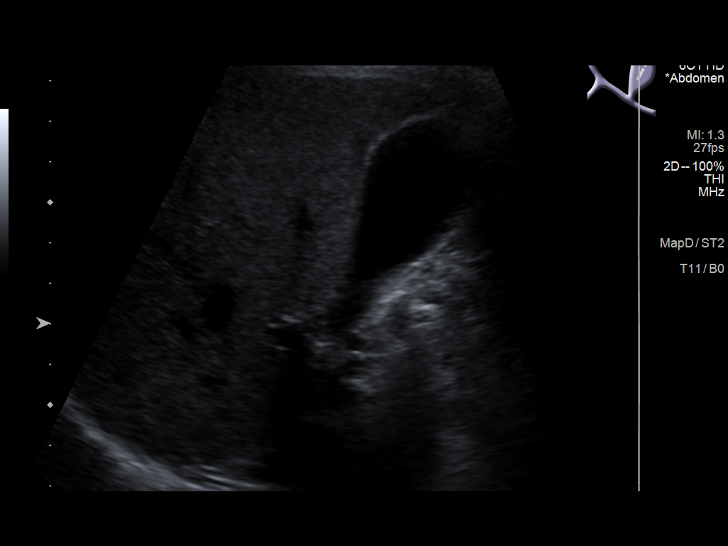
[im 8/95]
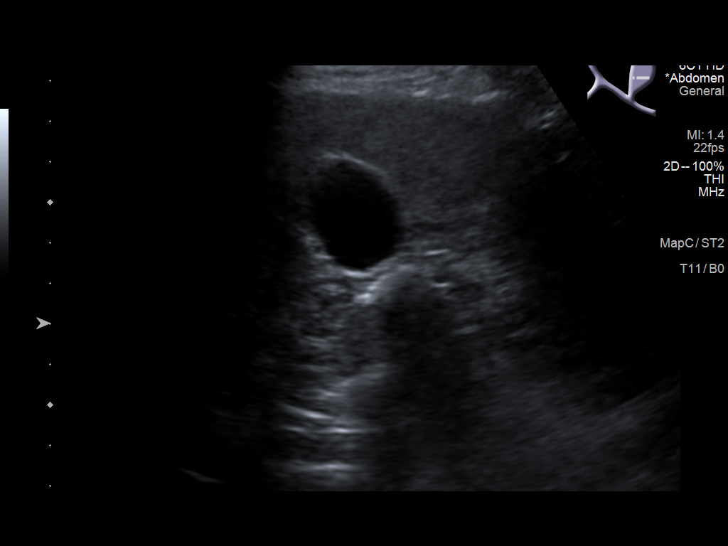
[im 16/95]
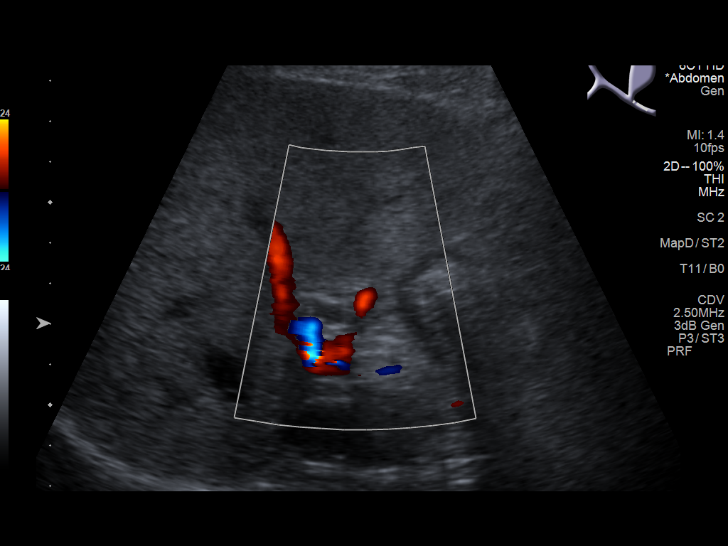
[im 24/95]
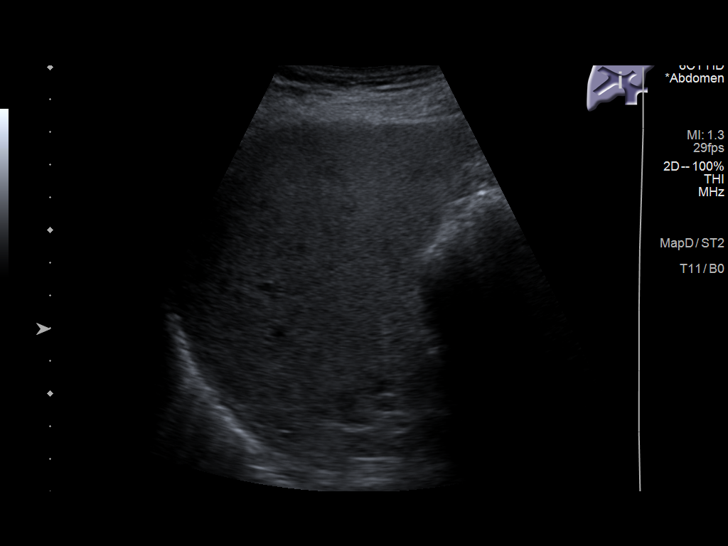
[im 32/95]
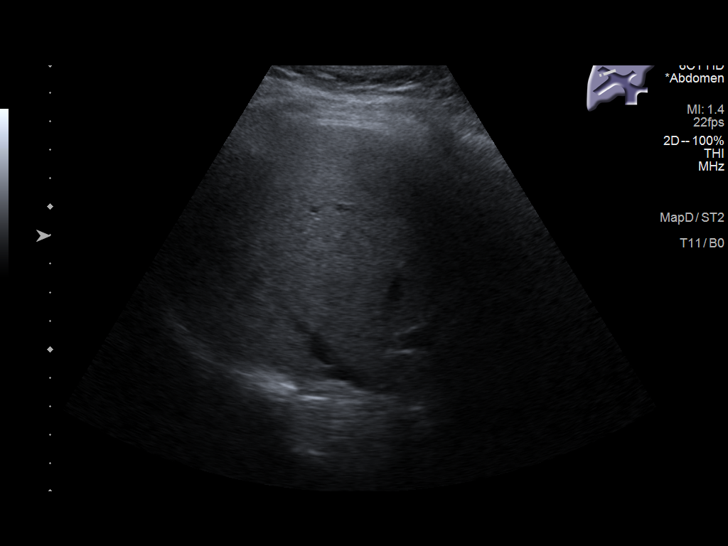
[im 36/95]
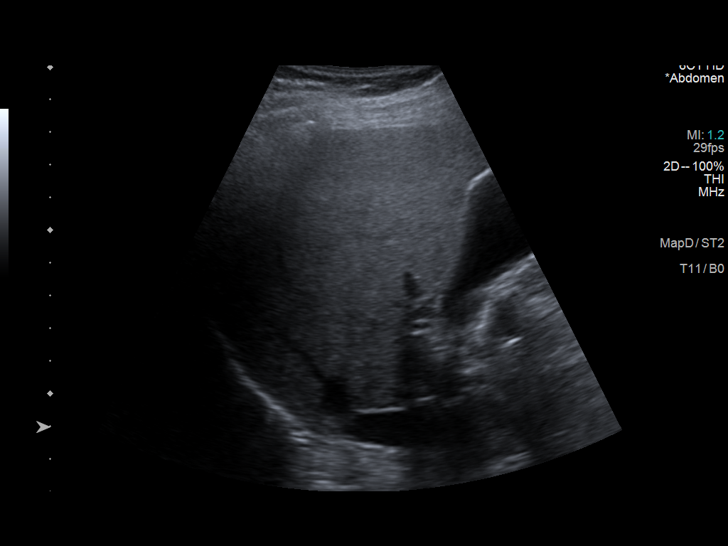
[im 44/95]
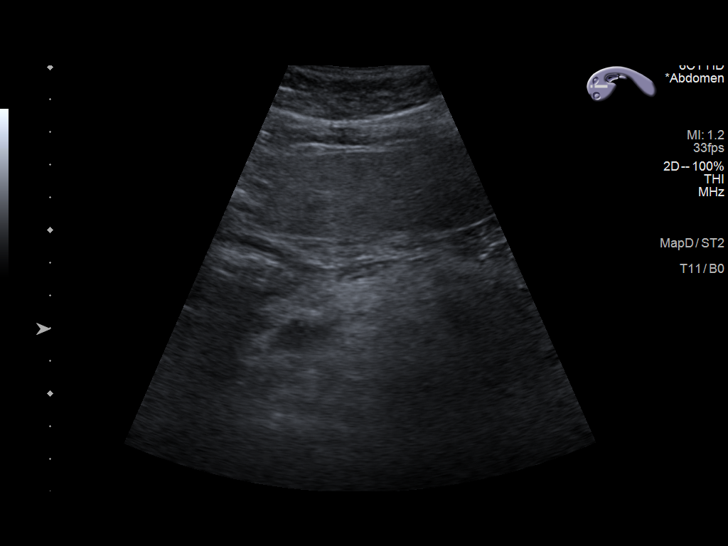
[im 51/95]
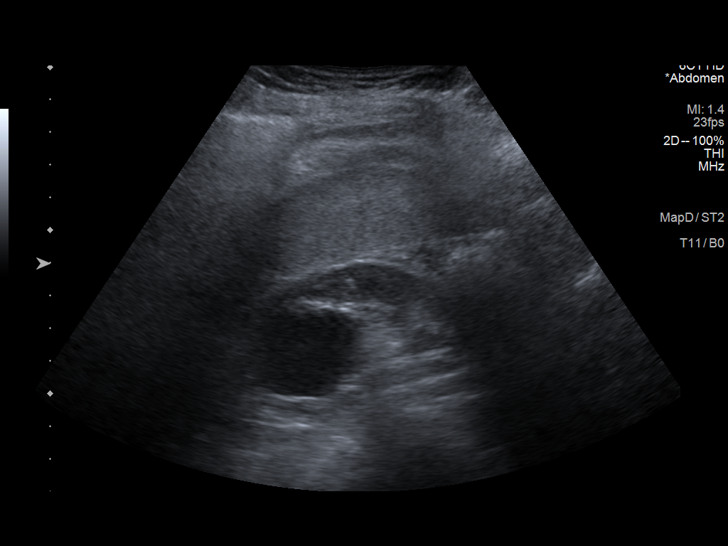
[im 59/95]
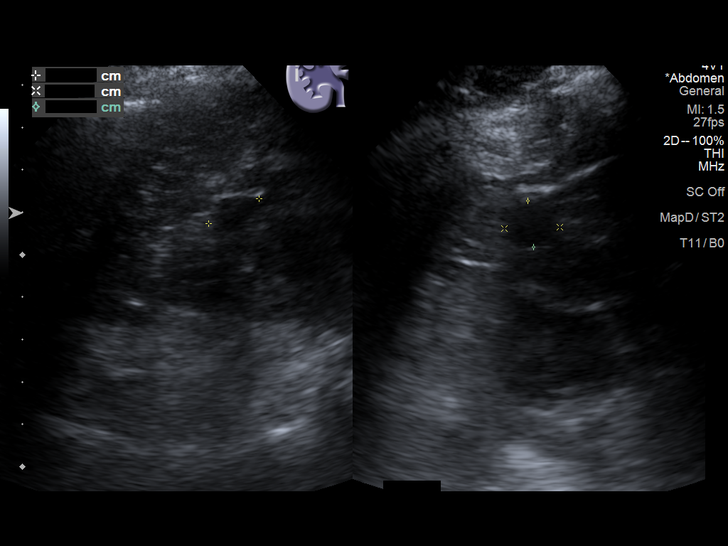
[im 63/95]
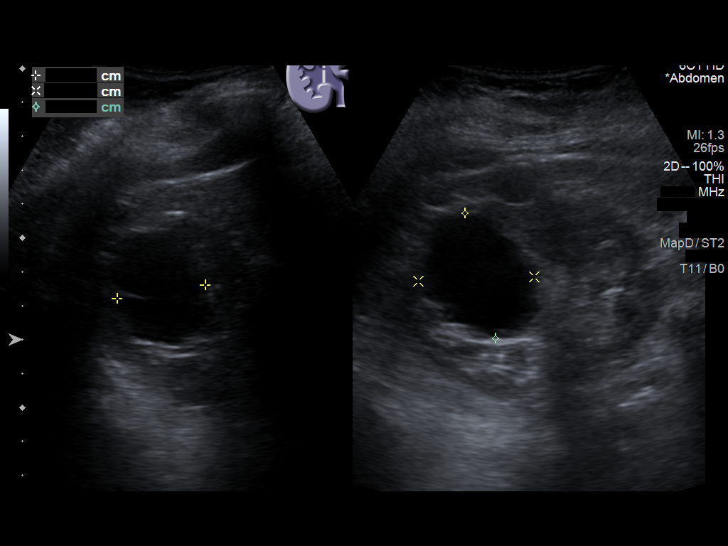
[im 71/95]
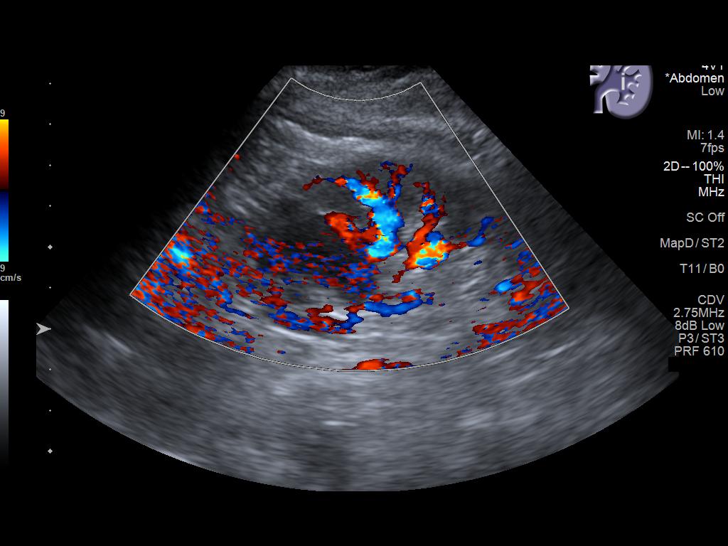
[im 79/95]
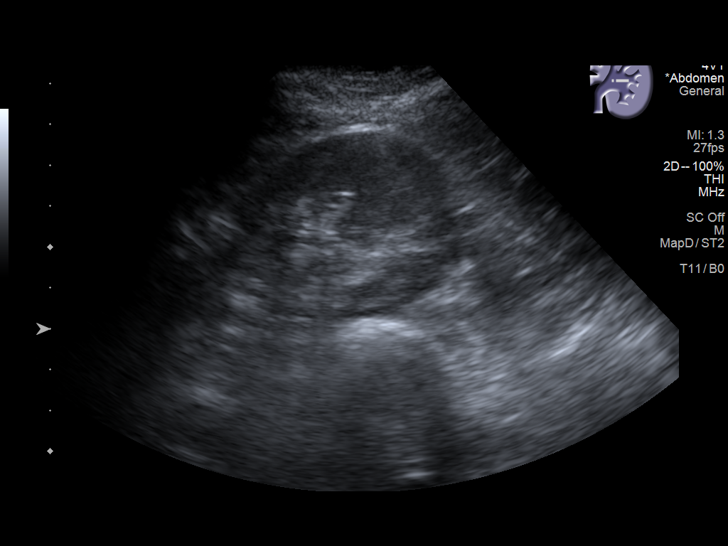
[im 87/95]
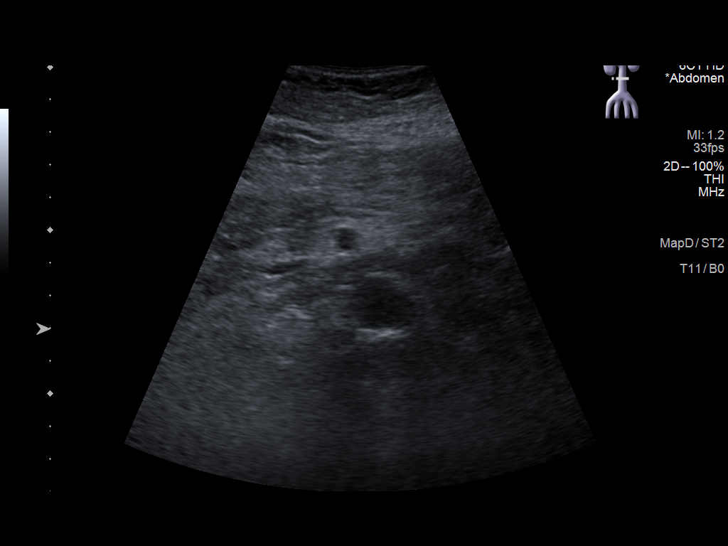
[im 95/95]
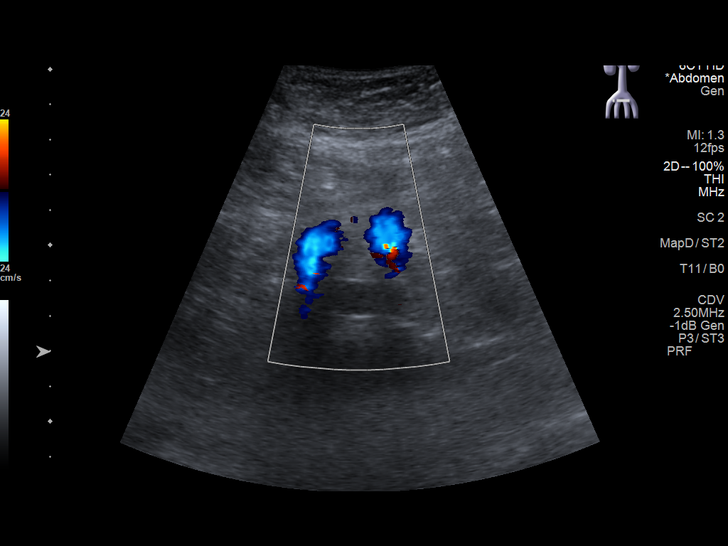

[14 of 25 positions shown; findings below may reference images not displayed]

FINDINGS: Gallbladder: No gallstones or wall thickening visualized. No
sonographic Murphy sign noted by sonographer.

Common bile duct: Diameter: 3.8 mm

Liver: Liver is echogenic. No focal hepatic abnormality. Portal vein
is patent on color Doppler imaging with normal direction of blood
flow towards the liver.

IVC: No abnormality visualized.

Pancreas: Visualized portion unremarkable.

Spleen: Size and appearance within normal limits.

Right Kidney: Length: 8.8 cm. Cortical echogenicity within normal
limits. No hydronephrosis. Cysts within the right kidney. The
largest is seen within the upper pole and measures 2.6 x 3.4 x
cm.

Left Kidney: Length: 8.4 cm. Cortical echogenicity is normal. No
hydronephrosis. Cyst in the upper pole measuring 4 x 1.7 x 2.3 cm.

Abdominal aorta: No aneurysm visualized.

Other findings: None.
IMPRESSION: 1. Echogenic liver consistent with steatosis and or hepatocellular
disease.
2. Bilateral renal cysts
3. Negative for gallstones

## 2022-02-21 ENCOUNTER — Ambulatory Visit (INDEPENDENT_AMBULATORY_CARE_PROVIDER_SITE_OTHER): Payer: Medicare Other | Admitting: Physician Assistant

## 2022-02-21 ENCOUNTER — Ambulatory Visit: Payer: Self-pay

## 2022-02-21 ENCOUNTER — Encounter: Payer: Self-pay | Admitting: Physician Assistant

## 2022-02-21 VITALS — BP 121/69 | HR 98 | Temp 97.8°F | Ht 63.0 in | Wt 121.0 lb

## 2022-02-21 DIAGNOSIS — J449 Chronic obstructive pulmonary disease, unspecified: Secondary | ICD-10-CM | POA: Diagnosis not present

## 2022-02-21 DIAGNOSIS — Z9181 History of falling: Secondary | ICD-10-CM

## 2022-02-21 DIAGNOSIS — R2681 Unsteadiness on feet: Secondary | ICD-10-CM | POA: Diagnosis not present

## 2022-02-21 DIAGNOSIS — R5383 Other fatigue: Secondary | ICD-10-CM | POA: Diagnosis not present

## 2022-02-21 DIAGNOSIS — I10 Essential (primary) hypertension: Secondary | ICD-10-CM

## 2022-02-21 DIAGNOSIS — R531 Weakness: Secondary | ICD-10-CM

## 2022-02-21 NOTE — Telephone Encounter (Signed)
  Chief Complaint: "doesn't feel good" Symptoms: burping a lot Frequency: 4 days Pertinent Negatives: Patient denies  Disposition: '[]'$ ED /'[]'$ Urgent Care (no appt availability in office) / '[x]'$ Appointment(In office/virtual)/ '[]'$  [facility]/ '[]'$ Home Care/ '[]'$ Refused Recommended Disposition /'[]'$ [place] [place] [facility]/ '[]'$  Follow-up with PCP Additional Notes: Spoke with pt's daughter. She states that pt is not feeling well. Pt reports no specific s/s. Son in law saw pt yesterday and reported that pt is burping a lot. Pt does have acid reflux.    Summary: Nurse call request   Pt's daughter called to report that the patient is not feeling well, this is day 4. The patient does not feel good, but does not know what is wrong. No symptoms to report  Best contact: [phone]       Reason for Disposition  Nursing judgment  Protocols used: No Guideline or Reference Available-[facility]

## 2022-02-21 NOTE — Assessment & Plan Note (Addendum)
Discussed progressing weakness is likely 2/2 deconditioning, and likely some depression. Pt denies depression feelings, does not want to start medication. Discussed starting physical therapy at home to help with strengthening and prevent future falls. Pt agrees. Discussed level of care, encouraged some additional professional help at home.  MSE today 25/30, a change from last visit 30/30 there does seem to be worse short-term memory issues, but pt is also having a more difficult time hearing today. They have appt to check hearing aids. Will check urine culture to r/o UTI.

## 2022-02-21 NOTE — Progress Notes (Addendum)
Established patient visit   Patient: Kathy Pacheco   DOB: [birth year]   89 y.o. Female  MRN: [id number] Visit Date: 02/21/2022  Today's healthcare provider: [name], PA-C   Chief Complaint  Patient presents with   Fatigue    Pt stated--feeling fatigue-4 days   Subjective    HPI  Pt presents today with her daughter Kathy Pacheco.   Kathy Pacheco is very concerned over her mother's increasing levels of fatigue, not wanting to do things. She is concerned she is depressed. Also worried about the level of her care and quality of life going forward.   Kathy Pacheco today reports feeling a little fatigued for the last few days, just doesn't feel like doing anything. Denies cough, fever, chills, urinary symptoms, shortness of breath.  Pt doesn't go to church anymore, doesn't leave her house much, lives alone.   She reports one fall last month on a curb, no injury.   Medications: Outpatient Medications Prior to Visit  Medication Sig   acetaminophen (TYLENOL) 500 MG tablet Take 500 mg by mouth every 6 (six) hours as needed for mild pain.   albuterol (VENTOLIN HFA) 108 (90 Base) MCG/ACT inhaler Inhale 2 puffs into the lungs every 6 (six) hours as needed for wheezing or shortness of breath.   amLODipine (NORVASC) 5 MG tablet Take 1 tablet (5 mg total) by mouth daily.   aspirin EC 81 MG tablet Take 81 mg by mouth daily. Swallow whole.   famotidine (PEPCID) 40 MG tablet Take 40 mg by mouth at bedtime.   ondansetron (ZOFRAN ODT) 4 MG disintegrating tablet Allow 1-2 tablets to dissolve in your mouth every 8 hours as needed for nausea/vomiting   pantoprazole (PROTONIX) 40 MG tablet 40 mg 2 (two) times daily. 30 minutes before meals   rosuvastatin (CRESTOR) 10 MG tablet TAKE 1 TABLET BY MOUTH EVERY DAY   triamcinolone cream (KENALOG) 0.1 % Apply twice daily to affected areas on body up to 2 weeks as needed for itching. Avoid applying to face, groin, and axilla.   No facility-administered medications prior  to visit.       Objective    BP 121/69 (BP Location: Left Arm, Patient Position: Sitting, Cuff Size: Normal)   Pulse 98   Temp 97.8 F (36.6 C)   Ht '5\' 3"'$  (1.6 m)   Wt 121 lb (54.9 kg)   SpO2 98%   BMI 21.43 kg/m    Physical Exam Constitutional:      General: She is awake.     Appearance: She is well-developed.  HENT:     Head: Normocephalic.  Eyes:     Conjunctiva/sclera: Conjunctivae normal.  Cardiovascular:     Rate and Rhythm: Normal rate and regular rhythm.     Heart sounds: Normal heart sounds.  Pulmonary:     Effort: Pulmonary effort is normal.     Breath sounds: Normal breath sounds.  Abdominal:     General: Abdomen is flat. There is no distension.     Palpations: Abdomen is soft.     Tenderness: There is no abdominal tenderness. There is no guarding.  Skin:    General: Skin is warm.  Neurological:     Mental Status: She is alert and oriented to person, place, and time.  Psychiatric:        Attention and Perception: Attention normal.        Mood and Affect: Mood normal.        Speech: Speech normal.  Behavior: Behavior is cooperative.      Results for orders placed or performed in visit on 02/21/22  Urine Culture   Specimen: Urine   Urine  Result Value Ref Range   Urine Culture, Routine Final report    Organism ID, Bacteria Comment     Assessment & Plan     Problem List Items Addressed This Visit       Cardiovascular and Mediastinum   Essential hypertension    Controlled with lifestyle modifications, will continue to monitor      Relevant Orders   Ambulatory referral to Home Health     Respiratory   COPD, mild ([place])    Asymptomatic today, only manages with albuterol inhaler prn. Will continue to monitor for exacerbation or changes      Relevant Orders   Ambulatory referral to [facility]     Other   Weakness - Primary    Discussed progressing weakness is likely 2/2 deconditioning, and likely some depression. Pt denies  depression feelings, does not want to start medication. Discussed starting physical therapy at home to help with strengthening and prevent future falls. Pt agrees. Discussed level of care, encouraged some additional professional help at home.  MSE today 25/30, a change from last visit 30/30 there does seem to be worse short-term memory issues, but pt is also having a more difficult time hearing today. They have appt to check hearing aids. Will check urine culture to r/o UTI.        Relevant Orders   Urine Culture (Completed)   Ambulatory referral to Home Health   At high risk for falls    Due to increasing overall weakness, lack of endurance, and unstable gait. Recommending physical therapy to increase strength, endurance, and gait train, may need assistive device      Relevant Orders   Ambulatory referral to [place]   Other Visit Diagnoses     Unstable gait       Relevant Orders   Ambulatory referral to [facility]       45 minutes+ was spent with patient and daughter discussing help at home, quality of life, emotional state.   Return in about 4 months (around 06/22/2022) for chronic conditions.      I, [name], PA-C have reviewed all documentation for this visit. The documentation on  02/21/22  for the exam, diagnosis, procedures, and orders are all accurate and complete.  [name], PA-C [facility] [address] [place], [place], [address] Office: [phone] Fax: [place]

## 2022-02-22 NOTE — Assessment & Plan Note (Addendum)
Controlled with lifestyle modifications, will continue to monitor

## 2022-02-22 NOTE — Assessment & Plan Note (Addendum)
Asymptomatic today, only manages with albuterol inhaler prn. Will continue to monitor for exacerbation or changes

## 2022-02-22 NOTE — Addendum Note (Signed)
Addended byMikey Kirschner on: 02/22/2022 11:38 AM   Modules accepted: Orders

## 2022-02-23 ENCOUNTER — Telehealth: Payer: Self-pay

## 2022-02-23 DIAGNOSIS — G629 Polyneuropathy, unspecified: Secondary | ICD-10-CM | POA: Diagnosis not present

## 2022-02-23 DIAGNOSIS — F419 Anxiety disorder, unspecified: Secondary | ICD-10-CM | POA: Diagnosis not present

## 2022-02-23 DIAGNOSIS — D649 Anemia, unspecified: Secondary | ICD-10-CM | POA: Diagnosis not present

## 2022-02-23 DIAGNOSIS — Z7951 Long term (current) use of inhaled steroids: Secondary | ICD-10-CM | POA: Diagnosis not present

## 2022-02-23 DIAGNOSIS — K5730 Diverticulosis of large intestine without perforation or abscess without bleeding: Secondary | ICD-10-CM | POA: Diagnosis not present

## 2022-02-23 DIAGNOSIS — Z9181 History of falling: Secondary | ICD-10-CM | POA: Insufficient documentation

## 2022-02-23 DIAGNOSIS — Z9842 Cataract extraction status, left eye: Secondary | ICD-10-CM | POA: Diagnosis not present

## 2022-02-23 DIAGNOSIS — K219 Gastro-esophageal reflux disease without esophagitis: Secondary | ICD-10-CM | POA: Diagnosis not present

## 2022-02-23 DIAGNOSIS — F32A Depression, unspecified: Secondary | ICD-10-CM | POA: Diagnosis not present

## 2022-02-23 DIAGNOSIS — J449 Chronic obstructive pulmonary disease, unspecified: Secondary | ICD-10-CM | POA: Diagnosis not present

## 2022-02-23 DIAGNOSIS — R2681 Unsteadiness on feet: Secondary | ICD-10-CM | POA: Diagnosis not present

## 2022-02-23 DIAGNOSIS — Z9841 Cataract extraction status, right eye: Secondary | ICD-10-CM | POA: Diagnosis not present

## 2022-02-23 DIAGNOSIS — M810 Age-related osteoporosis without current pathological fracture: Secondary | ICD-10-CM | POA: Diagnosis not present

## 2022-02-23 DIAGNOSIS — E7800 Pure hypercholesterolemia, unspecified: Secondary | ICD-10-CM | POA: Diagnosis not present

## 2022-02-23 DIAGNOSIS — R5383 Other fatigue: Secondary | ICD-10-CM | POA: Diagnosis not present

## 2022-02-23 DIAGNOSIS — R531 Weakness: Secondary | ICD-10-CM | POA: Diagnosis not present

## 2022-02-23 DIAGNOSIS — I10 Essential (primary) hypertension: Secondary | ICD-10-CM | POA: Diagnosis not present

## 2022-02-23 LAB — URINE CULTURE

## 2022-02-23 NOTE — Telephone Encounter (Signed)
Notified pt daughter w/ lab results and voiced understanding.

## 2022-02-23 NOTE — Addendum Note (Signed)
Addended byMikey Kirschner on: 02/23/2022 09:27 AM   Modules accepted: Orders

## 2022-02-23 NOTE — Assessment & Plan Note (Signed)
Due to increasing overall weakness, lack of endurance, and unstable gait. Recommending physical therapy to increase strength, endurance, and gait train, may need assistive device

## 2022-02-23 NOTE — Telephone Encounter (Signed)
Copied from [place] [phone]. Topic: General - Other >> Feb 23, 2022 10:12 AM Cyndi Bender wrote: Reason for CRM: Pt daughter [name] called for pt lab results from the urine sample. [name] requests call back to discuss results. Cb# [phone]

## 2022-02-27 ENCOUNTER — Ambulatory Visit (INDEPENDENT_AMBULATORY_CARE_PROVIDER_SITE_OTHER): Payer: Medicare Other

## 2022-02-27 VITALS — BP 122/70 | Ht 63.0 in | Wt 120.9 lb

## 2022-02-27 DIAGNOSIS — Z0000 Encounter for general adult medical examination without abnormal findings: Secondary | ICD-10-CM

## 2022-02-27 NOTE — Patient Instructions (Addendum)
Kathy Pacheco , Thank you for taking time to come for your Medicare Wellness Visit. I appreciate your ongoing commitment to your health goals. Please review the following plan we discussed and let me know if I can assist you in the future.   These are the goals we discussed:  Goals      DIET - EAT MORE FRUITS AND VEGETABLES     DIET - INCREASE WATER INTAKE     Recommend to drink at least 6-8 8oz glasses of water per day.     DIET - REDUCE SUGAR INTAKE     Find Help in My Community     Timeframe:  Long-Range Goal Priority:  Medium Start Date:   09/07/20                          Expected End Date: 10/14/20                    Follow Up Date 10/14/20    - follow-up on any referrals for help including in home care - think ahead to make sure my need does not become an emergency - have a back-up plan    Why is this important?   Knowing how and where to find help for yourself or family in your neighborhood and community is an important skill.  You will want to take some steps to learn how.    Notes:         This is a list of the screening recommended for you and due dates:  Health Maintenance  Topic Date Due   Zoster (Shingles) Vaccine (1 of 2) Never done   COVID-19 Vaccine (4 - 2023-24 season) 10/07/2021   DEXA scan (bone density measurement)  11/12/2022*   Medicare Annual Wellness Visit  02/28/2023   DTaP/Tdap/Td vaccine (3 - Td or Tdap) 09/21/2030   Pneumonia Vaccine  Completed   Flu Shot  Completed   HPV Vaccine  Aged Out  *Topic was postponed. The date shown is not the original due date.    Advanced directives: no  Conditions/risks identified: none  Next appointment: Follow up in one year for your annual wellness visit 03/05/23 @ 1pm in person   Preventive Care 65 Years and Older, Female Preventive care refers to lifestyle choices and visits with your health care provider that can promote health and wellness. What does preventive care include? A yearly physical exam.  This is also called an annual well check. Dental exams once or twice a year. Routine eye exams. Ask your health care provider how often you should have your eyes checked. Personal lifestyle choices, including: Daily care of your teeth and gums. Regular physical activity. Eating a healthy diet. Avoiding tobacco and drug use. Limiting alcohol use. Practicing safe sex. Taking low-dose aspirin every day. Taking vitamin and mineral supplements as recommended by your health care provider. What happens during an annual well check? The services and screenings done by your health care provider during your annual well check will depend on your age, overall health, lifestyle risk factors, and family history of disease. Counseling  Your health care provider may ask you questions about your: Alcohol use. Tobacco use. Drug use. Emotional well-being. Home and relationship well-being. Sexual activity. Eating habits. History of falls. Memory and ability to understand (cognition). Work and work Statistician. Reproductive health. Screening  You may have the following tests or measurements: Height, weight, and BMI. Blood pressure. Lipid and cholesterol  levels. These may be checked every 5 years, or more frequently if you are over 61 years old. Skin check. Lung cancer screening. You may have this screening every year starting at age 93 if you have a 30-pack-year history of smoking and currently smoke or have quit within the past 15 years. Fecal occult blood test (FOBT) of the stool. You may have this test every year starting at age 35. Flexible sigmoidoscopy or colonoscopy. You may have a sigmoidoscopy every 5 years or a colonoscopy every 10 years starting at age 7. Hepatitis C blood test. Hepatitis B blood test. Sexually transmitted disease (STD) testing. Diabetes screening. This is done by checking your blood sugar (glucose) after you have not eaten for a while (fasting). You may have this done  every 1-3 years. Bone density scan. This is done to screen for osteoporosis. You may have this done starting at age 50. Mammogram. This may be done every 1-2 years. Talk to your health care provider about how often you should have regular mammograms. Talk with your health care provider about your test results, treatment options, and if necessary, the need for more tests. Vaccines  Your health care provider may recommend certain vaccines, such as: Influenza vaccine. This is recommended every year. Tetanus, diphtheria, and acellular pertussis (Tdap, Td) vaccine. You may need a Td booster every 10 years. Zoster vaccine. You may need this after age 6. Pneumococcal 13-valent conjugate (PCV13) vaccine. One dose is recommended after age 57. Pneumococcal polysaccharide (PPSV23) vaccine. One dose is recommended after age 34. Talk to your health care provider about which screenings and vaccines you need and how often you need them. This information is not intended to replace advice given to you by your health care provider. Make sure you discuss any questions you have with your health care provider. Document Released: 02/19/2015 Document Revised: 10/13/2015 Document Reviewed: 11/24/2014 Elsevier Interactive Patient Education  2017 [place] Prevention in the Home Falls can cause injuries. They can happen to people of all ages. There are many things you can do to make your home safe and to help prevent falls. What can I do on the outside of my home? Regularly fix the edges of walkways and driveways and fix any cracks. Remove anything that might make you trip as you walk through a door, such as a raised step or threshold. Trim any bushes or trees on the path to your home. Use bright outdoor lighting. Clear any walking paths of anything that might make someone trip, such as rocks or tools. Regularly check to see if handrails are loose or broken. Make sure that both sides of any steps have  handrails. Any raised decks and porches should have guardrails on the edges. Have any leaves, snow, or ice cleared regularly. Use sand or salt on walking paths during winter. Clean up any spills in your garage right away. This includes oil or grease spills. What can I do in the bathroom? Use night lights. Install grab bars by the toilet and in the tub and shower. Do not use towel bars as grab bars. Use non-skid mats or decals in the tub or shower. If you need to sit down in the shower, use a plastic, non-slip stool. Keep the floor dry. Clean up any water that spills on the floor as soon as it happens. Remove soap buildup in the tub or shower regularly. Attach bath mats securely with double-sided non-slip rug tape. Do not have throw rugs and other things  on the floor that can make you trip. What can I do in the bedroom? Use night lights. Make sure that you have a light by your bed that is easy to reach. Do not use any sheets or blankets that are too big for your bed. They should not hang down onto the floor. Have a firm chair that has side arms. You can use this for support while you get dressed. Do not have throw rugs and other things on the floor that can make you trip. What can I do in the kitchen? Clean up any spills right away. Avoid walking on wet floors. Keep items that you use a lot in easy-to-reach places. If you need to reach something above you, use a strong step stool that has a grab bar. Keep electrical cords out of the way. Do not use floor polish or wax that makes floors slippery. If you must use wax, use non-skid floor wax. Do not have throw rugs and other things on the floor that can make you trip. What can I do with my stairs? Do not leave any items on the stairs. Make sure that there are handrails on both sides of the stairs and use them. Fix handrails that are broken or loose. Make sure that handrails are as long as the stairways. Check any carpeting to make sure  that it is firmly attached to the stairs. Fix any carpet that is loose or worn. Avoid having throw rugs at the top or bottom of the stairs. If you do have throw rugs, attach them to the floor with carpet tape. Make sure that you have a light switch at the top of the stairs and the bottom of the stairs. If you do not have them, ask someone to add them for you. What else can I do to help prevent falls? Wear shoes that: Do not have high heels. Have rubber bottoms. Are comfortable and fit you well. Are closed at the toe. Do not wear sandals. If you use a stepladder: Make sure that it is fully opened. Do not climb a closed stepladder. Make sure that both sides of the stepladder are locked into place. Ask someone to hold it for you, if possible. Clearly mark and make sure that you can see: Any grab bars or handrails. First and last steps. Where the edge of each step is. Use tools that help you move around (mobility aids) if they are needed. These include: Canes. Walkers. Scooters. Crutches. Turn on the lights when you go into a dark area. Replace any light bulbs as soon as they burn out. Set up your furniture so you have a clear path. Avoid moving your furniture around. If any of your floors are uneven, fix them. If there are any pets around you, be aware of where they are. Review your medicines with your doctor. Some medicines can make you feel dizzy. This can increase your chance of falling. Ask your doctor what other things that you can do to help prevent falls. This information is not intended to replace advice given to you by your health care provider. Make sure you discuss any questions you have with your health care provider. Document Released: 11/19/2008 Document Revised: 07/01/2015 Document Reviewed: 02/27/2014 Elsevier Interactive Patient Education  2017 [employer].

## 2022-02-27 NOTE — Progress Notes (Signed)
Subjective:   Kathy Pacheco is a 89 y.o. female who presents for Medicare Annual (Subsequent) preventive examination.  Review of Systems     Cardiac Risk Factors include: advanced age (>49mn, >>74women);dyslipidemia;hypertension     Objective:    Today's Vitals   02/27/22 1324  BP: 122/70  Weight: 120 lb 14.4 oz (54.8 kg)  Height: '5\' 3"'$  (1.6 m)   Body mass index is 21.42 kg/m.     02/27/2022    1:32 PM 06/18/2021   11:59 AM 02/23/2021    1:48 PM 09/10/2020    9:55 AM 07/21/2020    7:06 AM 06/02/2020    2:36 AM 06/01/2020    2:23 PM  Advanced Directives  Does Patient Have a Medical Advance Directive? No Yes No No No  No  Type of ACorporate treasurerof [place] will       Would patient like information on creating a medical advance directive? No - Patient declined No - Patient declined No - Patient declined  No - Patient declined No - Patient declined     Current Medications (verified) Outpatient Encounter Medications as of 02/27/2022  Medication Sig   acetaminophen (TYLENOL) 500 MG tablet Take 500 mg by mouth every 6 (six) hours as needed for mild pain.   albuterol (VENTOLIN HFA) 108 (90 Base) MCG/ACT inhaler Inhale 2 puffs into the lungs every 6 (six) hours as needed for wheezing or shortness of breath.   amLODipine (NORVASC) 5 MG tablet Take 1 tablet (5 mg total) by mouth daily.   famotidine (PEPCID) 40 MG tablet Take 40 mg by mouth at bedtime.   pantoprazole (PROTONIX) 40 MG tablet 40 mg 2 (two) times daily. 30 minutes before meals   rosuvastatin (CRESTOR) 10 MG tablet TAKE 1 TABLET BY MOUTH EVERY DAY   triamcinolone cream (KENALOG) 0.1 % Apply twice daily to affected areas on body up to 2 weeks as needed for itching. Avoid applying to face, groin, and axilla.   aspirin EC 81 MG tablet Take 81 mg by mouth daily. Swallow whole. (Patient not taking: Reported on 02/27/2022)   ondansetron (ZOFRAN ODT) 4 MG disintegrating tablet Allow 1-2 tablets to  dissolve in your mouth every 8 hours as needed for nausea/vomiting (Patient not taking: Reported on 02/27/2022)   No facility-administered encounter medications on file as of 02/27/2022.    Allergies (verified) Ciprofloxacin   History: Past Medical History:  Diagnosis Date   Anxiety    Depression    Diverticulosis    GERD (gastroesophageal reflux disease)    Hyperlipidemia    Hypertension    PONV (postoperative nausea and vomiting)    PVD (peripheral vascular disease) (HCC)    Sleep apnea    Past Surgical History:  Procedure Laterality Date   CATARACT EXTRACTION Bilateral    COLONOSCOPY     ESOPHAGOGASTRODUODENOSCOPY (EGD) WITH PROPOFOL N/A 09/10/2020   Procedure: ESOPHAGOGASTRODUODENOSCOPY (EGD) WITH PROPOFOL;  Surgeon: BRobert Bellow MD;  Location: [facility];  Service: Endoscopy;  Laterality: N/A;   ETHMOIDECTOMY Bilateral 03/10/2016   Procedure: ETHMOIDECTOMY;  Surgeon: CBeverly Gust MD;  [place]  Service: ENT;  Laterality: Bilateral;   FRONTAL SINUS EXPLORATION Bilateral 03/10/2016   Procedure: FRONTAL WITH TISSUE REMOVAL;  Surgeon: CBeverly Gust MD;  [place]  Service: ENT;  Laterality: Bilateral;   HERNIA REPAIR     IMAGE GUIDED SINUS SURGERY Bilateral 03/10/2016   Procedure: IMAGE GUIDED SINUS SURGERY;  Surgeon: CFreda Munro  Tami Ribas, MD;  Location: [place];  Service: ENT;  Laterality: Bilateral;  Need Disk GAVE DISK TO CECE 1-25 KP   MAXILLARY ANTROSTOMY Bilateral 03/10/2016   Procedure: MAXILLARY ANTROSTOMY;  Surgeon: Beverly Gust, MD;  Location: [place];  Service: ENT;  Laterality: Bilateral;   NASAL TURBINATE REDUCTION Bilateral 03/10/2016   Procedure: TURBINATE REDUCTION/SUBMUCOSAL RESECTION;  Surgeon: Beverly Gust, MD;  Location: [place];  Service: ENT;  Laterality: Bilateral;   SEPTOPLASTY Bilateral 03/10/2016   Procedure: SEPTOPLASTY;  Surgeon: Beverly Gust, MD;   Location: [place];  Service: ENT;  Laterality: Bilateral;   SPHENOIDECTOMY Bilateral 03/10/2016   Procedure: Coralee Pesa WITH TISSUE REMOVAL;  Surgeon: Beverly Gust, MD;  Location: [place];  Service: ENT;  Laterality: Bilateral;   Family History  Problem Relation Age of Onset   Epilepsy Mother    Osteoporosis Mother    Coronary artery disease Mother    COPD Father    Hypertension Father    CVA Father    Allergies Father    Lung cancer Father    Emphysema Father    Cancer Sister        breast   Breast cancer Sister 35   Osteoporosis Sister    COPD Sister    Coronary artery disease Brother    CVA Brother    COPD Brother    Heart disease Brother    Arthritis Brother    CVA Brother    Cancer Brother        cancer   Healthy Brother    Healthy Brother    Cancer Maternal Grandmother    Hypertension Maternal Grandmother    Social History   Socioeconomic History   Marital status: Widowed    Spouse name: Not on file   Number of children: 1   Years of education: Not on file   Highest education level: High school graduate  Occupational History   Occupation: retired  Tobacco Use   Smoking status: Never   Smokeless tobacco: Never  Vaping Use   Vaping Use: Never used  Substance and Sexual Activity   Alcohol use: No    Alcohol/week: 0.0 standard drinks of alcohol   Drug use: No   Sexual activity: Not on file  Other Topics Concern   Not on file  Social History Narrative   Not on file   Social Determinants of Health   Financial Resource Strain: Low Risk  (02/27/2022)   Overall Financial Resource Strain (CARDIA)    Difficulty of Paying Living Expenses: Not hard at all  Food Insecurity: No Food Insecurity (02/27/2022)   Hunger Vital Sign    Worried About Running Out of Food in the Last Year: Never true    [place] in the Last Year: Never true  Transportation Needs: No Transportation Needs (02/27/2022)   PRAPARE - Armed forces logistics/support/administrative officer (Medical): No    Lack of Transportation (Non-Medical): No  Physical Activity: Insufficiently Active (02/27/2022)   Exercise Vital Sign    Days of Exercise per Week: 2 days    Minutes of Exercise per Session: 20 min  Stress: No Stress Concern Present (02/27/2022)   [place]    Feeling of Stress : Not at all  Social Connections: Moderately Isolated (02/27/2022)   Social Connection and Isolation Panel [NHANES]    Frequency of Communication with Friends and Family: More than three times a week  Frequency of Social Gatherings with Friends and Family: More than three times a week    Attends Religious Services: More than 4 times per year    Active Member of Clubs or Organizations: No    Attends Archivist Meetings: Never    Marital Status: Widowed    Tobacco Counseling Counseling given: Not Answered   Clinical Intake:  Pre-visit preparation completed: Yes  Pain : No/denies pain     Nutritional Risks: None Diabetes: No  How often do you need to have someone help you when you read instructions, pamphlets, or other written materials from your doctor or pharmacy?: 1 - Never  Diabetic?no  Interpreter Needed?: No  Information entered by :: Kirke Shaggy, LPN   Activities of Daily Living    02/27/2022    1:35 PM 02/21/2022    2:56 PM  In your present state of health, do you have any difficulty performing the following activities:  Hearing? 1 1  Vision? 0 0  Difficulty concentrating or making decisions? 1 1  Walking or climbing stairs? 1 0  Dressing or bathing? 0 0  Doing errands, shopping? 1 1  Preparing Food and eating ? N   Using the Toilet? N   In the past six months, have you accidently leaked urine? N   Do you have problems with loss of bowel control? N   Managing your Medications? Y   Managing your Finances? Y   Housekeeping or managing your Housekeeping? Y      Patient Care Team: [name], Hershal Coria as PCP - General (Physician Assistant) Beverly Gust, MD (Otolaryngology) Estill Cotta, MD (Ophthalmology) Ubaldo Glassing Javier Docker, MD as Consulting Physician (Cardiology) Eugenie Birks Christian Mate., MD (Dentistry)  Indicate any recent Medical Services you may have received from other than [facility] providers in the past year (date may be approximate).     Assessment:   This is a routine wellness examination for Mikayah.  Hearing/Vision screen Hearing Screening - Comments:: Wears aids Vision Screening - Comments:: Readers- [facility]  Dietary issues and exercise activities discussed: Current Exercise Habits: Home exercise routine, Type of exercise: walking, Time (Minutes): 20, Frequency (Times/Week): 2, Weekly Exercise (Minutes/Week): 40   Goals Addressed             This Visit's Progress    DIET - REDUCE SUGAR INTAKE         Depression Screen    02/27/2022    1:28 PM 02/21/2022    2:55 PM 11/11/2021    2:05 PM 05/17/2021   11:43 AM 02/23/2021    1:46 PM 08/26/2020   10:56 AM 02/18/2020    1:54 PM  PHQ 2/9 Scores  PHQ - 2 Score 1 6 0 0 0 0 0  PHQ- 9 Score '3 13 1 1  '$ 0     Fall Risk    02/27/2022    1:33 PM 02/21/2022    4:54 PM 02/21/2022    2:57 PM 11/11/2021    3:35 PM 11/11/2021    2:05 PM  Fall Risk   Falls in the past year? '1 1 1 '$ 0 0  Number falls in past yr: 1 0 0 0 0  Comment   patient is a moderate fall risk    Injury with Fall? 0 0 0 0 0  Risk for fall due to : History of fall(s);Impaired balance/gait;Mental status change Impaired balance/gait;Impaired mobility;Mental status change Impaired balance/gait;Impaired mobility;Mental status change  No Fall Risks  Follow up Falls evaluation completed;Falls prevention  discussed  Falls evaluation completed;Education provided;Falls prevention discussed  Falls evaluation completed    FALL RISK PREVENTION PERTAINING TO THE HOME:  Any stairs in or around the home? Yes  If so, are  there any without handrails? No  Home free of loose throw rugs in walkways, pet beds, electrical cords, etc? Yes  Adequate lighting in your home to reduce risk of falls? Yes   ASSISTIVE DEVICES UTILIZED TO PREVENT FALLS:  Life alert? No  Use of a cane, walker or w/c? No  Grab bars in the bathroom? Yes  Shower chair or bench in shower? No  Elevated toilet seat or a handicapped toilet? Yes   TIMED UP AND GO:  Was the test performed? Yes .  Length of time to ambulate 10 feet: 6 sec.   Gait slow and steady without use of assistive device  Cognitive Function:    02/21/2022    4:48 PM 11/11/2021    2:17 PM 05/17/2021   12:04 PM 11/29/2020   10:49 AM 06/25/2019   10:30 AM  MMSE - Mini Mental State Exam  Orientation to time '4 5 3 4 5  '$ Orientation to Place '5 5 4 5 5  '$ Registration '3 3 3 3 3  '$ Attention/ Calculation '5 5 3 5 5  '$ Recall 0 3 2 0 3  Language- name 2 objects '2 2 2 2 2  '$ Language- repeat '1 1 1 1 1  '$ Language- follow 3 step command '3 3 3 3 3  '$ Language- read & follow direction '1 1 1 1 1  '$ Write a sentence '1 1 1 1 1  '$ Copy design 0 '1 1 1 1  '$ Total score '25 30 24 26 30        '$ 02/27/2022    1:41 PM 02/07/2018   10:10 AM  6CIT Screen  What Year? 0 points 0 points  What month? 0 points 0 points  What time? 0 points 0 points  Count back from 20 4 points 0 points  Months in reverse 4 points 0 points  Repeat phrase 10 points 0 points  Total Score 18 points 0 points    Immunizations Immunization History  Administered Date(s) Administered   Fluad Quad(high Dose 65+) 10/22/2019, 11/29/2020, 11/16/2021   Influenza Split 12/02/2005, 11/19/2010, 12/02/2011   Influenza, High Dose Seasonal PF 10/28/2013, 11/04/2014, 11/04/2015, 11/22/2016, 11/21/2017, 10/30/2018   Influenza,inj,Quad PF,6+ Mos 11/02/2012   PFIZER(Purple Top)SARS-COV-2 Vaccination 02/27/2019, 03/20/2019, 03/10/2020   Pneumococcal Conjugate-13 08/04/2013   Pneumococcal Polysaccharide-23 05/02/2009   Td 09/10/2007    Tdap 09/20/2020    TDAP status: Up to date  Flu Vaccine status: Up to date  Pneumococcal vaccine status: Up to date  Covid-19 vaccine status: Completed vaccines  Qualifies for Shingles Vaccine? Yes   Zostavax completed No   Shingrix Completed?: No.    Education has been provided regarding the importance of this vaccine. Patient has been advised to call insurance company to determine out of pocket expense if they have not yet received this vaccine. Advised may also receive vaccine at local pharmacy or Health Dept. Verbalized acceptance and understanding.  Screening Tests Health Maintenance  Topic Date Due   Zoster Vaccines- Shingrix (1 of 2) Never done   COVID-19 Vaccine (4 - 2023-24 season) 10/07/2021   DEXA SCAN  11/12/2022 (Originally 04/03/2020)   Medicare Annual Wellness (AWV)  02/28/2023   DTaP/Tdap/Td (3 - Td or Tdap) 09/21/2030   Pneumonia Vaccine 3+ Years old  Completed   INFLUENZA VACCINE  Completed  HPV VACCINES  Aged Out    Health Maintenance  Health Maintenance Due  Topic Date Due   Zoster Vaccines- Shingrix (1 of 2) Never done   COVID-19 Vaccine (4 - 2023-24 season) 10/07/2021    Colorectal cancer screening: No longer required.   Mammogram status: No longer required due to age.  Lung Cancer Screening: (Low Dose CT Chest recommended if Age 91-80 years, 30 pack-year currently smoking OR have quit w/in 15years.) does not qualify.   Additional Screening:  Hepatitis C Screening: does not qualify; Completed no  Vision Screening: Recommended annual ophthalmology exams for early detection of glaucoma and other disorders of the eye. Is the patient up to date with their annual eye exam?  Yes  Who is the provider or what is the name of the office in which the patient attends annual eye exams? [facility] If pt is not established with a provider, would they like to be referred to a provider to establish care? No .   Dental Screening: Recommended annual dental  exams for proper oral hygiene  Community Resource Referral / Chronic Care Management: CRR required this visit?  No   CCM required this visit?  No      Plan:     I have personally reviewed and noted the following in the patient's chart:   Medical and social history Use of alcohol, tobacco or illicit drugs  Current medications and supplements including opioid prescriptions. Patient is not currently taking opioid prescriptions. Functional ability and status Nutritional status Physical activity Advanced directives List of other physicians Hospitalizations, surgeries, and ER visits in previous 12 months Vitals Screenings to include cognitive, depression, and falls Referrals and appointments  In addition, I have reviewed and discussed with patient certain preventive protocols, quality metrics, and best practice recommendations. A written personalized care plan for preventive services as well as general preventive health recommendations were provided to patient.     Dionisio David, LPN   [birth year]   Nurse Notes: none

## 2022-02-28 DIAGNOSIS — J449 Chronic obstructive pulmonary disease, unspecified: Secondary | ICD-10-CM | POA: Diagnosis not present

## 2022-02-28 DIAGNOSIS — K219 Gastro-esophageal reflux disease without esophagitis: Secondary | ICD-10-CM | POA: Diagnosis not present

## 2022-02-28 DIAGNOSIS — R5383 Other fatigue: Secondary | ICD-10-CM | POA: Diagnosis not present

## 2022-02-28 DIAGNOSIS — R2681 Unsteadiness on feet: Secondary | ICD-10-CM | POA: Diagnosis not present

## 2022-02-28 DIAGNOSIS — R531 Weakness: Secondary | ICD-10-CM | POA: Diagnosis not present

## 2022-02-28 DIAGNOSIS — I10 Essential (primary) hypertension: Secondary | ICD-10-CM | POA: Diagnosis not present

## 2022-03-08 ENCOUNTER — Telehealth: Payer: Self-pay | Admitting: Physician Assistant

## 2022-03-08 DIAGNOSIS — J449 Chronic obstructive pulmonary disease, unspecified: Secondary | ICD-10-CM | POA: Diagnosis not present

## 2022-03-08 DIAGNOSIS — R531 Weakness: Secondary | ICD-10-CM | POA: Diagnosis not present

## 2022-03-08 DIAGNOSIS — R2681 Unsteadiness on feet: Secondary | ICD-10-CM | POA: Diagnosis not present

## 2022-03-08 DIAGNOSIS — R5383 Other fatigue: Secondary | ICD-10-CM | POA: Diagnosis not present

## 2022-03-08 DIAGNOSIS — K219 Gastro-esophageal reflux disease without esophagitis: Secondary | ICD-10-CM | POA: Diagnosis not present

## 2022-03-08 DIAGNOSIS — I10 Essential (primary) hypertension: Secondary | ICD-10-CM | POA: Diagnosis not present

## 2022-03-08 NOTE — Telephone Encounter (Signed)
Pt's daughter called to request having an in house social worker visit. She says the visits over the phone are not helping. She says she just lost a close family member and needs help.   Best contact: [phone]

## 2022-03-09 NOTE — Telephone Encounter (Signed)
Virtual visit scheduled for Tuesday at 3:40 pm

## 2022-03-09 NOTE — Telephone Encounter (Signed)
VM full unable to leave message

## 2022-03-14 ENCOUNTER — Encounter: Payer: Self-pay | Admitting: Physician Assistant

## 2022-03-14 ENCOUNTER — Telehealth (INDEPENDENT_AMBULATORY_CARE_PROVIDER_SITE_OTHER): Payer: Medicare Other | Admitting: Physician Assistant

## 2022-03-14 DIAGNOSIS — F320 Major depressive disorder, single episode, mild: Secondary | ICD-10-CM

## 2022-03-14 DIAGNOSIS — F339 Major depressive disorder, recurrent, unspecified: Secondary | ICD-10-CM | POA: Insufficient documentation

## 2022-03-14 DIAGNOSIS — J449 Chronic obstructive pulmonary disease, unspecified: Secondary | ICD-10-CM

## 2022-03-14 DIAGNOSIS — G3184 Mild cognitive impairment, so stated: Secondary | ICD-10-CM | POA: Diagnosis not present

## 2022-03-14 DIAGNOSIS — I10 Essential (primary) hypertension: Secondary | ICD-10-CM | POA: Diagnosis not present

## 2022-03-14 NOTE — Progress Notes (Signed)
I,[name],acting as a Education administrator for [employer], PA-C.,have documented all relevant documentation on the behalf of Kathy Kirschner, PA-C,as directed by  Kathy Kirschner, PA-C while in the presence of Kathy Kirschner, PA-C.  MyChart Video Visit    Virtual Visit via Video Note   This format is felt to be most appropriate for this patient at this time. Physical exam was limited by quality of the video and audio technology used for the visit.   Patient location: home Provider location: [facility]  I discussed the limitations of evaluation and management by telemedicine and the availability of in person appointments. The patient expressed understanding and agreed to proceed.  Patient: Kathy Pacheco   DOB: [birth year]   89 y.o. Female  MRN: [id number] Visit Date: 03/14/2022  Today's healthcare provider: [name], PA-C   Cc. Grief, lack of motivation  Subjective    HPI  Pt is present with her daughter and son in law. They have concerns over depression, pt is unmotivated, does not want to change her clothes, eat meals, take her medications.   Pt reports she recently lost her sister in law and is grieving. She denies having issues taking her of herself, taking medications, taking care of the house. Pt does not want to start medication.  Medications: Outpatient Medications Prior to Visit  Medication Sig   acetaminophen (TYLENOL) 500 MG tablet Take 500 mg by mouth every 6 (six) hours as needed for mild pain.   albuterol (VENTOLIN HFA) 108 (90 Base) MCG/ACT inhaler Inhale 2 puffs into the lungs every 6 (six) hours as needed for wheezing or shortness of breath.   amLODipine (NORVASC) 5 MG tablet Take 1 tablet (5 mg total) by mouth daily.   aspirin EC 81 MG tablet Take 81 mg by mouth daily. Swallow whole. (Patient not taking: Reported on 02/27/2022)   famotidine (PEPCID) 40 MG tablet Take 40 mg by mouth at bedtime.   ondansetron (ZOFRAN ODT) 4 MG disintegrating  tablet Allow 1-2 tablets to dissolve in your mouth every 8 hours as needed for nausea/vomiting (Patient not taking: Reported on 02/27/2022)   pantoprazole (PROTONIX) 40 MG tablet 40 mg 2 (two) times daily. 30 minutes before meals   rosuvastatin (CRESTOR) 10 MG tablet TAKE 1 TABLET BY MOUTH EVERY DAY   triamcinolone cream (KENALOG) 0.1 % Apply twice daily to affected areas on body up to 2 weeks as needed for itching. Avoid applying to face, groin, and axilla.   No facility-administered medications prior to visit.     Objective    There were no vitals taken for this visit.    Physical Exam Constitutional:      Appearance: She is not ill-appearing or toxic-appearing.  Neurological:     Mental Status: She is oriented to person, place, and time.  Psychiatric:        Mood and Affect: Mood normal.        Behavior: Behavior normal.        Assessment & Plan     Problem List Items Addressed This Visit       Cardiovascular and Mediastinum   Essential hypertension   Relevant Orders   AMB Referral to Community Care Coordinaton ([facility] Patients)     Respiratory   COPD, mild ([place])   Relevant Orders   AMB Referral to Community Care Coordinaton ([facility] Patients)     Other   MCI (mild cognitive impairment)   Relevant Orders   AMB Referral to [facility]  Coordinaton ([facility] Patients)   Depression, major, single episode, mild ([place]) - Primary    More than likely depressed Difficult to tell if co-current memory decline Last mse in office was 30/30 Pt declines medication , SSRI Discussed ref to social work, discussing options going forward-- pt is open to this       Relevant Orders   AMB Referral to [place] ([facility] Patients)     Return if symptoms worsen or fail to improve.     I discussed the assessment and treatment plan with the patient. The patient was provided an opportunity to ask questions and all were answered. The patient agreed with the plan and demonstrated  an understanding of the instructions.   The patient was advised to call back or seek an in-person evaluation if the symptoms worsen or if the condition fails to improve as anticipated.  I provided 15 minutes of non-face-to-face time during this encounter.  I, Kathy Kirschner, PA-C have reviewed all documentation for this visit. The documentation on  03/14/22  for the exam, diagnosis, procedures, and orders are all accurate and complete.  Kathy Kirschner, PA-C [facility] [address] [place], [place], [address] Office: [phone] Fax: [place]

## 2022-03-14 NOTE — Assessment & Plan Note (Signed)
More than likely depressed Difficult to tell if co-current memory decline Last mse in office was 30/30 Pt declines medication , SSRI Discussed ref to social work, discussing options going forward-- pt is open to this

## 2022-03-15 DIAGNOSIS — R5383 Other fatigue: Secondary | ICD-10-CM | POA: Diagnosis not present

## 2022-03-15 DIAGNOSIS — K219 Gastro-esophageal reflux disease without esophagitis: Secondary | ICD-10-CM | POA: Diagnosis not present

## 2022-03-15 DIAGNOSIS — R531 Weakness: Secondary | ICD-10-CM | POA: Diagnosis not present

## 2022-03-15 DIAGNOSIS — J449 Chronic obstructive pulmonary disease, unspecified: Secondary | ICD-10-CM | POA: Diagnosis not present

## 2022-03-15 DIAGNOSIS — I10 Essential (primary) hypertension: Secondary | ICD-10-CM | POA: Diagnosis not present

## 2022-03-15 DIAGNOSIS — R2681 Unsteadiness on feet: Secondary | ICD-10-CM | POA: Diagnosis not present

## 2022-03-17 ENCOUNTER — Telehealth: Payer: Self-pay | Admitting: *Deleted

## 2022-03-17 NOTE — Progress Notes (Signed)
  Care Coordination   Note   05/09/3534 Name: SANTIAGO GRAF MRN: [id number] DOB: [birth year]  LEXIS POTENZA is a 89 y.o. year old female who sees Thedore Mins, Ria Comment, [place] for primary care. I reached out to [name] by phone today to offer care coordination services.  Ms. Althaus was given information about Care Coordination services today including:   The Care Coordination services include support from the care team which includes your Nurse Coordinator, Clinical Social Worker, or Pharmacist.  The Care Coordination team is here to help remove barriers to the health concerns and goals most important to you. Care Coordination services are voluntary, and the patient may decline or stop services at any time by request to their care team member.   Care Coordination Consent Status: Patient agreed to services and verbal consent obtained.   Follow up plan:  Telephone appointment with care coordination team member scheduled for:  03/20/2022  Encounter Outcome:  Pt. Scheduled from referral   Julian Hy, [place] Direct Dial: [phone]

## 2022-03-20 ENCOUNTER — Encounter: Admitting: *Deleted

## 2022-03-21 ENCOUNTER — Ambulatory Visit: Payer: Self-pay | Admitting: Licensed Clinical Social Worker

## 2022-03-21 NOTE — Patient Instructions (Signed)
Visit Information  Thank you for taking time to visit with me today. Please don't hesitate to contact me if I can be of assistance to you before our next scheduled telephone appointment.  Following are the goals we discussed today:    Our next appointment is by telephone on 04/26/22 at 10:30 AM   Please call the care guide team at [phone] if you need to cancel or reschedule your appointment.   If you are experiencing a Mental Health or [place] or need someone to talk to, please go to [facility] [place] [phone])   Following is a copy of your full plan of care:   Interventions: LCSW spoke today via phone with client regarding client needs. Client said she is grieving over recent death of her sister in law. .Sister in law lived near client and they were very close.  LCSW talked with client about local Hospice support in grief management Discussed appetite of client; discussed sleeping issues of client LCSW also talked via phone today with patient daughter, Darnelle Catalan.  LCSW discussed with [name] that client may benefit from talking with [place] eligibility specialist or [place] social worker to see if client could access VA support for her current needs. Discussed pain of client. Discussed ambulation of client. Physical therapist  has been conducting home visits for clientfor physical therapy support. This has been helpful to client.  Discussed with Darnelle Catalan, daughter of client, that client may be socially isolating herself. Client has stopped going to church and is not leaving her home very often Reviewed hearing needs of client. Client wears a hearing aid in each ear Reviewed SW, nursing and pharmacy support of program. Provided counseling support for client Client agreed for LCSW to call her and [name], daughter, on 04/26/22 at 10:30 AM  Ms. Almanza was given information about Care Management services by the  embedded care coordination team including:  Care Management services include personalized support from designated clinical staff supervised by her physician, including individualized plan of care and coordination with other care providers 24/7 contact phone numbers for assistance for urgent and routine care needs. The patient may stop CCM services at any time (effective at the end of the month) by phone call to the office staff.  Patient agreed to services and verbal consent obtained.   Norva Riffle.Mayes Sangiovanni MSW, [place] Holiday representative [facility] Care Management [phone]

## 2022-03-21 NOTE — Patient Outreach (Signed)
  Care Coordination   Initial Visit Note   09/16/9145 Name: MILLY GOGGINS MRN: [id number] DOB: [birth year]  Kathy Pacheco is a 89 y.o. year old female who sees Thedore Mins, Ria Comment, [place] for primary care. I spoke with  [name] by phone today.  What matters to the patients health and wellness today?  Patient is grieving over death of her sister in law   Goals Addressed               This Visit's Progress     Patient Stated she is grieving over death of her sister in law (pt-stated)        Interventions: LCSW spoke today via phone with client regarding client needs. Client said she is grieving over recent death of her sister in law. .Sister in law lived near client and they were very close.  LCSW talked with client about local Hospice support in grief management Discussed appetite of client; discussed sleeping issues of client LCSW also talked via phone today with patient daughter, Darnelle Catalan.  LCSW discussed with [name] that client may benefit from talking with [place] eligibility specialist or [place] social worker to see if client could access VA support for her current needs. Discussed pain of client. Discussed ambulation of client. Physical therapist  has been conducting home visits for clientfor physical therapy support. This has been helpful to client.  Discussed with Darnelle Catalan, daughter of client, that client may be socially isolating herself. Client has stopped going to church and is not leaving her home very often Reviewed hearing needs of client. Client wears a hearing aid in each ear Reviewed SW, nursing and pharmacy support of program. Provided counseling support for client Client agreed for LCSW to call her and [name], daughter, on 04/26/22 at 10:30 AM      SDOH assessments and interventions completed:  Yes  SDOH Interventions Today    Flowsheet Row Most Recent Value  SDOH Interventions   Depression Interventions/Treatment  Counseling  [grieving over loss of her sister  in law]  Physical Activity Interventions Other (Comments)  [client was having walking challenges,  she has now started to receive some in home physical therapy and feels that this is helpful to her]  Stress Interventions Provide Counseling  [client has stress related to grief issues faced. She has been not always taking medications on schedule, not always bathing regularly, not changing clothes regularly]        Care Coordination Interventions:  Yes, provided   Interventions Today    Flowsheet Row Most Recent Value  Chronic Disease   Chronic disease during today's visit Chronic Obstructive Pulmonary Disease (COPD), Other  [depression]  General Interventions   General Interventions Discussed/Reviewed General Interventions Discussed, [employer] program support. discussed hospice support]  Exercise Interventions   Exercise Discussed/Reviewed Physical Activity  [client receives physical therapy sessions in home as scheduled]  Education Interventions   Education Provided Provided Education  Provided Verbal Education On [employer]  [discussed Hospice resources related to managing grief issues]  Mental Health Interventions   Mental Health Discussed/Reviewed Depression  [discussion of depression management]  Safety Interventions   Safety Discussed/Reviewed Fall Risk  [client has Life Alert to use]         Follow up plan: Follow up call scheduled for 04/26/22  at 10:30 AM  Encounter Outcome:  Pt. Visit Completed   Norva Riffle.Daivik Overley MSW, LCSW Licensed Clinical Social Worker [facility] Care Management [phone]

## 2022-03-23 DIAGNOSIS — R5383 Other fatigue: Secondary | ICD-10-CM | POA: Diagnosis not present

## 2022-03-23 DIAGNOSIS — I10 Essential (primary) hypertension: Secondary | ICD-10-CM | POA: Diagnosis not present

## 2022-03-23 DIAGNOSIS — R2681 Unsteadiness on feet: Secondary | ICD-10-CM | POA: Diagnosis not present

## 2022-03-23 DIAGNOSIS — J449 Chronic obstructive pulmonary disease, unspecified: Secondary | ICD-10-CM | POA: Diagnosis not present

## 2022-03-23 DIAGNOSIS — K219 Gastro-esophageal reflux disease without esophagitis: Secondary | ICD-10-CM | POA: Diagnosis not present

## 2022-03-23 DIAGNOSIS — R531 Weakness: Secondary | ICD-10-CM | POA: Diagnosis not present

## 2022-03-25 DIAGNOSIS — Z9181 History of falling: Secondary | ICD-10-CM | POA: Diagnosis not present

## 2022-03-25 DIAGNOSIS — R2681 Unsteadiness on feet: Secondary | ICD-10-CM | POA: Diagnosis not present

## 2022-03-25 DIAGNOSIS — K219 Gastro-esophageal reflux disease without esophagitis: Secondary | ICD-10-CM | POA: Diagnosis not present

## 2022-03-25 DIAGNOSIS — Z9841 Cataract extraction status, right eye: Secondary | ICD-10-CM | POA: Diagnosis not present

## 2022-03-25 DIAGNOSIS — J449 Chronic obstructive pulmonary disease, unspecified: Secondary | ICD-10-CM | POA: Diagnosis not present

## 2022-03-25 DIAGNOSIS — R531 Weakness: Secondary | ICD-10-CM | POA: Diagnosis not present

## 2022-03-25 DIAGNOSIS — D649 Anemia, unspecified: Secondary | ICD-10-CM | POA: Diagnosis not present

## 2022-03-25 DIAGNOSIS — Z7951 Long term (current) use of inhaled steroids: Secondary | ICD-10-CM | POA: Diagnosis not present

## 2022-03-25 DIAGNOSIS — E7800 Pure hypercholesterolemia, unspecified: Secondary | ICD-10-CM | POA: Diagnosis not present

## 2022-03-25 DIAGNOSIS — Z9842 Cataract extraction status, left eye: Secondary | ICD-10-CM | POA: Diagnosis not present

## 2022-03-25 DIAGNOSIS — I10 Essential (primary) hypertension: Secondary | ICD-10-CM | POA: Diagnosis not present

## 2022-03-25 DIAGNOSIS — F32A Depression, unspecified: Secondary | ICD-10-CM | POA: Diagnosis not present

## 2022-03-25 DIAGNOSIS — M810 Age-related osteoporosis without current pathological fracture: Secondary | ICD-10-CM | POA: Diagnosis not present

## 2022-03-25 DIAGNOSIS — F419 Anxiety disorder, unspecified: Secondary | ICD-10-CM | POA: Diagnosis not present

## 2022-03-25 DIAGNOSIS — G629 Polyneuropathy, unspecified: Secondary | ICD-10-CM | POA: Diagnosis not present

## 2022-03-25 DIAGNOSIS — R5383 Other fatigue: Secondary | ICD-10-CM | POA: Diagnosis not present

## 2022-03-25 DIAGNOSIS — K5730 Diverticulosis of large intestine without perforation or abscess without bleeding: Secondary | ICD-10-CM | POA: Diagnosis not present

## 2022-03-28 DIAGNOSIS — J449 Chronic obstructive pulmonary disease, unspecified: Secondary | ICD-10-CM | POA: Diagnosis not present

## 2022-03-28 DIAGNOSIS — R531 Weakness: Secondary | ICD-10-CM | POA: Diagnosis not present

## 2022-03-28 DIAGNOSIS — R5383 Other fatigue: Secondary | ICD-10-CM | POA: Diagnosis not present

## 2022-03-28 DIAGNOSIS — I10 Essential (primary) hypertension: Secondary | ICD-10-CM | POA: Diagnosis not present

## 2022-03-28 DIAGNOSIS — K219 Gastro-esophageal reflux disease without esophagitis: Secondary | ICD-10-CM | POA: Diagnosis not present

## 2022-03-28 DIAGNOSIS — R2681 Unsteadiness on feet: Secondary | ICD-10-CM | POA: Diagnosis not present

## 2022-04-04 ENCOUNTER — Ambulatory Visit: Payer: Self-pay

## 2022-04-04 NOTE — Telephone Encounter (Signed)
  Chief Complaint: Depression Symptoms: Not wanting to do much of anything.  Frequency: Ongoing - worse since loss of friend and next door neighbor Pertinent Negatives: Patient denies self harm Disposition: []$ ED /[]$ Urgent Care (no appt availability in office) / [x]$ Appointment(In office/virtual)/ []$  [facility]/ []$ Home Care/ []$ Refused Recommended Disposition /[]$ [facility] [facility]/ []$  Follow-up with PCP Additional Notes: Call from pt's daughter Darnelle Catalan. [name] is concerned about the pt. [name] states that pt recently lost her friend and next door neighbor. Since then pt does not want to go out or perform self care. Per [name], pt states that she just doesn't feel like doing much of anything, however is engaged when she does go out and sees people. Pt also states that all of her friends have passed on. Pt has a Education officer, museum and gets meals on wheels.  [name] does not feel that pt needs to go to any type of assisted living as she is a fully competent person. Nyra Capes number for [facility], also appt for tomorrrow.     Reason for Disposition  [1] Depression AND [2] worsening (e.g., sleeping poorly, less able to do activities of daily living)  Answer Assessment - Initial Assessment Questions 1. CONCERN: "What happened that made you call today?"     Pt went a month without getting her hair washed. Doesn't want to change her clothes, or do much of anything. 2. DEPRESSION SYMPTOM SCREENING: "How are you feeling overall?" (e.g., decreased energy, increased sleeping or difficulty sleeping, difficulty concentrating, feelings of sadness, guilt, hopelessness, or worthlessness)      3. RISK OF HARM - SUICIDAL IDEATION:  "Do you ever have thoughts of hurting or killing yourself?"  (e.g., yes, no, no but preoccupation with thoughts about death)   - INTENT:  "Do you have thoughts of hurting or killing yourself right NOW?" (e.g., yes, no, N/A)   - PLAN: "Do you have a specific plan for how you would do  this?" (e.g., gun, knife, overdose, no plan, N/A)     no 4. RISK OF HARM - HOMICIDAL IDEATION:  "Do you ever have thoughts of hurting or killing someone else?"  (e.g., yes, no, no but preoccupation with thoughts about death)   - INTENT:  "Do you have thoughts of hurting or killing someone right NOW?" (e.g., yes, no, N/A)   - PLAN: "Do you have a specific plan for how you would do this?" (e.g., gun, knife, no plan, N/A)      no 5. FUNCTIONAL IMPAIRMENT: "How have things been going for you overall? Have you had more difficulty than usual doing your normal daily activities?"  (e.g., better, same, worse; self-care, school, work, interactions)      6. SUPPORT: "Who is with you now?" "Who do you live with?" "Do you have family or friends who you can talk to?"      NO friends - all have died. Friend dies last month. 7. THERAPIST: "Do you have a counselor or therapist? Name?"     no 8. STRESSORS: "Has there been any new stress or recent changes in your life?"     Loss of best friend 9. ALCOHOL USE OR SUBSTANCE USE (DRUG USE): "Do you drink alcohol or use any illegal drugs?"     no 10. OTHER: "Do you have any other physical symptoms right now?" (e.g., fever)       Just does not want to do much of anything  Protocols used: Depression-A-AH

## 2022-04-05 ENCOUNTER — Encounter: Payer: Self-pay | Admitting: Physician Assistant

## 2022-04-05 ENCOUNTER — Ambulatory Visit (INDEPENDENT_AMBULATORY_CARE_PROVIDER_SITE_OTHER): Payer: Medicare Other | Admitting: Physician Assistant

## 2022-04-05 VITALS — BP 107/68 | HR 91 | Temp 97.6°F | Wt 119.9 lb

## 2022-04-05 DIAGNOSIS — F320 Major depressive disorder, single episode, mild: Secondary | ICD-10-CM | POA: Diagnosis not present

## 2022-04-05 MED ORDER — SERTRALINE HCL 25 MG PO TABS: 25.0000 mg | ORAL_TABLET | Freq: Every day | ORAL | 1 refills | Status: DC

## 2022-04-05 NOTE — Progress Notes (Signed)
I,Connie R Striblin,acting as a Education administrator for [employer], PA-C.,have documented all relevant documentation on the behalf of Kathy Kirschner, PA-C,as directed by  Kathy Kirschner, PA-C while in the presence of Kathy Kirschner, PA-C.  Established patient visit   Patient: Kathy Pacheco   DOB: [birth year]   89 y.o. Female  MRN: [id number] Visit Date: 04/05/2022  Today's healthcare provider: [name], PA-C   Cc. Depression concerns  Subjective    HPI   Pt's daughter is again concerned regarding depression in her mother. Her mom does not take care of herself from her perspective-- showering, leaving the house, going to church. Her mother's sister in law recently passed which has made things worse. Reports her mom doesn't shower regularly and barely washes her hair.  When Tekelia is asked regarding these concerns, she reports feeling grief, but overall doing well. Reports little interest in what she used to enjoy, mostly as she is now alone and does not enjoy doing her usual activities alone.  She reports cleaning herself regularly, but not in the shower. She will take sponge baths instead. Reports overall low energy and lack of interest.    Medications: Outpatient Medications Prior to Visit  Medication Sig   acetaminophen (TYLENOL) 500 MG tablet Take 500 mg by mouth every 6 (six) hours as needed for mild pain.   albuterol (VENTOLIN HFA) 108 (90 Base) MCG/ACT inhaler Inhale 2 puffs into the lungs every 6 (six) hours as needed for wheezing or shortness of breath.   amLODipine (NORVASC) 5 MG tablet Take 1 tablet (5 mg total) by mouth daily.   aspirin EC 81 MG tablet Take 81 mg by mouth daily. Swallow whole.   famotidine (PEPCID) 40 MG tablet Take 40 mg by mouth at bedtime.   ondansetron (ZOFRAN ODT) 4 MG disintegrating tablet Allow 1-2 tablets to dissolve in your mouth every 8 hours as needed for nausea/vomiting   pantoprazole (PROTONIX) 40 MG tablet 40 mg 2 (two) times daily. 30  minutes before meals   rosuvastatin (CRESTOR) 10 MG tablet TAKE 1 TABLET BY MOUTH EVERY DAY   triamcinolone cream (KENALOG) 0.1 % Apply twice daily to affected areas on body up to 2 weeks as needed for itching. Avoid applying to face, groin, and axilla.   No facility-administered medications prior to visit.    Review of Systems  Constitutional:  Negative for fatigue and fever.  Respiratory:  Negative for cough and shortness of breath.   Cardiovascular:  Negative for chest pain and leg swelling.  Gastrointestinal:  Negative for abdominal pain.  Neurological:  Negative for dizziness and headaches.      Objective    BP 107/68 (BP Location: Right Arm, Patient Position: Sitting, Cuff Size: Normal)   Pulse 91   Temp 97.6 F (36.4 C) (Oral)   Wt 119 lb 14.4 oz (54.4 kg)   SpO2 100%   BMI 21.24 kg/m    Physical Exam Vitals reviewed.  Constitutional:      Appearance: She is not ill-appearing.  HENT:     Head: Normocephalic.  Eyes:     Conjunctiva/sclera: Conjunctivae normal.  Cardiovascular:     Rate and Rhythm: Normal rate.  Pulmonary:     Effort: Pulmonary effort is normal. No respiratory distress.  Neurological:     General: No focal deficit present.     Mental Status: She is alert and oriented to person, place, and time.  Psychiatric:        Mood and  Affect: Mood normal.        Behavior: Behavior normal.      No results found for any visits on 04/05/22.  Assessment & Plan     Problem List Items Addressed This Visit       Other   Depression, major, single episode, mild ([place]) - Primary    Over the last few visits getting to know Kathy Pacheco and her daughter Kathy Pacheco better-- I do agree that Byanca does have features of depression. I do think she would benefit from an SSRI. I had a open discussion with the family, acting as a mediator as both Kathy Pacheco and Kathy Pacheco are frustrated but ultimately concerned for one another.  Previously she has declined medication and therapy Again  discussed benefits of medication,etc. Pt willing to try medication for a short while. Rx zoloft 25 mg daily. F/u 1 mo .       Relevant Medications   sertraline (ZOLOFT) 25 MG tablet     Return in about 1 month (around 05/04/2022) for depression.      I, Kathy Kirschner, PA-C have reviewed all documentation for this visit. The documentation on  04/05/22 for the exam, diagnosis, procedures, and orders are all accurate and complete.  Kathy Kirschner, PA-C [facility] [address] [place], [place], [address] Office: [phone] Fax: [place]

## 2022-04-05 NOTE — Assessment & Plan Note (Signed)
Over the last few visits getting to know Doreen and her daughter [name] better-- I do agree that Aide does have features of depression. I do think she would benefit from an SSRI. I had a open discussion with the family, acting as a mediator as both Zykia and [name] are frustrated but ultimately concerned for one another.  Previously she has declined medication and therapy Again discussed benefits of medication,etc. Pt willing to try medication for a short while. Rx zoloft 25 mg daily. F/u 1 mo .

## 2022-04-06 DIAGNOSIS — J449 Chronic obstructive pulmonary disease, unspecified: Secondary | ICD-10-CM | POA: Diagnosis not present

## 2022-04-06 DIAGNOSIS — R2681 Unsteadiness on feet: Secondary | ICD-10-CM | POA: Diagnosis not present

## 2022-04-06 DIAGNOSIS — R531 Weakness: Secondary | ICD-10-CM | POA: Diagnosis not present

## 2022-04-06 DIAGNOSIS — R5383 Other fatigue: Secondary | ICD-10-CM | POA: Diagnosis not present

## 2022-04-06 DIAGNOSIS — K219 Gastro-esophageal reflux disease without esophagitis: Secondary | ICD-10-CM | POA: Diagnosis not present

## 2022-04-06 DIAGNOSIS — I10 Essential (primary) hypertension: Secondary | ICD-10-CM | POA: Diagnosis not present

## 2022-04-07 ENCOUNTER — Encounter: Payer: Self-pay | Admitting: Physician Assistant

## 2022-04-11 DIAGNOSIS — J449 Chronic obstructive pulmonary disease, unspecified: Secondary | ICD-10-CM | POA: Diagnosis not present

## 2022-04-11 DIAGNOSIS — I10 Essential (primary) hypertension: Secondary | ICD-10-CM | POA: Diagnosis not present

## 2022-04-11 DIAGNOSIS — K219 Gastro-esophageal reflux disease without esophagitis: Secondary | ICD-10-CM | POA: Diagnosis not present

## 2022-04-11 DIAGNOSIS — R5383 Other fatigue: Secondary | ICD-10-CM | POA: Diagnosis not present

## 2022-04-11 DIAGNOSIS — R2681 Unsteadiness on feet: Secondary | ICD-10-CM | POA: Diagnosis not present

## 2022-04-11 DIAGNOSIS — R531 Weakness: Secondary | ICD-10-CM | POA: Diagnosis not present

## 2022-04-17 ENCOUNTER — Telehealth: Payer: Self-pay

## 2022-04-17 NOTE — Telephone Encounter (Signed)
Copied from [place] [id number]. Topic: General - Other >> Apr 17, 2022  9:27 AM Everette C wrote: Reason for CRM: The patient's daughter has called to follow up a previous order for social work submitted by [employer]   The patient's daughter would like an update when possible and to know the status of the order  Please contact further when possible

## 2022-04-18 NOTE — Telephone Encounter (Signed)
Please refer to patient message on [id number] Last read by [name] at  123456 PM on 04/17/2022.

## 2022-04-19 ENCOUNTER — Telehealth: Payer: Self-pay | Admitting: Physician Assistant

## 2022-04-19 DIAGNOSIS — J449 Chronic obstructive pulmonary disease, unspecified: Secondary | ICD-10-CM | POA: Diagnosis not present

## 2022-04-19 DIAGNOSIS — R2681 Unsteadiness on feet: Secondary | ICD-10-CM | POA: Diagnosis not present

## 2022-04-19 DIAGNOSIS — R5383 Other fatigue: Secondary | ICD-10-CM | POA: Diagnosis not present

## 2022-04-19 DIAGNOSIS — K219 Gastro-esophageal reflux disease without esophagitis: Secondary | ICD-10-CM | POA: Diagnosis not present

## 2022-04-19 DIAGNOSIS — R531 Weakness: Secondary | ICD-10-CM | POA: Diagnosis not present

## 2022-04-19 DIAGNOSIS — I10 Essential (primary) hypertension: Secondary | ICD-10-CM | POA: Diagnosis not present

## 2022-04-19 NOTE — Telephone Encounter (Signed)
Copied from [place] [id number]. Topic: General - Other >> Apr 18, 2022  3:35 PM Leone Payor F wrote: Reason for CRM: Pt's daughter Darnelle Catalan is calling because she says [facility] faxed over a form requesting a social worker to come and see the pt. [name] says she will have the fax sent over again and would like a follow up once the form has been received. [name] can be reached [phone]

## 2022-04-21 DIAGNOSIS — R531 Weakness: Secondary | ICD-10-CM | POA: Diagnosis not present

## 2022-04-21 DIAGNOSIS — R2681 Unsteadiness on feet: Secondary | ICD-10-CM | POA: Diagnosis not present

## 2022-04-21 DIAGNOSIS — J449 Chronic obstructive pulmonary disease, unspecified: Secondary | ICD-10-CM | POA: Diagnosis not present

## 2022-04-21 DIAGNOSIS — K219 Gastro-esophageal reflux disease without esophagitis: Secondary | ICD-10-CM | POA: Diagnosis not present

## 2022-04-21 DIAGNOSIS — R5383 Other fatigue: Secondary | ICD-10-CM | POA: Diagnosis not present

## 2022-04-21 DIAGNOSIS — I10 Essential (primary) hypertension: Secondary | ICD-10-CM | POA: Diagnosis not present

## 2022-04-25 ENCOUNTER — Ambulatory Visit: Payer: Self-pay | Admitting: Licensed Clinical Social Worker

## 2022-04-25 NOTE — Patient Outreach (Signed)
Care Coordination   Follow Up Visit Note   [id number] Name: Kathy Pacheco MRN: [id number] DOB: [id number]  Kathy Pacheco is a 89 y.o. year old female who sees Thedore Mins, Ria Comment, [place] for primary care. I spoke with  Kathy Pacheco / Kathy Pacheco, daughter and contact of client, via phone today.   What matters to the patients health and wellness today? Patient stated she is grieving over  death of her sister in law    Goals Addressed               This Visit's Progress     Patient Stated she is grieving over death of her sister in law (pt-stated)        Interventions:  LCSW spoke via phone today with Kathy Pacheco, daughter of client. Kathy Pacheco is concerned about hygiene issues of client. Kathy Pacheco said sometimes client wears same clothes for a number of days. Kathy Pacheco said client does not take a bath or shower very often. Client seldom washes hair Kathy Pacheco said client had agreed to start taking Zoloft as prescribed. Kathy Pacheco said client is taking Zoloft as prescribed and Kathy Pacheco feels that Zoloft is helping client. Kathy Pacheco said client is going to  church more often and this had been her pattern in the pst. Discussed walking of client. Kathy Pacheco said client is walking well. Kathy Pacheco said client is sleeping well. Discussed client mood with Kathy Pacheco. Kathy Pacheco said that sister in law of client died recently and that this had been  difficult for client. Kathy Pacheco and LCSW spoke of symptoms of depression Discussed physical therapy support for client in the home. Kathy Pacheco said physical therapist just completed in home sessions for client. Related to social isolation issues of client, LCSW suggested that family members offer to take client out for a meal  Occasionally. This would allow client to be out of home and perhaps take a bath and change clothes to prepare to gou out for a meal. Kathy Pacheco said she thought this was good idea; she will talk with some family members to see if this approach might work, to allow increased client  socialization and to help her engage more in hygiene care Discussed hearing needs of client. Kathy Pacheco said client has been wearing her hearing aids often Encouraged Kathy Pacheco or client to call LCSW for SW support for client , at [phone].    SDOH assessments and interventions completed:  Yes  SDOH Interventions Today    Flowsheet Row Most Recent Value  SDOH Interventions   Depression Interventions/Treatment  Medication  Physical Activity Interventions Other (Comments)  [client recently completed in home physical therapy sessions]  Stress Interventions Other (Comment)  [client is neglecting hygiene issues. may wear clothes for several days without changing clothes. May not wash hair very often. may not take shower or bath very often]        Care Coordination Interventions:  Yes, provided   Interventions Today    Flowsheet Row Most Recent Value  Chronic Disease   Chronic disease during today's visit Other  [spoke with Kathy Pacheco, daughter of client, about client needs]  General Interventions   General Interventions Discussed/Reviewed General Interventions Discussed, Community Resources  [discussed program support]  Exercise Interventions   Exercise Discussed/Reviewed Physical Activity  Education Interventions   Education Provided Provided Education  Provided Verbal Education On [employer]  Mental Health Interventions   Mental Health Discussed/Reviewed Anxiety, Coping Strategies  [client is taking Zoloft as prescribed]  Pharmacy Interventions   Pharmacy Dicussed/Reviewed Medication  Adherence  Safety Interventions   Safety Discussed/Reviewed Fall Risk        Follow up plan: Follow up call scheduled for 04/26/22 at 10:30 AM    Encounter Outcome:  Pt. Visit Completed   Norva Riffle.Brittanyann Wittner MSW, [place] Holiday representative [facility] Care Management [phone]

## 2022-04-25 NOTE — Patient Instructions (Signed)
Visit Information  Thank you for taking time to visit with me today. Please don't hesitate to contact me if I can be of assistance to you.   Following are the goals we discussed today:   Goals Addressed               This Visit's Progress     Patient Stated she is grieving over death of her sister in law (pt-stated)        Interventions:  LCSW spoke via phone today with Darnelle Catalan, daughter of client. [name] is concerned about hygiene issues of client. [name] said sometimes client wears same clothes for a number of days. [name] said client does not take a bath or shower very often. Client seldom washes hair [name] said client had agreed to start taking Zoloft as prescribed. [name] said client is taking Zoloft as prescribed and [name] feels that Zoloft is helping client. [name] said client is going to  church more often and this had been her pattern in the pst. Discussed walking of client. [name] said client is walking well. [name] said client is sleeping well. Discussed client mood with Darnelle Catalan. [name] said that sister in law of client died recently and that this had been  difficult for client. [name] and LCSW spoke of symptoms of depression Discussed physical therapy support for client in the home. [name] said physical therapist just completed in home sessions for client. Related to social isolation issues of client, LCSW suggested that family members offer to take client out for a meal  Occasionally. This would allow client to be out of home and perhaps take a bath and change clothes to prepare to gou out for a meal. [name] said she thought this was good idea; she will talk with some family members to see if this approach might work, to allow increased client socialization and to help her engage more in hygiene care Discussed hearing needs of client. [name] said client has been wearing her hearing aids often Encouraged [name] or client to call LCSW for SW support for client , at [phone].     Our  next appointment is by telephone on 04/26/22 at 10:30 AM    Please call the care guide team at [phone] if you need to cancel or reschedule your appointment.   If you are experiencing a Mental Health or [place] or need someone to talk to, please go to [facility] [place] [phone])   The patient / Darnelle Catalan, daughter of patient,  verbalized understanding of instructions, educational materials, and care plan provided today and DECLINED offer to receive copy of patient instructions, educational materials, and care plan.   The patient / Darnelle Catalan, daughter of patient, has been provided with contact information for the care management team and has been advised to call with any health related questions or concerns.   Norva Riffle.Hiliary Osorto MSW, [place] Holiday representative [facility] Care Management [phone]

## 2022-04-26 ENCOUNTER — Ambulatory Visit: Payer: Self-pay | Admitting: Licensed Clinical Social Worker

## 2022-04-26 NOTE — Patient Instructions (Signed)
Visit Information  Thank you for taking time to visit with me today. Please don't hesitate to contact me if I can be of assistance to you.   Following are the goals we discussed today:   Goals Addressed               This Visit's Progress     Patient Stated she is grieving over death of her sister in law (pt-stated)        Interventions:  LCSW spoke via phone today with  Kathy Pacheco about her current needs Discussed grief issues of client. Sister in law of client died recently and client has been grieving this loss. Client said she lived next to sister in law and that they talked regularly when she was living. Discussed local Hospice support services in managing grief issues. Mentioned Hospice support group and mentioned chaplain support with local Hospice. Reviewed pain issues of client. Reviewed meal preparation. She said she cooks some meals. She also said she receives Meals on Wheels as scheduled weekly Reviewed ADLs completion.  Darnelle Catalan, daughter of client, had informed LCSW yesterday that client is not bathing very often and wears same clothes for several days. Client told LCSW today that she takes several shower per week and is dressing with no problem . [name], daughter, is concerned about hygiene issues of client.  Discussed client use of Zoloft to help with mood. Client said she is taking Zoloft and feels that Zoloft is helping her. She feels that her mood is stable now. Encouraged client or her daughter, [name], to call LCSW for SW support for client , at [phone].          Our next appointment is by telephone on 06/06/22 at 2:00 PM   Please call the care guide team at [phone] if you need to cancel or reschedule your appointment.   If you are experiencing a Mental Health or [place] or need someone to talk to, please go to [facility] [place] [phone])   The patient verbalized  understanding of instructions, educational materials, and care plan provided today and DECLINED offer to receive copy of patient instructions, educational materials, and care plan.   The patient has been provided with contact information for the care management team and has been advised to call with any health related questions or concerns.   Norva Riffle.Lariyah Shetterly MSW, [place] Holiday representative [facility] Care Management [phone]

## 2022-04-26 NOTE — Patient Outreach (Signed)
  Care Coordination   Follow Up Visit Note   [id number] Name: Kathy Pacheco MRN: [id number] DOB: [id number]  Kathy Pacheco is a 89 y.o. year old female who sees Thedore Mins, Ria Comment, [place] for primary care. I spoke with  Kathy Pacheco by phone today.  What matters to the patients health and wellness today? Patient is grieving over death of her sister  in law     Goals Addressed               This Visit's Progress     Patient Stated she is grieving over death of her sister in law (pt-stated)        Interventions:  LCSW spoke via phone today with  Kathy Pacheco about her current needs Discussed grief issues of client. Sister in law of client died recently and client has been grieving this loss. Client said she lived next to sister in law and that they talked regularly when she was living. Discussed local Hospice support services in managing grief issues. Mentioned Hospice support group and mentioned chaplain support with local Hospice. Reviewed pain issues of client. Reviewed meal preparation. She said she cooks some meals. She also said she receives Meals on Wheels as scheduled weekly Reviewed ADLs completion.  Kathy Pacheco, daughter of client, had informed LCSW yesterday that client is not bathing very often and wears same clothes for several days. Client told LCSW today that she takes several shower per week and is dressing with no problem . Kathy Pacheco, daughter, is concerned about hygiene issues of client.  Discussed client use of Zoloft to help with mood. Client said she is taking Zoloft and feels that Zoloft is helping her. She feels that her mood is stable now. Encouraged client or her daughter, Kathy Pacheco, to call LCSW for SW support for client , at [phone].          SDOH assessments and interventions completed:  Yes  SDOH Interventions Today    Flowsheet Row Most Recent Value  SDOH Interventions   Depression Interventions/Treatment  Counseling  Physical Activity  Interventions Other (Comments)  [may socially isolate occasionally. she does like to walk outdoors occasionally]  Stress Interventions Provide Counseling  [has stress occasionally related to completing ADLs]        Care Coordination Interventions:  Yes, provided  Interventions Today    Flowsheet Row Most Recent Value  Chronic Disease   Chronic disease during today's visit Other  [LCSW spoke with Kathy Pacheco about her current needs]  General Interventions   General Interventions Discussed/Reviewed General Interventions Discussed, [employer]  [discussed program support]  Exercise Interventions   Exercise Discussed/Reviewed Physical Activity  [likes to walk outdoors occasionally]  Education Interventions   Education Provided Provided Education  Provided Verbal Education On [employer]  Mental Health Interventions   Mental Health Discussed/Reviewed Anxiety, Coping Strategies  [discussed relaxation skills such as taking a walk, visiting with neighbors, talking via phone with family members]  Nutrition Interventions   Nutrition Discussed/Reviewed Nutrition Discussed  [she spoke of support from Meals on Wheels]  Safety Interventions   Safety Discussed/Reviewed Fall Risk  [discussed shower safety. she feels that her shower is safe]        Follow up plan: Follow up call scheduled for 06/06/22 at 2:00 PM     Encounter Outcome:  Pt. Visit Completed   Norva Riffle.Brailyn Delman MSW, [place] [employer] representative [facility] Care Management [phone]

## 2022-05-15 ENCOUNTER — Encounter: Payer: Self-pay | Admitting: Physician Assistant

## 2022-05-15 ENCOUNTER — Ambulatory Visit (INDEPENDENT_AMBULATORY_CARE_PROVIDER_SITE_OTHER): Payer: Medicare Other | Admitting: Physician Assistant

## 2022-05-15 VITALS — BP 123/62 | HR 72 | Wt 114.7 lb

## 2022-05-15 DIAGNOSIS — I10 Essential (primary) hypertension: Secondary | ICD-10-CM

## 2022-05-15 DIAGNOSIS — F320 Major depressive disorder, single episode, mild: Secondary | ICD-10-CM | POA: Diagnosis not present

## 2022-05-15 NOTE — Progress Notes (Unsigned)
I,[name],acting as a Neurosurgeon for [employer], PA-C.,have documented all relevant documentation on the behalf of Alfredia Ferguson, PA-C,as directed by  Alfredia Ferguson, PA-C while in the presence of Alfredia Ferguson, PA-C.   Established patient visit   Patient: Kathy Pacheco   DOB: July 10, 1931   89 y.o. Female  MRN: [id number] Visit Date: 05/15/2022  Today's healthcare provider: Alfredia Ferguson, PA-C   No chief complaint on file.  Subjective    HPI  Hypertension, follow-up  BP Readings from Last 3 Encounters:  05/15/22 123/62  04/05/22 107/68  02/27/22 122/70   Wt Readings from Last 3 Encounters:  05/15/22 114 lb 11.2 oz (52 kg)  04/05/22 119 lb 14.4 oz (54.4 kg)  02/27/22 120 lb 14.4 oz (54.8 kg)     She was last seen for hypertension 2 months ago.  BP at that visit was 121/69. Management since that visit includes controlled with lifestyle modifications, will continue to monitor .  She reports excellent compliance with treatment. She is not having side effects. {document side effects if present:1} She is following a Regular diet. She is exercising. She does not smoke.  Outside blood pressures are {not being checked}. Symptoms: No chest pain No chest pressure  No palpitations No syncope  No dyspnea No orthopnea  No paroxysmal nocturnal dyspnea No lower extremity edema   Pertinent labs Lab Results  Component Value Date   CHOL 165 11/16/2021   HDL 51 11/16/2021   LDLCALC 92 11/16/2021   TRIG 121 11/16/2021   CHOLHDL 3.2 11/16/2021   Lab Results  Component Value Date   NA 140 11/16/2021   K 3.7 11/16/2021   CREATININE 1.09 (H) 11/16/2021   EGFR 49 (L) 11/16/2021   GLUCOSE 89 11/16/2021   TSH 1.530 08/26/2020     The ASCVD Risk score (Arnett DK, et al., 2019) failed to calculate for the following reasons:   The 2019 ASCVD risk score is only valid for ages 65 to  31  ---------------------------------------------------------------------------------------------------  Lipid/Cholesterol, Follow-up  Last lipid panel Other pertinent labs  Lab Results  Component Value Date   CHOL 165 11/16/2021   HDL 51 11/16/2021   LDLCALC 92 11/16/2021   TRIG 121 11/16/2021   CHOLHDL 3.2 11/16/2021   Lab Results  Component Value Date   ALT 5 11/16/2021   AST 12 11/16/2021   PLT 309 11/16/2021   TSH 1.530 08/26/2020     She was last seen for this 6 months ago.  Management since that visit includes continue current treatment.  She reports excellent compliance with treatment. She is not having side effects. {document side effects if present:1}  Symptoms: No chest pain No chest pressure/discomfort  No dyspnea No lower extremity edema  No numbness or tingling of extremity No orthopnea  No palpitations No paroxysmal nocturnal dyspnea  No speech difficulty No syncope   Current diet: well balanced Current exercise: walking  The ASCVD Risk score (Arnett DK, et al., 2019) failed to calculate for the following reasons:   The 2019 ASCVD risk score is only valid for ages 77 to 69  ---------------------------------------------------------------------------------------------------  Depression, Follow-up  She  was last seen for this 1 months ago. Changes made at last visit include rx zoloft 25 mg daily. .   She reports excellent compliance with treatment. She is not having side effects.   She reports good tolerance of treatment. Current symptoms include:  none She feels she is Improved since last visit.  05/15/2022    1:44 PM 04/26/2022   11:17 AM 04/25/2022    4:04 PM  Depression screen PHQ 2/9  Decreased Interest 2 2 2   Down, Depressed, Hopeless 2 1 2   PHQ - 2 Score 4 3 4   Altered sleeping 2 0 0  Tired, decreased energy 2 2 2   Change in appetite 0 0 0  Feeling bad or failure about yourself  1 1 2   Trouble concentrating 2 2 2   Moving slowly or  fidgety/restless 3 2 2   Suicidal thoughts 0 0 0  PHQ-9 Score 14 10 12   Difficult doing work/chores Somewhat difficult Somewhat difficult Somewhat difficult    -----------------------------------------------------------------------------------------   Medications: Outpatient Medications Prior to Visit  Medication Sig   acetaminophen (TYLENOL) 500 MG tablet Take 500 mg by mouth every 6 (six) hours as needed for mild pain.   albuterol (VENTOLIN HFA) 108 (90 Base) MCG/ACT inhaler Inhale 2 puffs into the lungs every 6 (six) hours as needed for wheezing or shortness of breath.   amLODipine (NORVASC) 5 MG tablet Take 1 tablet (5 mg total) by mouth daily.   aspirin EC 81 MG tablet Take 81 mg by mouth daily. Swallow whole.   famotidine (PEPCID) 40 MG tablet Take 40 mg by mouth at bedtime.   ondansetron (ZOFRAN ODT) 4 MG disintegrating tablet Allow 1-2 tablets to dissolve in your mouth every 8 hours as needed for nausea/vomiting   pantoprazole (PROTONIX) 40 MG tablet 40 mg 2 (two) times daily. 30 minutes before meals   rosuvastatin (CRESTOR) 10 MG tablet TAKE 1 TABLET BY MOUTH EVERY DAY   sertraline (ZOLOFT) 25 MG tablet Take 1 tablet (25 mg total) by mouth daily.   triamcinolone cream (KENALOG) 0.1 % Apply twice daily to affected areas on body up to 2 weeks as needed for itching. Avoid applying to face, groin, and axilla.   No facility-administered medications prior to visit.    Review of Systems  {Labs  Heme  Chem  Endocrine  Serology  Results Review (optional):23779}   Objective    BP 123/62 (BP Location: Right Arm, Patient Position: Sitting, Cuff Size: Normal)   Pulse 72   Wt 114 lb 11.2 oz (52 kg)   SpO2 100%   BMI 20.32 kg/m  {Show previous vital signs (optional):23777}  Physical Exam  ***  No results found for any visits on 05/15/22.  Assessment & Plan     ***  No follow-ups on file.      {provider attestation***:1}   Alfredia Ferguson, PA-C  [facility]  Family Practice [phone] (phone) [phone] (fax)  [facility]

## 2022-05-16 ENCOUNTER — Encounter: Payer: Self-pay | Admitting: Physician Assistant

## 2022-05-16 ENCOUNTER — Other Ambulatory Visit: Payer: Self-pay

## 2022-05-16 DIAGNOSIS — N189 Chronic kidney disease, unspecified: Secondary | ICD-10-CM

## 2022-05-16 LAB — COMPREHENSIVE METABOLIC PANEL
ALT: 10 IU/L (ref 0–32)
AST: 13 IU/L (ref 0–40)
Albumin/Globulin Ratio: 1.5 (ref 1.2–2.2)
Albumin: 4 g/dL (ref 3.7–4.7)
Alkaline Phosphatase: 83 IU/L (ref 44–121)
BUN/Creatinine Ratio: 14 (ref 12–28)
BUN: 19 mg/dL (ref 8–27)
Bilirubin Total: 0.2 mg/dL (ref 0.0–1.2)
CO2: 24 mmol/L (ref 20–29)
Calcium: 9.4 mg/dL (ref 8.7–10.3)
Chloride: 104 mmol/L (ref 96–106)
Creatinine, Ser: 1.38 mg/dL — ABNORMAL HIGH (ref 0.57–1.00)
Globulin, Total: 2.6 g/dL (ref 1.5–4.5)
Glucose: 114 mg/dL — ABNORMAL HIGH (ref 70–99)
Potassium: 4.5 mmol/L (ref 3.5–5.2)
Sodium: 140 mmol/L (ref 134–144)
Total Protein: 6.6 g/dL (ref 6.0–8.5)
eGFR: 37 mL/min/{1.73_m2} — ABNORMAL LOW (ref >59)

## 2022-05-16 NOTE — Assessment & Plan Note (Addendum)
Well controlled Managed with amlodipine 5 mg  Will repeat cmp

## 2022-05-16 NOTE — Assessment & Plan Note (Signed)
Noted improvement with sertraline 25 mg. Ideally I would like to increase 50 mg as I feel strongly it would benefit her Discussed with pt at length, she declines. Discussed hygiene with pt, she reports often taking care of herself, getting her hair cut.  Pt does not appear dirty and there is no unusual odor today. Will continue to discuss with patient. This may take some time as it seems there is underlying anxiety regarding hygiene in general that patient does not want to discuss.

## 2022-05-23 ENCOUNTER — Ambulatory Visit: Payer: Self-pay | Admitting: Licensed Clinical Social Worker

## 2022-05-23 NOTE — Patient Instructions (Signed)
Visit Information  Thank you for taking time to visit with me today. Please don't hesitate to contact me if I can be of assistance to you.   Following are the goals we discussed today:   Goals Addressed             This Visit's Progress    Patient has hygiene challenges. She does not bathe very often       Interventions: LCSW spoke via phone today with Kathy Pacheco, daughter of client Kathy Pacheco is concerned over hygiene issues of client. She said client is scheduled for an appointment with a dermatologist next week Kathy Pacheco said client does not bathe very often. Client will sometimes wear clothes for multiple days Kathy Pacheco and LCSW  spoke of ambulation of client, appetite of client and sleeping of client Client is taking medications as prescribed. Client takes Zoloft as prescribed. Kathy Pacheco feels that Zoloft is helping client  Client is interacting more socially. Kathy Pacheco said client has been to church 4 Sundays in a row. Reminded Kathy Pacheco of program support for client with RN , LCSW an Pharmacist Encouraged Kathy Pacheco or client to call LCSW as needed at [phone]       LCSW to call client/ Kathy Pacheco, daughter of client, as scheduled  Please call the care guide team at [phone] if you need to cancel or reschedule your appointment.   If you are experiencing a Mental Health or Behavioral Health Crisis or need someone to talk to, please go to [facility] [address] [place] [phone])   The patient / Kathy Pacheco, daughter of patient, verbalized understanding of instructions, educational materials, and care plan provided today and DECLINED offer to receive copy of patient instructions, educational materials, and care plan.   The patient/ Kathy Pacheco, daughter of patient,  has been provided with contact information for the care management team and has been advised to call with any health related questions or concerns.   Kelton Pillar.Beya Tipps MSW,  LCSW Licensed Visual merchandiser [facility] Care Management [phone]

## 2022-05-23 NOTE — Patient Outreach (Signed)
  Care Coordination   Follow Up Visit Note   05/23/2022 Name: Kathy Pacheco MRN: [id number] DOB: [birth year]  Kathy Pacheco is a 89 y.o. year old female who sees Kathy Pacheco, Kathy Pacheco, Kathy Pacheco for primary care. I spoke with  Kathy Pacheco / Kathy Pacheco, daughter of Kathy Pacheco, via phone today .  What matters to the patients health and wellness today?  Patient has hygiene challenges. She does  not bathe very often    Goals Addressed             This Visit's Progress    Patient has hygiene challenges. She does not bathe very often       Interventions: LCSW spoke via phone today with Kathy Pacheco, daughter of Kathy Pacheco Kathy Pacheco is concerned over hygiene issues of Kathy Pacheco. She said Kathy Pacheco is scheduled for an appointment with a dermatologist next week Kathy Pacheco said Kathy Pacheco does not bathe very often. Kathy Pacheco will sometimes wear clothes for multiple days Kathy Pacheco and LCSW  spoke of ambulation of Kathy Pacheco, appetite of Kathy Pacheco and sleeping of Kathy Pacheco Kathy Pacheco is taking medications as prescribed. Kathy Pacheco takes Zoloft as prescribed. Kathy Pacheco feels that Zoloft is helping Kathy Pacheco  Kathy Pacheco is interacting more socially. Kathy Pacheco said Kathy Pacheco has been to church 4 Sundays in a row. Reminded Kathy Pacheco of program support for Kathy Pacheco with RN , LCSW an Pharmacist Encouraged Kathy Pacheco or Kathy Pacheco to call LCSW as needed at [phone]     SDOH assessments and interventions completed:  Yes  SDOH Interventions Today    Flowsheet Row Most Recent Value  SDOH Interventions   Depression Interventions/Treatment  Medication, Currently on Treatment  Stress Interventions Other (Comment)  [anxiety issues occasionally]        Care Coordination Interventions:  Yes, provided   Interventions Today    Flowsheet Row Most Recent Value  Chronic Disease   Chronic disease during today's visit Other  [spoke via phone with Kathy Pacheco, daughter of Kathy Pacheco, about Kathy Pacheco needs]  General Interventions   General Interventions Discussed/Reviewed General Interventions  Discussed, Community Resources  [reviewed program support.]  Exercise Interventions   Exercise Discussed/Reviewed Physical Activity  Education Interventions   Education Provided Provided Education  [Kathy Pacheco has hygiene issues. will not bathe very often. to see dermatologist next week for appointment]  Provided Verbal Education On Community Resources  Mental Health Interventions   Mental Health Discussed/Reviewed Anxiety  [occasional anxiety issues]  Nutrition Interventions   Nutrition Discussed/Reviewed Nutrition Discussed        Follow up plan: LCSW to call Kathy Pacheco / Kathy Pacheco, daughter of Kathy Pacheco, as scheduled    Encounter Outcome:  Pt. Visit Completed   Kelton Pillar.Seyed Heffley MSW, LCSW Licensed Visual merchandiser [facility] Care Management [phone]

## 2022-05-31 ENCOUNTER — Encounter: Payer: Self-pay | Admitting: Dermatology

## 2022-05-31 ENCOUNTER — Ambulatory Visit (INDEPENDENT_AMBULATORY_CARE_PROVIDER_SITE_OTHER): Payer: Medicare Other | Admitting: Dermatology

## 2022-05-31 DIAGNOSIS — L219 Seborrheic dermatitis, unspecified: Secondary | ICD-10-CM

## 2022-05-31 DIAGNOSIS — L2081 Atopic neurodermatitis: Secondary | ICD-10-CM | POA: Diagnosis not present

## 2022-05-31 DIAGNOSIS — N189 Chronic kidney disease, unspecified: Secondary | ICD-10-CM | POA: Diagnosis not present

## 2022-05-31 MED ORDER — KETOCONAZOLE 2 % EX SHAM: MEDICATED_SHAMPOO | CUTANEOUS | 5 refills | Status: DC

## 2022-05-31 MED ORDER — TRIAMCINOLONE ACETONIDE 0.1 % EX CREA: TOPICAL_CREAM | CUTANEOUS | 2 refills | Status: DC

## 2022-05-31 NOTE — Progress Notes (Signed)
   Follow Up Visit   Subjective  Kathy Pacheco is a 89 y.o. female who presents for the following: Rash. Scalp, back and face. Picks at scalp. Itches at times. States she washes hair once a week. Daughter states is every 2 months.   Patient accompanied by daughter who contributes to history.   The following portions of the chart were reviewed this encounter and updated as appropriate: medications, allergies, medical history  Review of Systems:  No other skin or systemic complaints except as noted in HPI or Assessment and Plan.  Objective  Well appearing patient in no apparent distress; mood and affect are within normal limits.  A focused examination was performed of the following areas: Scalp, face, back  Relevant exam findings are noted in the Assessment and Plan.    Assessment & Plan     Atopic Neurodermatitis Exam: pink excoriated papules at upper back, Crusted papules at frontal hair line.  Atopic dermatitis (eczema) is a chronic, relapsing, pruritic condition that can significantly affect quality of life. It is often associated with allergic rhinitis and/or asthma and can require treatment with topical medications, phototherapy, or in severe cases biologic injectable medication (Dupixent; Adbry) or Oral JAK inhibitors.   Avoid picking  Treatment Plan: Start Triamcinolone 0.1% cream twice daily to affected areas on body 2 weeks on 2 weeks off as needed for itching. Avoid applying to face, groin, and axilla. Use as directed. Long-term use can cause thinning of the skin. Apply to affected areas at frontal hair line once or twice daily until cleared up. Avoid applying to face.  Topical steroids (such as triamcinolone, fluocinolone, fluocinonide, mometasone, clobetasol, halobetasol, betamethasone, hydrocortisone) can cause thinning and lightening of the skin if they are used for too long in the same area. Your physician has selected the right strength medicine for your problem  and area affected on the body. Please use your medication only as directed by your physician to prevent side effects.   Recommend mild soap and moisturizing cream 1-2 times daily.  Gentle skin care handout provided.     SEBORRHEIC DERMATITIS Exam: Pink patches with greasy scale at scalp. Crusted papules at frontal hair line.   Chronic and persistent condition with duration or expected duration over one year. Condition is bothersome/symptomatic for patient. Currently flared.   Seborrheic Dermatitis is a chronic persistent rash characterized by pinkness and scaling most commonly of the mid face but also can occur on the scalp (dandruff), ears; mid chest, mid back and groin.  It tends to be exacerbated by stress and cooler weather.  People who have neurologic disease may experience new onset or exacerbation of existing seborrheic dermatitis.  The condition is not curable but treatable and can be controlled.  Treatment Plan: Start ketoconazole shampoo 2 to 3 times per week lather on scalp, leave on 5-8 minutes, rinse out. This can be followed by conditioner or shampoo and conditioner of your choice.     Return in about 2 months (around 07/31/2022) for Seborrheic Dermatitis Follow Up, Rash Follow Up.  I, Lawson Radar, CMA, am acting as scribe for Willeen Niece, MD.   Documentation: I have reviewed the above documentation for accuracy and completeness, and I agree with the above.  Willeen Niece, MD

## 2022-05-31 NOTE — Patient Instructions (Addendum)
Seborrheic Dermatitis  -  is a chronic persistent rash characterized by pinkness and scaling most commonly of the mid face but also can occur on the scalp (dandruff), ears; mid chest, mid back and groin.  It tends to be exacerbated by stress and cooler weather.  People who have neurologic disease may experience new onset or exacerbation of existing seborrheic dermatitis.  The condition is not curable but treatable and can be controlled.   Scalp:  Start Ketoconazole shampoo 2 to 3 times per week lather on scalp, leave on 5-8 minutes, rinse out. This can be followed by conditioner or shampoo and conditioner of your choice.   Wash hair at least twice a week. Avoid picking scalp.  Triamcinolone cream apply to affected areas at frontal hair line once or twice daily until cleared up.    Nummular Dermatitis  Back:  Start Triamcinolone 0.1% cream twice daily to affected areas on body 2 weeks on 2 weeks off as needed for itching. Avoid applying to face, groin, and axilla. Use as directed. Long-term use can cause thinning of the skin.  Avoid picking areas. Cover with band-aid if needed.   Gentle Skin Care Guide  1. Bathe no more than once a day. Bathe at least 3-4 times per week.   2. Avoid bathing in very hot water  3. Use a mild soap like Dove, Vanicream, Cetaphil, CeraVe. Can use Lever 2000 or Cetaphil antibacterial soap  4. Use soap only where you need it. On most days, use it under your arms, between your legs, and on your feet. Let the water rinse other areas unless visibly dirty.  5. When you get out of the bath/shower, use a towel to gently blot your skin dry, don't rub it.  6. While your skin is still a little damp, apply a moisturizing cream such as Vanicream, CeraVe, Cetaphil, Eucerin, Sarna lotion or plain Vaseline Jelly. For hands apply Neutrogena [place] Hand Cream or Excipial Hand Cream.  7. Reapply moisturizer any time you start to itch or feel dry.  8. Sometimes using  free and clear laundry detergents can be helpful. Fabric softener sheets should be avoided. Downy Free & Gentle liquid, or any liquid fabric softener that is free of dyes and perfumes, it acceptable to use  9. If your doctor has given you prescription creams you may apply moisturizers over them    Due to recent changes in healthcare laws, you may see results of your pathology and/or laboratory studies on MyChart before the doctors have had a chance to review them. We understand that in some cases there may be results that are confusing or concerning to you. Please understand that not all results are received at the same time and often the doctors may need to interpret multiple results in order to provide you with the best plan of care or course of treatment. Therefore, we ask that you please give [place] 2 business days to thoroughly review all your results before contacting the office for clarification. Should we see a critical lab result, you will be contacted sooner.   If You Need Anything After Your Visit  If you have any questions or concerns for your doctor, please call our main line at [phone] and press option 4 to reach your doctor's medical assistant. If no one answers, please leave a voicemail as directed and we will return your call as soon as possible. Messages left after 4 pm will be answered the following business day.   You may  also send [place] a message via MyChart. We typically respond to MyChart messages within 1-2 business days.  For prescription refills, please ask your pharmacy to contact our office. Our fax number is [phone].  If you have an urgent issue when the clinic is closed that cannot wait until the next business day, you can page your doctor at the number below.    Please note that while we do our best to be available for urgent issues outside of office hours, we are not available 24/7.   If you have an urgent issue and are unable to reach [place], you may choose to seek  medical care at your doctor's office, retail clinic, urgent care center, or emergency room.  If you have a medical emergency, please immediately call 911 or go to the emergency department.  Pager Numbers  - Dr. Gwen Pounds: [phone]  - Dr. Neale Burly: [phone]  - Dr. Roseanne Reno: [phone]  In the event of inclement weather, please call our main line at [phone] for an update on the status of any delays or closures.  Dermatology Medication Tips: Please keep the boxes that topical medications come in in order to help keep track of the instructions about where and how to use these. Pharmacies typically print the medication instructions only on the boxes and not directly on the medication tubes.   If your medication is too expensive, please contact our office at [phone] option 4 or send [place] a message through MyChart.   We are unable to tell what your co-pay for medications will be in advance as this is different depending on your insurance coverage. However, we may be able to find a substitute medication at lower cost or fill out paperwork to get insurance to cover a needed medication.   If a prior authorization is required to get your medication covered by your insurance company, please allow [place] 1-2 business days to complete this process.  Drug prices often vary depending on where the prescription is filled and some pharmacies may offer cheaper prices.  The website www.goodrx.com contains coupons for medications through different pharmacies. The prices here do not account for what the cost may be with help from insurance (it may be cheaper with your insurance), but the website can give you the price if you did not use any insurance.  - You can print the associated coupon and take it with your prescription to the pharmacy.  - You may also stop by our office during regular business hours and pick up a GoodRx coupon card.  - If you need your prescription sent electronically to a  different pharmacy, notify our office through [facility] or by phone at [phone] option 4.     Si Usted Necesita Algo Despus de Su Visita  Tambin puede enviarnos un mensaje a travs de Clinical cytogeneticist. Por lo general respondemos a los mensajes de MyChart en el transcurso de 1 a 2 das hbiles.  Para renovar recetas, por favor pida a su farmacia que se ponga en contacto con nuestra oficina. Annie Sable de fax es [place] [phone].  Si tiene un asunto urgente cuando la clnica est cerrada y que no puede esperar hasta el siguiente da hbil, puede llamar/localizar a su doctor(a) al nmero que aparece a continuacin.   Por favor, tenga en cuenta que aunque hacemos todo lo posible para estar disponibles para asuntos urgentes fuera del horario de [place], no estamos disponibles las 24 horas del da, los 7 [address]  [address] de la [place].   Si  tiene un problema urgente y no puede comunicarse con nosotros, puede optar por buscar atencin mdica  en el consultorio de su doctor(a), en una clnica privada, en un centro de atencin urgente o en una sala de emergencias.  Si tiene Engineer, drilling, por favor llame inmediatamente al 911 o vaya a la sala de emergencias.  Nmeros de bper  - Dr. Gwen Pounds: [phone]  - Dra. Moye: [phone]  - Dra. Roseanne Reno: [phone]  En caso de inclemencias del [place], por favor llame a Lacy Duverney principal al [phone] para una actualizacin sobre el [place] de cualquier retraso o cierre.  Consejos para la medicacin en dermatologa: Por favor, guarde las cajas en las que vienen los medicamentos de uso tpico para ayudarle a seguir las instrucciones sobre dnde y cmo usarlos. Las farmacias generalmente imprimen las instrucciones del medicamento slo en las cajas y no directamente en los tubos del [place].   Si su medicamento es muy caro, por favor, pngase en contacto con Rolm Gala llamando al [phone] y presione la opcin 4 o envenos un  mensaje a travs de Clinical cytogeneticist.   No podemos decirle cul ser su copago por los medicamentos por adelantado ya que esto es diferente dependiendo de la cobertura de su seguro. Sin embargo, es posible que podamos encontrar un medicamento sustituto a Audiological scientist un formulario para que el seguro cubra el medicamento que se considera necesario.   Si se requiere una autorizacin previa para que su compaa de seguros [place] su medicamento, por favor permtanos de 1 a 2 das hbiles para completar [address]  Los precios de los medicamentos varan con frecuencia dependiendo del Environmental consultant de dnde se surte la receta y alguna farmacias pueden ofrecer precios ms baratos.  El sitio web www.goodrx.com tiene cupones para medicamentos de Health and safety inspector. Los precios aqu no tienen en cuenta lo que podra costar con la ayuda del seguro (puede ser ms barato con su seguro), pero el sitio web puede darle el precio si no utiliz Tourist information centre manager.  - Puede imprimir el cupn correspondiente y llevarlo con su receta a la farmacia.  - Tambin puede pasar por nuestra oficina durante el horario de atencin regular y Education officer, museum una tarjeta de cupones de GoodRx.  - Si necesita que su receta se enve electrnicamente a una farmacia diferente, informe a nuestra oficina a travs de MyChart de [place] o por telfono llamando al [phone] y presione la opcin 4.

## 2022-06-01 ENCOUNTER — Other Ambulatory Visit: Payer: Self-pay | Admitting: Physician Assistant

## 2022-06-01 DIAGNOSIS — N1832 Chronic kidney disease, stage 3b: Secondary | ICD-10-CM

## 2022-06-01 LAB — BASIC METABOLIC PANEL
BUN/Creatinine Ratio: 12 (ref 12–28)
BUN: 18 mg/dL (ref 8–27)
CO2: 18 mmol/L — ABNORMAL LOW (ref 20–29)
Calcium: 7.7 mg/dL — ABNORMAL LOW (ref 8.7–10.3)
Chloride: 104 mmol/L (ref 96–106)
Creatinine, Ser: 1.47 mg/dL — ABNORMAL HIGH (ref 0.57–1.00)
Glucose: 112 mg/dL — ABNORMAL HIGH (ref 70–99)
Potassium: 4.6 mmol/L (ref 3.5–5.2)
Sodium: 141 mmol/L (ref 134–144)
eGFR: 34 mL/min/{1.73_m2} — ABNORMAL LOW (ref >59)

## 2022-06-01 NOTE — Telephone Encounter (Signed)
Copied from CRM [id number]. Topic: Referral - Question >> Jun 01, 2022  9:19 AM Pincus Sanes wrote: Reason for CRM: Pt daughter, [name] has called thinking the office called her, Okey Regal states was re referral. Please reach out to daughter re mother's referral  [phone], states to give her 2 hr as has an appt   I am unsure of who has called her being that the referral has not been processed yet.   I did not see anything in the chart where someone has tried to reach her, but I do see she has an upcoming appt on 06/06/22 so it might have been a reminder call.

## 2022-06-06 ENCOUNTER — Ambulatory Visit: Payer: Self-pay | Admitting: Licensed Clinical Social Worker

## 2022-06-06 NOTE — Patient Outreach (Signed)
  Care Coordination   Follow Up Visit Note   06/06/2022 Name: Kathy Pacheco MRN: [id number] DOB: February 06, 1932  Kathy Pacheco is a 89 y.o. year old female who sees Ok Edwards, Lillia Abed, [place] for primary care. I spoke with  [name] /  [name], daughter of client , via phone about client needs .   What matters to the patients health and wellness today? Client has anxiety issues in some social settings She does not want to appear as if she needs help with daily activities    Goals Addressed             This Visit's Progress    patient has anxiety issues in some social settings. She is not wanting to appear as if she needs help with daily activities.       Interventions: Spoke with [name] , daughter of client, about client needs. LCSW talked with [name] about hygiene challenges of client. Client is often resistant to bathing Discussed program support for client Discussed medication procurement of client. Discussed sleeping of client. Discussed transport of client Reviewed with [name] the ambulation needs of client. [name] said client is walking well at present Reviewed meals provision of client. Client receives Meals on Wheels currently. [name] said these meals are helpful to client Client has hobbies of watching TV, walking short distances, and does enjoy singing Provided counseling support to [name] related to managing the needs of client Encouraged [name] to call LCSW as needed at [phone]        SDOH assessments and interventions completed:  Yes  SDOH Interventions Today    Flowsheet Row Most Recent Value  SDOH Interventions   Depression Interventions/Treatment  Currently on Treatment, Medication  Physical Activity Interventions Other (Comments)  [some walking challenges]  Stress Interventions Other (Comment)  [stress related to managing medical needs]        Care Coordination Interventions:  Yes, provided   Interventions Today    Flowsheet  Row Most Recent Value  Chronic Disease   Chronic disease during today's visit Other  [spoke with [name], daughter of client, about client needs]  General Interventions   General Interventions Discussed/Reviewed General Interventions Discussed, [employer]  [discussed program support for client]  Exercise Interventions   Exercise Discussed/Reviewed Physical Activity  Education Interventions   Education Provided Provided Education  Provided Verbal Education On [employer] hygiene challenges for client. client using prescribed shampoo for her scalp. Needs help with some ADLs]  Mental Health Interventions   Mental Health Discussed/Reviewed Anxiety, Coping Strategies  [client is anxious in some social settings. has stress and anxiety issues she faces]  Nutrition Interventions   Nutrition Discussed/Reviewed Nutrition Discussed  Pharmacy Interventions   Pharmacy Dicussed/Reviewed Pharmacy Topics Discussed  Safety Interventions   Safety Discussed/Reviewed Fall Risk        Follow up plan: Follow up call scheduled for 07/17/22 at 10:30 AM    Encounter Outcome:  Pt. Visit Completed   Kelton Pillar.Olamide Carattini MSW, LCSW Licensed Visual merchandiser [facility] Care Management [phone]

## 2022-06-06 NOTE — Patient Instructions (Signed)
Visit Information  Thank you for taking time to visit with me today. Please don't hesitate to contact me if I can be of assistance to you.   Following are the goals we discussed today:   Goals Addressed             This Visit's Progress    patient has anxiety issues in some social settings. She is not wanting to appear as if she needs help with daily activities.       Interventions: Spoke with [name] , daughter of client, about client needs. LCSW talked with [name] about hygiene challenges of client. Client is often resistant to bathing Discussed program support for client Discussed medication procurement of client. Discussed sleeping of client. Discussed transport of client Reviewed with [name] the ambulation needs of client. [name] said client is walking well at present Reviewed meals provision of client. Client receives Meals on Wheels currently. [name] said these meals are helpful to client Client has hobbies of watching TV, walking short distances, and does enjoy singing Provided counseling support to [name] related to managing the needs of client Encouraged [name] to call LCSW as needed at [phone]        Our next appointment is by telephone on 07/17/22 at 10:30 AM   Please call the care guide team at [phone] if you need to cancel or reschedule your appointment.   If you are experiencing a Mental Health or Behavioral Health Crisis or need someone to talk to, please go to [facility] [address] [place] [phone])   The patient / [name],  daughter, verbalized understanding of instructions, educational materials, and care plan provided today and DECLINED offer to receive copy of patient instructions, educational materials, and care plan.   The patient / [name], daughter, has been provided with contact information for the care management team and has been advised to call with any health related  questions or concerns.   Kelton Pillar.Yanelly Cantrelle MSW, LCSW Licensed Visual merchandiser [facility] Care Management [phone]

## 2022-06-20 DIAGNOSIS — H903 Sensorineural hearing loss, bilateral: Secondary | ICD-10-CM | POA: Diagnosis not present

## 2022-07-17 ENCOUNTER — Ambulatory Visit: Payer: Self-pay | Admitting: Licensed Clinical Social Worker

## 2022-07-17 NOTE — Patient Instructions (Signed)
Visit Information  Thank you for taking time to visit with me today. Please don't hesitate to contact me if I can be of assistance to you.   Following are the goals we discussed today:   Goals Addressed             This Visit's Progress    patient has anxiety issues in some social settings. She is not wanting to appear as if she needs help with daily activities.       Interventions:  Spoke with client about client needs. Spoke with client about meals provision. She receives Meals on Wheels support 5 days weekly Discussed in home assistance . She receives help from home health aide on Mondays (9:00 AM to 12:OO)  And on Fridays (9:00 AM to 12:00 ).  She said that help from home health aide is very useful to her LCSW also talked via phone today with [name], daughter of client. [name] said client was bathing more now and [name] feels that home health aide is helping client when aide is with client Discussed sleeping of client. Discussed walking of client. Client likes to take short walks for exercise [name] said client is anxious occasionally .[name] said client grew up poor and [name] feels that this is affecting client's view of things now . Client does not want to waste resources Encouraged client or [name] to call LCSW as needed for SW support for client at [phone].           Our next appointment is by telephone on 09/11/22 at 10:00 AM   Please call the care guide team at [phone] if you need to cancel or reschedule your appointment.   If you are experiencing a Mental Health or Behavioral Health Crisis or need someone to talk to, please go to [facility] [address] [place] [phone])   The patient verbalized understanding of instructions, educational materials, and care plan provided today and DECLINED offer to receive copy of patient instructions, educational materials, and care plan.   The patient has been provided with  contact information for the care management team and has been advised to call with any health related questions or concerns.   Kelton Pillar.Theresia Pree MSW, LCSW Licensed Visual merchandiser [facility] Care Management [phone]

## 2022-07-17 NOTE — Patient Outreach (Signed)
  Care Coordination   Follow Up Visit Note   07/17/2022 Name: Kathy Pacheco MRN: [id number] DOB: Jul 04, 1931  Kathy Pacheco is a 89 y.o. year old female who sees Ok Edwards, Lillia Abed, [place] for primary care. I spoke with  Kathy Pacheco / Kathy Pacheco, daughter of client via phone today.  What matters to the patients health and wellness today? Patient may have anxiety issues in some social settings.     Goals Addressed             This Visit's Progress    patient has anxiety issues in some social settings. She is not wanting to appear as if she needs help with daily activities.       Interventions:  Spoke with client about client needs. Spoke with client about meals provision. She receives Meals on Wheels support 5 days weekly Discussed in home assistance . She receives help from home health aide on Mondays (9:00 AM to 12:OO)  And on Fridays (9:00 AM to 12:00 ).  She said that help from home health aide is very useful to her LCSW also talked via phone today with Kathy Pacheco, daughter of client. Kathy Pacheco said client was bathing more now and Kathy Pacheco feels that home health aide is helping client when aide is with client Discussed sleeping of client. Discussed walking of client. Client likes to take short walks for exercise Kathy Pacheco said client is anxious occasionally .Kathy Pacheco said client grew up poor and Kathy Pacheco feels that this is affecting client's view of things now . Client does not want to waste resources Encouraged client or Kathy Pacheco to call LCSW as needed for SW support for client at [phone].           SDOH assessments and interventions completed:  Yes  SDOH Interventions Today    Flowsheet Row Most Recent Value  SDOH Interventions   Depression Interventions/Treatment  Medication, Currently on Treatment  Physical Activity Interventions Other (Comments)  [may have some walking challenges]  Stress Interventions Other (Comment)  [client may have stress in managing medical needs]         Care Coordination Interventions:  Yes, provided   Interventions Today    Flowsheet Row Most Recent Value  Chronic Disease   Chronic disease during today's visit Other  [spoke with client about client needs]  General Interventions   General Interventions Discussed/Reviewed General Interventions Discussed, Community Resources  [discussed program resources]  Exercise Interventions   Exercise Discussed/Reviewed Physical Activity  [client likes to take short walks]  Physical Activity Discussed/Reviewed Physical Activity Discussed  Education Interventions   Education Provided Provided Education  Provided Verbal Education On Community Resources  Mental Health Interventions   Mental Health Discussed/Reviewed --  Kathy Pacheco gets anxious occasionally in managing medical needs]  Nutrition Interventions   Nutrition Discussed/Reviewed Nutrition Discussed  [client gets Meals on wheels assistance 5 days weekly]  Pharmacy Interventions   Pharmacy Dicussed/Reviewed Pharmacy Topics Discussed  Safety Interventions   Safety Discussed/Reviewed Fall Risk        Follow up plan: Follow up call scheduled for 09/11/22 at 10:00 AM    Encounter Outcome:  Pt. Visit Completed   Kelton Pillar.Lilybeth Vien MSW, LCSW Licensed Visual merchandiser [facility] Care Management [phone]

## 2022-07-20 DIAGNOSIS — E872 Acidosis, unspecified: Secondary | ICD-10-CM | POA: Diagnosis not present

## 2022-07-20 DIAGNOSIS — R8290 Unspecified abnormal findings in urine: Secondary | ICD-10-CM | POA: Diagnosis not present

## 2022-07-20 DIAGNOSIS — I10 Essential (primary) hypertension: Secondary | ICD-10-CM | POA: Diagnosis not present

## 2022-07-20 DIAGNOSIS — K219 Gastro-esophageal reflux disease without esophagitis: Secondary | ICD-10-CM | POA: Diagnosis not present

## 2022-07-20 DIAGNOSIS — N1832 Chronic kidney disease, stage 3b: Secondary | ICD-10-CM | POA: Diagnosis not present

## 2022-07-20 DIAGNOSIS — F411 Generalized anxiety disorder: Secondary | ICD-10-CM | POA: Diagnosis not present

## 2022-07-24 ENCOUNTER — Other Ambulatory Visit: Payer: Self-pay | Admitting: Nephrology

## 2022-07-24 DIAGNOSIS — N1832 Chronic kidney disease, stage 3b: Secondary | ICD-10-CM

## 2022-07-24 DIAGNOSIS — R8290 Unspecified abnormal findings in urine: Secondary | ICD-10-CM

## 2022-07-27 ENCOUNTER — Encounter: Payer: Self-pay | Admitting: Physician Assistant

## 2022-07-31 ENCOUNTER — Ambulatory Visit: Payer: Medicare Other | Admitting: Dermatology

## 2022-07-31 VITALS — BP 122/65 | HR 66

## 2022-07-31 DIAGNOSIS — L219 Seborrheic dermatitis, unspecified: Secondary | ICD-10-CM

## 2022-07-31 DIAGNOSIS — L2081 Atopic neurodermatitis: Secondary | ICD-10-CM

## 2022-07-31 DIAGNOSIS — L720 Epidermal cyst: Secondary | ICD-10-CM

## 2022-07-31 DIAGNOSIS — L729 Follicular cyst of the skin and subcutaneous tissue, unspecified: Secondary | ICD-10-CM

## 2022-07-31 NOTE — Progress Notes (Signed)
Follow-Up Visit   Subjective  Kathy Pacheco is a 89 y.o. 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 She is much improved with triamcinolone 0.1% cream. She also has seborrheic dermatitis of the scalp, improved with ketoconazole 2% shampoo. She also has a growth on the back of her neck she would like checked. But not bothersome.  Patient accompanied by caregiver.    The following portions of the chart were reviewed this encounter and updated as appropriate: medications, allergies, medical history  Review of Systems:  No other skin or systemic complaints except as noted in HPI or Assessment and Plan.  Objective  Well appearing patient in no apparent distress; mood and affect are within normal limits.  A focused examination was performed of the following areas: Face, scalp Relevant physical exam findings are noted in the Assessment and Plan.    Assessment & Plan   SEBORRHEIC DERMATITIS Exam: Scalp clear, forehead clear, pt states not itching  Chronic condition with duration or expected duration over one year. Currently well-controlled.  Seborrheic Dermatitis is a chronic persistent rash characterized by pinkness and scaling most commonly of the mid face but also can occur on the scalp (dandruff), ears; mid chest, mid back and groin.  It tends to be exacerbated by stress and cooler weather.  People who have neurologic disease may experience new onset or exacerbation of existing seborrheic dermatitis.  The condition is not curable but treatable and can be controlled.  Treatment Plan: Continue ketoconazole 2% shampoo - apply to scalp 2-3x/wk, let sit several minutes before rinsing.   Atopic Neurodermatitis Exam: few healing excoriations on the upper back; hyperpigmented macules on the lower back.  Chronic condition with duration or expected duration over one year. Currently well-controlled.    Atopic  dermatitis (eczema) is a chronic, relapsing, pruritic condition that can significantly affect quality of life. It is often associated with allergic rhinitis and/or asthma and can require treatment with topical medications, phototherapy, or in severe cases biologic injectable medication (Dupixent; Adbry) or Oral JAK inhibitors.    Avoid picking   Treatment Plan: Continue Triamcinolone 0.1% cream twice daily PRN flares. Avoid applying to face, groin, and axilla. Use as directed. Long-term use can cause thinning of the skin.  Topical steroids (such as triamcinolone, fluocinolone, fluocinonide, mometasone, clobetasol, halobetasol, betamethasone, hydrocortisone) can cause thinning and lightening of the skin if they are used for too long in the same area. Your physician has selected the right strength medicine for your problem and area affected on the body. Please use your medication only as directed by your physician to prevent side effects.    Continue mild soap and moisturizing cream 1-2 times daily.    EPIDERMAL INCLUSION CYST Exam: 1.5 CM firm subcutaneous nodule at left posterior neck.  Benign-appearing. Exam most consistent with an epidermal inclusion cyst. Discussed that a cyst is a benign growth that can grow over time and sometimes get irritated or inflamed. Recommend observation if it is not bothersome. Discussed option of surgical excision to remove it if it is growing, symptomatic, or other changes noted. Please call for new or changing lesions so they can be evaluated.   Return if symptoms worsen or fail to improve.  ICherlyn Labella, CMA, am acting as scribe for Willeen Niece, MD .   Documentation: I have reviewed the above documentation for accuracy and completeness, and I agree with the above.  Willeen Niece, MD

## 2022-07-31 NOTE — Patient Instructions (Addendum)
Continue triamcinolone 0.1% cream once to twice daily as needed for itchy flares. Avoid applying to face, groin, and axilla. Use as directed. Long-term use can cause thinning of the skin.  Continue ketoconazole 2% shampoo - massage into scalp and let sit several minutes before rinsing.   Gentle Skin Care Guide  1. Bathe no more than once a day.  2. Avoid bathing in hot water  3. Use a mild soap like Dove, Vanicream, Cetaphil, CeraVe. Can use Lever 2000 or Cetaphil antibacterial soap  4. Use soap only where you need it. On most days, use it under your arms, between your legs, and on your feet. Let the water rinse other areas unless visibly dirty.  5. When you get out of the bath/shower, use a towel to gently blot your skin dry, don't rub it.  6. While your skin is still a little damp, apply a moisturizing cream such as Vanicream, CeraVe, Cetaphil, Eucerin, Sarna lotion or plain Vaseline Jelly. For hands apply Neutrogena [place] Hand Cream or Excipial Hand Cream.  7. Reapply moisturizer any time you start to itch or feel dry.  8. Sometimes using free and clear laundry detergents can be helpful. Fabric softener sheets should be avoided. Downy Free & Gentle liquid, or any liquid fabric softener that is free of dyes and perfumes, it acceptable to use  9. If your doctor has given you prescription creams you may apply moisturizers over them     Due to recent changes in healthcare laws, you may see results of your pathology and/or laboratory studies on MyChart before the doctors have had a chance to review them. We understand that in some cases there may be results that are confusing or concerning to you. Please understand that not all results are received at the same time and often the doctors may need to interpret multiple results in order to provide you with the best plan of care or course of treatment. Therefore, we ask that you please give [place] 2 business days to thoroughly review all your  results before contacting the office for clarification. Should we see a critical lab result, you will be contacted sooner.   If You Need Anything After Your Visit  If you have any questions or concerns for your doctor, please call our main line at [phone] and press option 4 to reach your doctor's medical assistant. If no one answers, please leave a voicemail as directed and we will return your call as soon as possible. Messages left after 4 pm will be answered the following business day.   You may also send [place] a message via MyChart. We typically respond to MyChart messages within 1-2 business days.  For prescription refills, please ask your pharmacy to contact our office. Our fax number is [phone].  If you have an urgent issue when the clinic is closed that cannot wait until the next business day, you can page your doctor at the number below.    Please note that while we do our best to be available for urgent issues outside of office hours, we are not available 24/7.   If you have an urgent issue and are unable to reach [place], you may choose to seek medical care at your doctor's office, retail clinic, urgent care center, or emergency room.  If you have a medical emergency, please immediately call 911 or go to the emergency department.  Pager Numbers  - Dr. Gwen Pounds: [phone]  - Dr. Neale Burly: [phone]  - Dr. Roseanne Reno: [phone]  In the event of inclement weather, please call our main line at [phone] for an update on the status of any delays or closures.  Dermatology Medication Tips: Please keep the boxes that topical medications come in in order to help keep track of the instructions about where and how to use these. Pharmacies typically print the medication instructions only on the boxes and not directly on the medication tubes.   If your medication is too expensive, please contact our office at [phone] option 4 or send [place] a message through [place].   We are  unable to tell what your co-pay for medications will be in advance as this is different depending on your insurance coverage. However, we may be able to find a substitute medication at lower cost or fill out paperwork to get insurance to cover a needed medication.   If a prior authorization is required to get your medication covered by your insurance company, please allow [place] 1-2 business days to complete this process.  Drug prices often vary depending on where the prescription is filled and some pharmacies may offer cheaper prices.  The website www.goodrx.com contains coupons for medications through different pharmacies. The prices here do not account for what the cost may be with help from insurance (it may be cheaper with your insurance), but the website can give you the price if you did not use any insurance.  - You can print the associated coupon and take it with your prescription to the pharmacy.  - You may also stop by our office during regular business hours and pick up a GoodRx coupon card.  - If you need your prescription sent electronically to a different pharmacy, notify our office through [facility] or by phone at [phone] option 4.     Si Usted Necesita Algo Despus de Su Visita  Tambin puede enviarnos un mensaje a travs de Pharmacist, community. Por lo general respondemos a los mensajes de MyChart en el transcurso de 1 a 2 das hbiles.  Para renovar recetas, por favor pida a su farmacia que se ponga en contacto con nuestra oficina. Harland Dingwall de fax es [place] [phone].  Si tiene un asunto urgente cuando la clnica est cerrada y que no puede esperar hasta el siguiente da hbil, puede llamar/localizar a su doctor(a) al nmero que aparece a continuacin.   Por favor, tenga en cuenta que aunque hacemos todo lo posible para estar disponibles para asuntos urgentes fuera del horario de [place], no estamos disponibles las 24 horas del da, los 7 das de la [place].   Si tiene un  problema urgente y no puede comunicarse con nosotros, puede optar por buscar atencin mdica  en el consultorio de su doctor(a), en una clnica privada, en un centro de atencin urgente o en una sala de emergencias.  Si tiene Engineering geologist, por favor llame inmediatamente al 911 o vaya a la sala de emergencias.  Nmeros de bper  - Dr. Nehemiah Massed: [phone]  - Dra. Moye: [phone]  - Dra. Nicole Kindred: [phone]  En caso de inclemencias del [place], por favor llame a Johnsie Kindred principal al [phone] para una actualizacin sobre el [place] de cualquier retraso o cierre.  Consejos para la medicacin en dermatologa: Por favor, guarde las cajas en las que vienen los medicamentos de uso tpico para ayudarle a seguir las instrucciones sobre dnde y cmo usarlos. Las farmacias generalmente imprimen las instrucciones del medicamento slo en las cajas y no directamente en los tubos del [place].  Si su medicamento es muy caro, por favor, pngase en contacto con Zigmund Daniel llamando al [phone] y presione la opcin 4 o envenos un mensaje a travs de Pharmacist, community.   No podemos decirle cul ser su copago por los medicamentos por adelantado ya que esto es diferente dependiendo de la cobertura de su seguro. Sin embargo, es posible que podamos encontrar un medicamento sustituto a Electrical engineer un formulario para que el seguro cubra el medicamento que se considera necesario.   Si se requiere una autorizacin previa para que su compaa de seguros [place] su medicamento, por favor permtanos de 1 a 2 das hbiles para completar este proceso.  Los precios de los medicamentos varan con frecuencia dependiendo del Environmental consultant de dnde se surte la receta y alguna farmacias pueden ofrecer precios ms baratos.  El sitio web www.goodrx.com tiene cupones para medicamentos de Airline pilot. Los precios aqu no tienen en cuenta lo que podra costar con la ayuda del seguro (puede ser ms  barato con su seguro), pero el sitio web puede darle el precio si no utiliz Research scientist (physical sciences).  - Puede imprimir el cupn correspondiente y llevarlo con su receta a la farmacia.  - Tambin puede pasar por nuestra oficina durante el horario de atencin regular y Charity fundraiser una tarjeta de cupones de GoodRx.  - Si necesita que su receta se enve electrnicamente a una farmacia diferente, informe a nuestra oficina a travs de MyChart de [place] o por telfono llamando al [phone] y presione la opcin 4.

## 2022-08-04 ENCOUNTER — Ambulatory Visit
Admission: RE | Admit: 2022-08-04 | Discharge: 2022-08-04 | Disposition: A | Discharge status: Home / Self Care | Payer: Medicare Other | Source: Ambulatory Visit | Attending: Nephrology

## 2022-08-04 DIAGNOSIS — N281 Cyst of kidney, acquired: Secondary | ICD-10-CM | POA: Diagnosis not present

## 2022-08-04 DIAGNOSIS — N1832 Chronic kidney disease, stage 3b: Secondary | ICD-10-CM | POA: Insufficient documentation

## 2022-08-04 DIAGNOSIS — N189 Chronic kidney disease, unspecified: Secondary | ICD-10-CM | POA: Diagnosis not present

## 2022-08-04 DIAGNOSIS — R8290 Unspecified abnormal findings in urine: Secondary | ICD-10-CM | POA: Insufficient documentation

## 2022-09-06 DIAGNOSIS — K219 Gastro-esophageal reflux disease without esophagitis: Secondary | ICD-10-CM | POA: Diagnosis not present

## 2022-09-06 DIAGNOSIS — I10 Essential (primary) hypertension: Secondary | ICD-10-CM | POA: Diagnosis not present

## 2022-09-06 DIAGNOSIS — R8290 Unspecified abnormal findings in urine: Secondary | ICD-10-CM | POA: Diagnosis not present

## 2022-09-06 DIAGNOSIS — N1832 Chronic kidney disease, stage 3b: Secondary | ICD-10-CM | POA: Diagnosis not present

## 2022-09-06 DIAGNOSIS — E872 Acidosis, unspecified: Secondary | ICD-10-CM | POA: Diagnosis not present

## 2022-09-06 DIAGNOSIS — F411 Generalized anxiety disorder: Secondary | ICD-10-CM | POA: Diagnosis not present

## 2022-09-11 ENCOUNTER — Ambulatory Visit: Payer: Self-pay | Admitting: Licensed Clinical Social Worker

## 2022-09-11 NOTE — Patient Outreach (Signed)
Care Coordination   Follow Up Visit Note   09/11/2022 Name: Kathy Pacheco MRN: [id number] DOB: 01-Nov-1931  Kathy Pacheco is a 89 y.o. year old female who sees [facility] for primary care. I spoke with  Kathy Pacheco / Kathy Pacheco, daughter of client, via phone today about client needs.  What matters to the patients health and wellness today? patient has anxiety issues in some social settings. She is not wanting to appear as if she needs help with daily activities.    Goals Addressed             This Visit's Progress    patient has anxiety issues in some social settings. She is not wanting to appear as if she needs help with daily activities.       Interventions:  Spoke with Kathy Pacheco, daughter of client, via phone today about client needs Discussed anxiety and stress issue sof client. Kathy Pacheco said client becomes anxious or stressed when talking about the past and past family situations. Kathy Pacheco said client will sometimes pick at skin on client head or neck.  Kathy Pacheco said family has to monitor client skin issues because of client picking at certain areas on her neck or head Spoke with Kathy Pacheco  about meals provision for client. Client receives Meals on Wheels support 5 days weekly Discussed in home assistance . Client receives help from home health aide on Mondays (9:00 AM to 12:OO)  And on Fridays (9:00 AM to 12:00 ).  She said that help from home health aide is very useful to her LCSW also talked via phone today with Kathy Pacheco, daughter of client about ADLs completion for client Kathy Pacheco said client was bathing more now and Kathy Pacheco feels that home health aide is helping client when aide is with client Discussed sleeping of client. Discussed walking of client. Client likes to take short walks for exercise Kathy Pacheco said client is anxious occasionally .Kathy Pacheco said client grew up poor and Kathy Pacheco feels that this is affecting client's view of things now . Client does not want to waste  resources Discussed client use of Zoloft.  Kathy Pacheco said client had been using prescribed Zoloft since February of 2024. Kathy Pacheco feels that Zoloft has been helping client Reviewed vision needs of client Discussed fact that brother of client passed away a few months ago; this has been difficult on client. She is experiencing likely a number of grief issues. Kathy Pacheco said client gets anxious or sad when talking about the past and the way client grew up Kathy Pacheco said client has had no recent falls. Discussed with Kathy Pacheco client use of busy apron or tactile stimuli to help client use touch and feel sensory experiences to try to reduce anxiety (for example rubbing a smooth item, rubbing fur material ) Kathy Pacheco for phone call with LCSW Encouraged client or Kathy Pacheco to call LCSW as needed for SW support for client at [phone].           SDOH assessments and interventions completed:  Yes  SDOH Interventions Today    Flowsheet Row Most Recent Value  SDOH Interventions   Depression Interventions/Treatment  Medication, Currently on Treatment  Physical Activity Interventions Other (Comments)  [may have occasional pain issues. may have occasional mobility issues]  Stress Interventions Provide Counseling  [has stress and anxiety issues. Is having some grief issues. daughter, Kathy Pacheco , said client sometimes picks at skin on her head or neck]        Care  Coordination Interventions:  Yes, provided    Interventions Today    Flowsheet Row Most Recent Value  Chronic Disease   Chronic disease during today's visit Other  [spoke with Kathy Pacheco, daughter of client, about client needs]  General Interventions   General Interventions Discussed/Reviewed General Interventions Discussed, Community Resources  [reviwed program support]  Exercise Interventions   Exercise Discussed/Reviewed Physical Activity  [discussed client ambulation]  Physical Activity Discussed/Reviewed Physical Activity Discussed   Education Interventions   Education Provided Provided Education  Provided Verbal Education On Nutrition  Mental Health Interventions   Mental Health Discussed/Reviewed Coping Strategies, Anxiety  [client has anxiety and stress issues]  Pharmacy Interventions   Pharmacy Dicussed/Reviewed Pharmacy Topics Discussed  Safety Interventions   Safety Discussed/Reviewed Fall Risk       Follow up plan: Follow up call scheduled for 10/31/22 at 9:30 AM     Encounter Outcome:  Pt. Visit Completed   Kelton Pillar. MSW, LCSW Licensed Visual merchandiser [facility] [phone]

## 2022-09-11 NOTE — Patient Instructions (Signed)
Visit Information  Thank you for taking time to visit with me today. Please don't hesitate to contact me if I can be of assistance to you.   Following are the goals we discussed today:   Goals Addressed             This Visit's Progress    patient has anxiety issues in some social settings. She is not wanting to appear as if she needs help with daily activities.       Interventions:  Spoke with [name], daughter of client, via phone today about client needs Discussed anxiety and stress issue sof client. [name] said client becomes anxious or stressed when talking about the past and past family situations. [name] said client will sometimes pick at skin on client head or neck.  [name] said family has to monitor client skin issues because of client picking at certain areas on her neck or head Spoke with [name]  about meals provision for client. Client receives Meals on Wheels support 5 days weekly Discussed in home assistance . Client receives help from home health aide on Mondays (9:00 AM to 12:OO)  And on Fridays (9:00 AM to 12:00 ).  She said that help from home health aide is very useful to her LCSW also talked via phone today with [name], daughter of client about ADLs completion for client [name] said client was bathing more now and [name] feels that home health aide is helping client when aide is with client Discussed sleeping of client. Discussed walking of client. Client likes to take short walks for exercise [name] said client is anxious occasionally .[name] said client grew up poor and [name] feels that this is affecting client's view of things now . Client does not want to waste resources Discussed client use of Zoloft.  [name] said client had been using prescribed Zoloft since February of 2024. [name] feels that Zoloft has been helping client Reviewed vision needs of client Discussed fact that brother of client passed away a few months ago; this has been difficult on client. She is  experiencing likely a number of grief issues. [name] said client gets anxious or sad when talking about the past and the way client grew up [name] said client has had no recent falls. Discussed with [name] client use of busy apron or tactile stimuli to help client use touch and feel sensory experiences to try to reduce anxiety (for example rubbing a smooth item, rubbing fur material ) Dava Najjar for phone call with LCSW Encouraged client or [name] to call LCSW as needed for SW support for client at [phone].           Our next appointment is by telephone on 10/31/22 at 9:30 AM   Please call the care guide team at [phone] if you need to cancel or reschedule your appointment.   If you are experiencing a Mental Health or Behavioral Health Crisis or need someone to talk to, please go to [facility] [address], [place] [phone])   The patient/ [name], daughter,  verbalized understanding of instructions, educational materials, and care plan provided today and DECLINED offer to receive copy of patient instructions, educational materials, and care plan.   The patient / [name], daughter, has been provided with contact information for the care management team and has been advised to call with any health related questions or concerns.   Kelton Pillar. MSW, LCSW Licensed Visual merchandiser [facility] Care Management [phone]

## 2022-10-02 ENCOUNTER — Other Ambulatory Visit: Payer: Self-pay | Admitting: Physician Assistant

## 2022-10-02 DIAGNOSIS — F320 Major depressive disorder, single episode, mild: Secondary | ICD-10-CM

## 2022-10-23 DIAGNOSIS — K2100 Gastro-esophageal reflux disease with esophagitis, without bleeding: Secondary | ICD-10-CM | POA: Diagnosis not present

## 2022-10-23 DIAGNOSIS — R110 Nausea: Secondary | ICD-10-CM | POA: Diagnosis not present

## 2022-10-31 ENCOUNTER — Ambulatory Visit: Payer: Self-pay | Admitting: Licensed Clinical Social Worker

## 2022-10-31 NOTE — Patient Outreach (Signed)
Care Coordination   Follow Up Visit Note   10/31/2022 Name: Kathy Pacheco MRN: [id number] DOB: [birth year]  Kathy Pacheco is a 89 y.o. year old female who sees [facility] for primary care. I spoke with  Kathy Pacheco by phone today.  What matters to the patients health and wellness today?  patient has anxiety issues in some social settings. She is not wanting to appear as if she needs help with daily activities.    Goals Addressed             This Visit's Progress    patient has anxiety issues in some social settings. She is not wanting to appear as if she needs help with daily activities.       Interventions:  Spoke with  client via phone about client needs Discussed medication procurement of client Discussed in home help for client with home health aide.  Home Health Aide  assists client in the home on Mondays and Fridays as scheduled Spoke with client about meals provision for client. Client receives Meals on Wheels support 5 days weekly Discussed sleeping of client.  Talked with client about support of her daughter, [name] Encouraged client or [name] to call LCSW as needed for SW support for client at [phone].           SDOH assessments and interventions completed:  Yes  SDOH Interventions Today    Flowsheet Row Most Recent Value  SDOH Interventions   Depression Interventions/Treatment  Medication, Currently on Treatment  Physical Activity Interventions Other (Comments)  [occasional mobility challenges]  Stress Interventions Other (Comment)  [stress in managing medical needs]        Care Coordination Interventions:  Yes, provided  Interventions Today    Flowsheet Row Most Recent Value  Chronic Disease   Chronic disease during today's visit Other  [spoke with client about client needs]  General Interventions   General Interventions Discussed/Reviewed General Interventions Discussed, Community Resources  Education Interventions    Education Provided Provided Education  Provided Engineer, petroleum On [employer]  Mental Health Interventions   Mental Health Discussed/Reviewed Coping Strategies  [no mood issues noted]  Nutrition Interventions   Nutrition Discussed/Reviewed Nutrition Discussed  Pharmacy Interventions   Pharmacy Dicussed/Reviewed Pharmacy Topics Discussed       Follow up plan: Follow up call scheduled for 12/19/22 at 2:00 PM     Encounter Outcome:  Patient Visit Completed   Kelton Pillar.Porsche Noguchi MSW, LCSW Licensed Visual merchandiser [facility] Care Management [phone]

## 2022-10-31 NOTE — Patient Instructions (Signed)
Visit Information  Thank you for taking time to visit with me today. Please don't hesitate to contact me if I can be of assistance to you.   Following are the goals we discussed today:   Goals Addressed             This Visit's Progress    patient has anxiety issues in some social settings. She is not wanting to appear as if she needs help with daily activities.       Interventions:  Spoke with  client via phone about client needs Discussed medication procurement of client Discussed in home help for client with home health aide.  Home Health Aide  assists client in the home on Mondays and Fridays as scheduled Spoke with client about meals provision for client. Client receives Meals on Wheels support 5 days weekly Discussed sleeping of client.  Talked with client about support of her daughter, [name] Encouraged client or [name] to call LCSW as needed for SW support for client at [phone].           Our next appointment is by telephone on 12/19/22 at 2:00 PM   Please call the care guide team at [phone] if you need to cancel or reschedule your appointment.   If you are experiencing a Mental Health or Behavioral Health Crisis or need someone to talk to, please go to [facility] [address] [place] [phone])   The patient verbalized understanding of instructions, educational materials, and care plan provided today and DECLINED offer to receive copy of patient instructions, educational materials, and care plan.   The patient has been provided with contact information for the care management team and has been advised to call with any health related questions or concerns.   Kelton Pillar.Bisma Klett MSW, LCSW Licensed Visual merchandiser [facility] Care Management [phone]

## 2022-11-02 ENCOUNTER — Encounter: Admitting: Dermatology

## 2022-11-14 ENCOUNTER — Ambulatory Visit: Payer: Medicare Other | Admitting: Family Medicine

## 2022-11-15 DIAGNOSIS — F411 Generalized anxiety disorder: Secondary | ICD-10-CM | POA: Diagnosis not present

## 2022-11-15 DIAGNOSIS — K219 Gastro-esophageal reflux disease without esophagitis: Secondary | ICD-10-CM | POA: Diagnosis not present

## 2022-11-15 DIAGNOSIS — I10 Essential (primary) hypertension: Secondary | ICD-10-CM | POA: Diagnosis not present

## 2022-11-15 DIAGNOSIS — N281 Cyst of kidney, acquired: Secondary | ICD-10-CM | POA: Diagnosis not present

## 2022-11-15 DIAGNOSIS — E872 Acidosis, unspecified: Secondary | ICD-10-CM | POA: Diagnosis not present

## 2022-11-15 DIAGNOSIS — R8290 Unspecified abnormal findings in urine: Secondary | ICD-10-CM | POA: Diagnosis not present

## 2022-11-15 DIAGNOSIS — N1832 Chronic kidney disease, stage 3b: Secondary | ICD-10-CM | POA: Diagnosis not present

## 2022-11-16 ENCOUNTER — Encounter: Admitting: Dermatology

## 2022-11-21 ENCOUNTER — Ambulatory Visit (INDEPENDENT_AMBULATORY_CARE_PROVIDER_SITE_OTHER): Payer: Medicare Other | Admitting: Family Medicine

## 2022-11-21 ENCOUNTER — Encounter: Payer: Self-pay | Admitting: Family Medicine

## 2022-11-21 ENCOUNTER — Other Ambulatory Visit: Payer: Self-pay | Admitting: Family Medicine

## 2022-11-21 VITALS — BP 142/73 | HR 66 | Temp 97.7°F | Resp 16 | Ht 62.0 in | Wt 115.9 lb

## 2022-11-21 DIAGNOSIS — I10 Essential (primary) hypertension: Secondary | ICD-10-CM

## 2022-11-21 DIAGNOSIS — J449 Chronic obstructive pulmonary disease, unspecified: Secondary | ICD-10-CM | POA: Diagnosis not present

## 2022-11-21 DIAGNOSIS — F339 Major depressive disorder, recurrent, unspecified: Secondary | ICD-10-CM

## 2022-11-21 DIAGNOSIS — Z23 Encounter for immunization: Secondary | ICD-10-CM | POA: Diagnosis not present

## 2022-11-21 DIAGNOSIS — G3184 Mild cognitive impairment, so stated: Secondary | ICD-10-CM

## 2022-11-21 DIAGNOSIS — M810 Age-related osteoporosis without current pathological fracture: Secondary | ICD-10-CM

## 2022-11-21 NOTE — Assessment & Plan Note (Addendum)
Patient's blood pressure mildly elevated today.  However, patient is at risk for falls and pressure was improved on recheck.  Will not increase her medication today due to advanced age age with osteoporosis and high fall risk. Continue amlodipine 5 mg daily.

## 2022-11-21 NOTE — Assessment & Plan Note (Signed)
Patient declined repeat DEXA scan today.  Will offer again next visit.

## 2022-11-21 NOTE — Progress Notes (Signed)
Established patient visit   Patient: Kathy Pacheco   DOB: 06/06/1931   89 y.o. Female  MRN: [id number] Visit Date: 11/21/2022  Today's healthcare provider: Sherlyn Hay, DO   Chief Complaint  Patient presents with   Follow-up    6 mo f/u    Subjective    HPI Last mAWV 02/27/2022  Flu shot? Yes DEXA scan? No And Meals on Wheels for lunch and eats a good breakfast For supper, she has a sandwich or whatever she wants  Walks up and down the driveway daily, at a minimum to get her mail Consistently states she is 33 Yo (she is 11, as of last month)  Neighbors sometimes help her. Her daughter and son-in-law also help her and bring her to her appointments.    Medications: Outpatient Medications Prior to Visit  Medication Sig   acetaminophen (TYLENOL) 500 MG tablet Take 500 mg by mouth every 6 (six) hours as needed for mild pain.   albuterol (VENTOLIN HFA) 108 (90 Base) MCG/ACT inhaler Inhale 2 puffs into the lungs every 6 (six) hours as needed for wheezing or shortness of breath.   amLODipine (NORVASC) 5 MG tablet Take 1 tablet (5 mg total) by mouth daily.   aspirin EC 81 MG tablet Take 81 mg by mouth daily. Swallow whole.   famotidine (PEPCID) 40 MG tablet Take 40 mg by mouth at bedtime.   ketoconazole (NIZORAL) 2 % shampoo 2 to 3 times per week lather on scalp, leave on 5-8 minutes, rinse out.   pantoprazole (PROTONIX) 40 MG tablet 40 mg 2 (two) times daily. 30 minutes before meals   rosuvastatin (CRESTOR) 10 MG tablet TAKE 1 TABLET BY MOUTH EVERY DAY   sertraline (ZOLOFT) 25 MG tablet TAKE 1 TABLET (25 MG TOTAL) BY MOUTH DAILY.   triamcinolone cream (KENALOG) 0.1 % twice daily to affected areas on body 2 weeks on 2 weeks off as needed for itching. Avoid applying to face, groin, and axilla.   [DISCONTINUED] ondansetron (ZOFRAN ODT) 4 MG disintegrating tablet Allow 1-2 tablets to dissolve in your mouth every 8 hours as needed for nausea/vomiting   No  facility-administered medications prior to visit.    Review of Systems  Constitutional:  Negative for appetite change, chills, fatigue and fever.  Respiratory:  Negative for chest tightness and shortness of breath.   Cardiovascular:  Negative for chest pain and palpitations.  Gastrointestinal:  Negative for abdominal pain, nausea and vomiting.  Neurological:  Negative for dizziness and weakness.        Objective    BP (!) 142/73 (BP Location: Right Arm, Patient Position: Sitting, Cuff Size: Normal)   Pulse 66   Temp 97.7 F (36.5 C)   Resp 16   Ht 5\' 2"  (1.575 m)   Wt 115 lb 14.4 oz (52.6 kg)   BMI 21.20 kg/m     Physical Exam Constitutional:      Appearance: Normal appearance.  HENT:     Head: Normocephalic and atraumatic.  Eyes:     General: No scleral icterus.    Extraocular Movements: Extraocular movements intact.     Conjunctiva/sclera: Conjunctivae normal.  Cardiovascular:     Rate and Rhythm: Normal rate and regular rhythm.     Pulses: Normal pulses.     Heart sounds: Murmur heard.  Pulmonary:     Effort: Pulmonary effort is normal. No respiratory distress.     Breath sounds: Normal breath sounds.  Musculoskeletal:  Right lower leg: No edema.     Left lower leg: No edema.  Skin:    General: Skin is warm and dry.  Neurological:     Mental Status: She is alert and oriented to person, place, and time. Mental status is at baseline.  Psychiatric:        Mood and Affect: Mood normal.        Behavior: Behavior normal.      No results found for any visits on 11/21/22.  Assessment & Plan    COPD, mild (HCC) Assessment & Plan: Patient denies symptoms today.   No acute concerns.  Continue to monitor. Continue albuterol inhaler as needed   Essential hypertension Assessment & Plan: Patient's blood pressure mildly elevated today.  However, patient is at risk for falls and pressure was improved on recheck.  Will not increase her medication today due to  advanced age age with osteoporosis and high fall risk. Continue amlodipine 5 mg daily.   Depression, recurrent (HCC) Assessment & Plan: Stable. Continue sertraline 25 mg daily   MCI (mild cognitive impairment) Assessment & Plan: Mild cognitive impairment, with repetitive statements, apparent on exam and during conversation during the visit.  Patient does have a medical alert device.   Osteoporosis, unspecified osteoporosis type, unspecified pathological fracture presence Assessment & Plan: Patient declined repeat DEXA scan today.  Will offer again next visit.   Encounter for immunization -     Flu Vaccine Trivalent High Dose (Fluad)   Return in about 6 months (around 05/22/2023) for CPE.      I discussed the assessment and treatment plan with the patient  The patient was provided an opportunity to ask questions and all were answered. The patient agreed with the plan and demonstrated an understanding of the instructions.   The patient was advised to call back or seek an in-person evaluation if the symptoms worsen or if the condition fails to improve as anticipated.    Sherlyn Hay, DO  [facility] [phone] (phone) [phone] (fax)  [facility]

## 2022-11-21 NOTE — Assessment & Plan Note (Signed)
Mild cognitive impairment, with repetitive statements, apparent on exam and during conversation during the visit.  Patient does have a medical alert device.

## 2022-11-21 NOTE — Patient Instructions (Signed)
PLEASE BRING ALL OF YOUR MEDICATION BOTTLES TO EVERY APPOINTMENT TO MAKE SURE OUR MEDICATION LIST IS THE SAME AS YOURS

## 2022-11-21 NOTE — Assessment & Plan Note (Signed)
Patient denies symptoms today.   No acute concerns.  Continue to monitor. Continue albuterol inhaler as needed

## 2022-11-21 NOTE — Assessment & Plan Note (Addendum)
Stable. Continue sertraline 25mg  daily

## 2022-11-22 NOTE — Telephone Encounter (Signed)
Requested Prescriptions  Pending Prescriptions Disp Refills   rosuvastatin (CRESTOR) 10 MG tablet [Pharmacy Med Name: ROSUVASTATIN CALCIUM 10 MG TAB] 90 tablet 0    Sig: TAKE 1 TABLET BY MOUTH EVERY DAY     Cardiovascular:  Antilipid - Statins 2 Failed - 11/21/2022  6:38 PM      Failed - Cr in normal range and within 360 days    Creatinine  Date Value Ref Range Status  07/01/2012 0.97 0.60 - 1.30 mg/dL Final   Creatinine, Ser  Date Value Ref Range Status  05/31/2022 1.47 (H) 0.57 - 1.00 mg/dL Final         Failed - Lipid Panel in normal range within the last 12 months    Cholesterol, Total  Date Value Ref Range Status  11/16/2021 165 100 - 199 mg/dL Final   LDL Chol Calc (NIH)  Date Value Ref Range Status  11/16/2021 92 0 - 99 mg/dL Final   HDL  Date Value Ref Range Status  11/16/2021 51 >39 mg/dL Final   Triglycerides  Date Value Ref Range Status  11/16/2021 121 0 - 149 mg/dL Final         Passed - Patient is not pregnant      Passed - Valid encounter within last 12 months    Recent Outpatient Visits           Yesterday COPD, mild (HCC)   [facility] [place], Monico Blitz, DO   6 months ago Essential hypertension   [facility] Alfredia Ferguson, PA-C   7 months ago Depression, major, single episode, mild [facility])   [place] Alfredia Ferguson, PA-C   8 months ago Depression, major, single episode, mild [facility])   [facility] Alfredia Ferguson, PA-C   9 months ago Weakness   [facility] Alfredia Ferguson, PA-C       Future Appointments             In 6 months Pard[place], Monico Blitz, DO [facility]

## 2022-12-04 ENCOUNTER — Ambulatory Visit: Payer: Medicare Other | Admitting: Dermatology

## 2022-12-04 DIAGNOSIS — Z7189 Other specified counseling: Secondary | ICD-10-CM

## 2022-12-04 DIAGNOSIS — D1801 Hemangioma of skin and subcutaneous tissue: Secondary | ICD-10-CM | POA: Diagnosis not present

## 2022-12-04 DIAGNOSIS — Z1283 Encounter for screening for malignant neoplasm of skin: Secondary | ICD-10-CM | POA: Diagnosis not present

## 2022-12-04 DIAGNOSIS — W908XXA Exposure to other nonionizing radiation, initial encounter: Secondary | ICD-10-CM

## 2022-12-04 DIAGNOSIS — L578 Other skin changes due to chronic exposure to nonionizing radiation: Secondary | ICD-10-CM

## 2022-12-04 DIAGNOSIS — L821 Other seborrheic keratosis: Secondary | ICD-10-CM

## 2022-12-04 DIAGNOSIS — L281 Prurigo nodularis: Secondary | ICD-10-CM | POA: Diagnosis not present

## 2022-12-04 DIAGNOSIS — L814 Other melanin hyperpigmentation: Secondary | ICD-10-CM

## 2022-12-04 DIAGNOSIS — Z79899 Other long term (current) drug therapy: Secondary | ICD-10-CM

## 2022-12-04 DIAGNOSIS — D229 Melanocytic nevi, unspecified: Secondary | ICD-10-CM

## 2022-12-04 DIAGNOSIS — L820 Inflamed seborrheic keratosis: Secondary | ICD-10-CM

## 2022-12-04 MED ORDER — MOMETASONE FUROATE 0.1 % EX CREA: TOPICAL_CREAM | CUTANEOUS | 3 refills | Status: AC

## 2022-12-04 NOTE — Progress Notes (Signed)
Follow-Up Visit   Subjective  Kathy Pacheco is a 89 y.o. female who presents for the following: Skin Cancer Screening and Full Body Skin Exam  The patient presents for Total-Body Skin Exam (TBSE) for skin cancer screening and mole check. The patient has spots, moles and lesions to be evaluated, some may be new or changing and the patient may have concern these could be cancer.    The following portions of the chart were reviewed this encounter and updated as appropriate: medications, allergies, medical history  Review of Systems:  No other skin or systemic complaints except as noted in HPI or Assessment and Plan.  Objective  Well appearing patient in no apparent distress; mood and affect are within normal limits.  A full examination was performed including scalp, head, eyes, ears, nose, lips, neck, chest, axillae, abdomen, back, buttocks, bilateral upper extremities, bilateral lower extremities, hands, feet, fingers, toes, fingernails, and toenails. All findings within normal limits unless otherwise noted below.   Relevant physical exam findings are noted in the Assessment and Plan.  R temple x 1, L post shoulder x 1, L post neck x 1, R chest x 1 (4) Erythematous stuck-on, waxy papule or plaque    Assessment & Plan   SKIN CANCER SCREENING PERFORMED TODAY.  ACTINIC DAMAGE - Chronic condition, secondary to cumulative UV/sun exposure - diffuse scaly erythematous macules with underlying dyspigmentation - Recommend daily broad spectrum sunscreen SPF 30+ to sun-exposed areas, reapply every 2 hours as needed.  - Staying in the shade or wearing long sleeves, sun glasses (UVA+UVB protection) and wide brim hats (4-inch brim around the entire circumference of the hat) are also recommended for sun protection.  - Call for new or changing lesions.  LENTIGINES, SEBORRHEIC KERATOSES, HEMANGIOMAS - Benign normal skin lesions - Benign-appearing - Call for any changes  MELANOCYTIC NEVI -  Tan-brown and/or pink-flesh-colored symmetric macules and papules - Benign appearing on exam today - Observation - Call clinic for new or changing moles - Recommend daily use of broad spectrum spf 30+ sunscreen to sun-exposed areas.   Inflamed seborrheic keratosis (4) R temple x 1, L post shoulder x 1, L post neck x 1, R chest x 1  With sebaceous hyperplasia on the R forehead, prurigo nodularis on the L post shoulder, and L post neck - Symptomatic, irritating, patient would like treated.   Destruction of lesion - R temple x 1, L post shoulder x 1, L post neck x 1, R chest x 1 (4) Complexity: simple   Destruction method: cryotherapy   Informed consent: discussed and consent obtained   Timeout:  patient name, date of birth, surgical site, and procedure verified Lesion destroyed using liquid nitrogen: Yes   Region frozen until ice ball extended beyond lesion: Yes   Outcome: patient tolerated procedure well with no complications   Post-procedure details: wound care instructions given      PRURIGO NODULARIS/LICHEN SIMPLEX CHRONICUS Exam: Confluent spotty hyperpigmentation on the arms, shoulders, and chest.  Chronic and persistent condition with duration or expected duration over one year. Condition is symptomatic/ bothersome to patient. Not currently at goal.  Lichen simplex chronicus [facility]) is a persistent itchy area of thickened skin that is induced by chronic rubbing and/or scratching (chronic dermatitis).  These areas may be pink, hyperpigmented and may have excoriations and bumps (prurigo nodules-PN).  PN/LSC is commonly observed in uncontrolled atopic dermatitis and other forms of eczema, and in other itchy skin conditions (eg, insect bites, scabies).  Sometimes it is not possible to know initial cause of PN/LSC if it has been present for a long time.  It generally responds well to treatment with high potency topical steroids.  It is important to stop rubbing/scratching the area in order  to break the itch-scratch-rash-itch cycle, in order for the rash to resolve.   Treatment Plan: Start Mometasone 0.1% cream QAM up to 5d/wk to aa's.   Avoid picking/rubbing/scratching   Return in about 1 year (around 12/04/2023) for TBSE.  Kathy Pacheco, CMA, am acting as scribe for [name], MD .   Documentation: I have reviewed the above documentation for accuracy and completeness, and I agree with the above.  [name], MD

## 2022-12-04 NOTE — Patient Instructions (Signed)

## 2022-12-06 ENCOUNTER — Ambulatory Visit: Payer: Self-pay | Admitting: Licensed Clinical Social Worker

## 2022-12-06 NOTE — Patient Instructions (Signed)
Visit Information  Thank you for taking time to visit with me today. Please don't hesitate to contact me if I can be of assistance to you.   Following are the goals we discussed today:   Goals Addressed             This Visit's Progress    patient has anxiety issues in some social settings. She is not wanting to appear as if she needs help with daily activities.       Interventions:  Spoke with  [name], daughter of client, about client needs [name] said client had appointment with dermatologist recently. [name] said client keeps picking at skin areas. Sometimes areas that she picks on her skin may bleed slightly. [name] and LCSW discussed that this client symptom had been going on for a while. [name] said client is taking medications as prescribed.  LCSW suggested to [name] that she might talk with PCP about client dosage of  anxiety medication to see if PCP thinks an increased dose of anxiety medication might be helpful to client. [name] said that client behavior of picking at her skin had been going on for at least one year. Medication for anxiety has remained the same for this past year Discussed medication procurement of client Client is attending scheduled medical appointments. Client has no transportation needs [place] for phone call with LCSW Encouraged client or [name] to call LCSW as needed for SW support for client at [phone].           Our next appointment is by telephone on 01/01/23 at 9:30 AM   Please call the care guide team at [phone] if you need to cancel or reschedule your appointment.   If you are experiencing a Mental Health or Behavioral Health Crisis or need someone to talk to, please go to [facility] [address] [place] [phone])   The patient / [name], daughter, verbalized understanding of instructions, educational materials, and care plan provided today and DECLINED offer to  receive copy of patient instructions, educational materials, and care plan.   The patient / [name], daughter,  has been provided with contact information for the care management team and has been advised to call with any health related questions or concerns.   Kelton Pillar.Eustolia Drennen MSW, LCSW Licensed Visual merchandiser [facility] Care Management [phone]

## 2022-12-06 NOTE — Patient Outreach (Signed)
Care Coordination   Follow Up Visit Note   12/06/2022 Name: Kathy Pacheco MRN: [id number] DOB: 24-Sep-1931  Kathy Pacheco is a 89 y.o. year old female who sees Pardue, Monico Blitz, DO for primary care. I spoke with  Kathy Pacheco / Kathy Pacheco, daughter of client via phone.  What matters to the patients health and wellness today?  patient has anxiety issues in some social settings. She is not wanting to appear as if she needs help with daily activities.   Goals Addressed             This Visit's Progress    patient has anxiety issues in some social settings. She is not wanting to appear as if she needs help with daily activities.       Interventions:  Spoke with  Kathy Pacheco, daughter of client, about client needs [name] said client had appointment with dermatologist recently. [name] said client keeps picking at skin areas. Sometimes areas that she picks on her skin may bleed slightly. [name] and LCSW discussed that this client symptom had been going on for a while. [name] said client is taking medications as prescribed.  LCSW suggested to [name] that she might talk with PCP about client dosage of  anxiety medication to see if PCP thinks an increased dose of anxiety medication might be helpful to client. [name] said that client behavior of picking at her skin had been going on for at least one year. Medication for anxiety has remained the same for this past year Discussed medication procurement of client Client is attending scheduled medical appointments. Client has no transportation needs [place] for phone call with LCSW Encouraged client or Kathy Pacheco to call LCSW as needed for SW support for client at [phone].           SDOH assessments and interventions completed:  Yes  SDOH Interventions Today    Flowsheet Row Most Recent Value  SDOH Interventions   Depression Interventions/Treatment  Medication, Currently on Treatment  Physical Activity Interventions  Other (Comments)  [occasional mobility challenges]  Stress Interventions Other (Comment)  [stress in managing medical needs]        Care Coordination Interventions:  Yes, provided  Interventions Today    Flowsheet Row Most Recent Value  Chronic Disease   Chronic disease during today's visit Other  [spoke with Kathy Pacheco, daughter, about client needs]  General Interventions   General Interventions Discussed/Reviewed [employer], General Interventions Discussed  Exercise Interventions   Exercise Discussed/Reviewed Physical Activity  Education Interventions   Education Provided Provided Education  Provided Verbal Education On Community Resources  Mental Health Interventions   Mental Health Discussed/Reviewed Coping Strategies  [client has anxiety issues]  Nutrition Interventions   Nutrition Discussed/Reviewed Nutrition Discussed  Pharmacy Interventions   Pharmacy Dicussed/Reviewed Pharmacy Topics Discussed  Safety Interventions   Safety Discussed/Reviewed Fall Risk        Follow up plan: Follow up call scheduled for 01/01/23 at 9:30 AM     Encounter Outcome:  Patient Visit Completed   Kelton Pillar.Jinan Biggins MSW, LCSW Licensed Visual merchandiser [facility] Care Management [phone]

## 2022-12-07 ENCOUNTER — Emergency Department: Payer: Medicare Other

## 2022-12-07 ENCOUNTER — Other Ambulatory Visit: Payer: Self-pay

## 2022-12-07 ENCOUNTER — Emergency Department
Admission: EM | Admit: 2022-12-07 | Discharge: 2022-12-07 | Disposition: A | Discharge status: Home / Self Care | Payer: Medicare Other | Attending: Emergency Medicine | Admitting: Emergency Medicine

## 2022-12-07 DIAGNOSIS — R410 Disorientation, unspecified: Secondary | ICD-10-CM | POA: Diagnosis not present

## 2022-12-07 DIAGNOSIS — R55 Syncope and collapse: Secondary | ICD-10-CM | POA: Diagnosis not present

## 2022-12-07 DIAGNOSIS — W19XXXA Unspecified fall, initial encounter: Secondary | ICD-10-CM | POA: Diagnosis not present

## 2022-12-07 DIAGNOSIS — R29818 Other symptoms and signs involving the nervous system: Secondary | ICD-10-CM | POA: Diagnosis not present

## 2022-12-07 DIAGNOSIS — N3000 Acute cystitis without hematuria: Secondary | ICD-10-CM | POA: Insufficient documentation

## 2022-12-07 DIAGNOSIS — R42 Dizziness and giddiness: Secondary | ICD-10-CM | POA: Diagnosis not present

## 2022-12-07 DIAGNOSIS — R0902 Hypoxemia: Secondary | ICD-10-CM | POA: Diagnosis not present

## 2022-12-07 LAB — CBC WITH DIFFERENTIAL/PLATELET
Abs Immature Granulocytes: 0.04 K/uL (ref 0.00–0.07)
Basophils Absolute: 0.1 K/uL (ref 0.0–0.1)
Basophils Relative: 1 %
Eosinophils Absolute: 0.5 K/uL (ref 0.0–0.5)
Eosinophils Relative: 5 %
HCT: 32 % — ABNORMAL LOW (ref 36.0–46.0)
Hemoglobin: 10.4 g/dL — ABNORMAL LOW (ref 12.0–15.0)
Immature Granulocytes: 0 %
Lymphocytes Relative: 14 %
Lymphs Abs: 1.4 K/uL (ref 0.7–4.0)
MCH: 27.7 pg (ref 26.0–34.0)
MCHC: 32.5 g/dL (ref 30.0–36.0)
MCV: 85.1 fL (ref 80.0–100.0)
Monocytes Absolute: 1.1 K/uL — ABNORMAL HIGH (ref 0.1–1.0)
Monocytes Relative: 11 %
Neutro Abs: 6.5 K/uL (ref 1.7–7.7)
Neutrophils Relative %: 69 %
Platelets: 290 K/uL (ref 150–400)
RBC: 3.76 MIL/uL — ABNORMAL LOW (ref 3.87–5.11)
RDW: 14.8 % (ref 11.5–15.5)
WBC: 9.5 K/uL (ref 4.0–10.5)
nRBC: 0 % (ref 0.0–0.2)

## 2022-12-07 LAB — URINALYSIS, ROUTINE W REFLEX MICROSCOPIC
Bilirubin Urine: NEGATIVE
Glucose, UA: NEGATIVE mg/dL
Hgb urine dipstick: NEGATIVE
Ketones, ur: NEGATIVE mg/dL
Nitrite: POSITIVE — AB
Protein, ur: NEGATIVE mg/dL
Specific Gravity, Urine: 1.02 (ref 1.005–1.030)
pH: 5 (ref 5.0–8.0)

## 2022-12-07 LAB — BASIC METABOLIC PANEL WITH GFR
Anion gap: 8 (ref 5–15)
BUN: 32 mg/dL — ABNORMAL HIGH (ref 8–23)
CO2: 25 mmol/L (ref 22–32)
Calcium: 9.2 mg/dL (ref 8.9–10.3)
Chloride: 104 mmol/L (ref 98–111)
Creatinine, Ser: 1.33 mg/dL — ABNORMAL HIGH (ref 0.44–1.00)
GFR, Estimated: 38 mL/min — ABNORMAL LOW
Glucose, Bld: 133 mg/dL — ABNORMAL HIGH (ref 70–99)
Potassium: 3.9 mmol/L (ref 3.5–5.1)
Sodium: 137 mmol/L (ref 135–145)

## 2022-12-07 LAB — TROPONIN I (HIGH SENSITIVITY): Troponin I (High Sensitivity): 5 ng/L (ref <18)

## 2022-12-07 MED ORDER — SODIUM CHLORIDE 0.9 % IV SOLN
1.0000 g | Freq: Once | INTRAVENOUS | Status: AC
  Administered 2022-12-07: 1 g via INTRAVENOUS
  Filled 2022-12-07: qty 10

## 2022-12-07 MED ORDER — CEPHALEXIN 500 MG PO CAPS: 500.0000 mg | ORAL_CAPSULE | Freq: Two times a day (BID) | ORAL | 0 refills | Status: AC

## 2022-12-07 NOTE — ED Notes (Signed)
Attempted to call pts Daughter to let her know she's here

## 2022-12-07 NOTE — ED Provider Notes (Signed)
[facility] Provider Note    Event Date/Time   First MD Initiated Contact with Patient 12/07/22 2043     (approximate)   History   Loss of Consciousness   HPI  Kathy Pacheco is a 89 y.o. female presents to the emergency department following an episode of syncope.  Patient was walking around trying to get some candy at the store when she had an episode of passing out.  States that she did not have any symptoms of chest pain or shortness of breath.  Did not hit her head or lose consciousness.  Does endorse some dysuria.  Denies any nausea, vomiting.  No pain.  No new medications.     Physical Exam   Triage Vital Signs: ED Triage Vitals [12/07/22 1411]  Encounter Vitals Group     BP (!) 132/59     Systolic BP Percentile      Diastolic BP Percentile      Pulse Rate 72     Resp 16     Temp (!) 97.5 F (36.4 C)     Temp Source Oral     SpO2 98 %     Weight      Height      Head Circumference      Peak Flow      Pain Score      Pain Loc      Pain Education      Exclude from Growth Chart     Most recent vital signs: Vitals:   12/07/22 1825 12/07/22 2146  BP: (!) 148/81 (!) 157/67  Pulse: 74 67  Resp: 16 16  Temp: 97.8 F (36.6 C) 97.6 F (36.4 C)  SpO2: 100% 98%    Physical Exam Constitutional:      Appearance: She is well-developed.  HENT:     Head: Atraumatic.  Eyes:     Conjunctiva/sclera: Conjunctivae normal.  Cardiovascular:     Rate and Rhythm: Regular rhythm.     Heart sounds: No murmur heard. Pulmonary:     Effort: No respiratory distress.  Abdominal:     General: There is no distension.     Tenderness: There is no abdominal tenderness. There is no right CVA tenderness or left CVA tenderness.  Musculoskeletal:        General: Normal range of motion.     Cervical back: Normal range of motion. No tenderness.     Comments: No midline thoracic or lumbar tenderness to palpation  Skin:    General: Skin is warm.   Neurological:     Mental Status: She is alert. Mental status is at baseline.  Psychiatric:        Mood and Affect: Mood normal.     IMPRESSION / MDM / ASSESSMENT AND PLAN / ED COURSE  I reviewed the triage vital signs and the nursing notes.  Differential diagnosis including dysrhythmia, ACS, electrolyte abnormality, dehydration, urinary tract infection   EKG  I, Corena Herter, the attending physician, personally viewed and interpreted this ECG.   Rate: Normal  Rhythm: Normal sinus  Axis: Normal  Intervals: Normal  ST&T Change: None    RADIOLOGY I independently reviewed imaging, my interpretation of imaging: CT scan of the head with no acute fracture or dislocation  LABS (all labs ordered are listed, but only abnormal results are displayed) Labs interpreted as -    Labs Reviewed  CBC WITH DIFFERENTIAL/PLATELET - Abnormal; Notable for the following components:  Result Value   RBC 3.76 (*)    Hemoglobin 10.4 (*)    HCT 32.0 (*)    Monocytes Absolute 1.1 (*)    All other components within normal limits  BASIC METABOLIC PANEL - Abnormal; Notable for the following components:   Glucose, Bld 133 (*)    BUN 32 (*)    Creatinine, Ser 1.33 (*)    GFR, Estimated 38 (*)    All other components within normal limits  URINALYSIS, ROUTINE W REFLEX MICROSCOPIC - Abnormal; Notable for the following components:   Color, Urine YELLOW (*)    APPearance HAZY (*)    Nitrite POSITIVE (*)    Leukocytes,Ua MODERATE (*)    Bacteria, UA MANY (*)    All other components within normal limits  URINE CULTURE  TROPONIN I (HIGH SENSITIVITY)     MDM    No significant leukocytosis.  Creatinine at baseline.  No significant electrolyte abnormalities.  Troponin is negative, no chest pain, have low suspicion for ACS.  On review of past echocardiogram done in 2022, no findings of aortic stenosis, no murmur on exam  Patient is at her baseline.  Able to ambulate in the emergency  department without any difficulties.  Findings concerning for urinary tract infection.  No orthostatic hypotension.  Given first dose of IV Rocephin and urine was sent for culture, will start on Keflex.  Discussed close follow-up with primary care provider and given return precautions.   PROCEDURES:  Critical Care performed: No  Procedures  Patient's presentation is most consistent with acute presentation with potential threat to life or bodily function.   MEDICATIONS ORDERED IN ED: Medications  cefTRIAXone (ROCEPHIN) 1 g in sodium chloride 0.9 % 100 mL IVPB (0 g Intravenous Stopped 12/07/22 2158)    FINAL CLINICAL IMPRESSION(S) / ED DIAGNOSES   Final diagnoses:  Syncope, unspecified syncope type  Acute cystitis without hematuria     Rx / DC Orders   ED Discharge Orders          Ordered    cephALEXin (KEFLEX) 500 MG capsule  2 times daily        12/07/22 2127             Note:  This document was prepared using Dragon voice recognition software and may include unintentional dictation errors.   Corena Herter, MD 12/07/22 2356

## 2022-12-07 NOTE — Discharge Instructions (Signed)
You were seen in the urgency department following an episode of passing out.  You are diagnosed with a urinary tract infection.  It is importantly stay hydrated and drink lots of water.  You are given your first dose of antibiotics in the emergency department and given a prescription for an antibiotic.  Make sure that whenever you go to the bathroom you always wipe from front to back.  Take frequent showers and make sure that you are staying clean.  Your urine was sent for culture.  Follow-up closely with your primary care physician and return to the emergency department for any worsening symptoms.  Thank you for choosing [place] for your health care, it was my pleasure to care for you today!  Corena Herter, MD

## 2022-12-07 NOTE — ED Triage Notes (Signed)
Pt was shopping around at the [employer] when she was noted by staff to be walking "funny, pt then had a syncopal episode and fell into the floor. Pt does not remember the incident. No blood thinners. Pt reports now she just feels "off", has dizziness and nausea.

## 2022-12-07 NOTE — ED Notes (Signed)
Patient and family given discharge instructions including prescriptions x1 and importance of follow up appt with stated understanding. INT removed, cannula intact, pressure dressing applied. Patient stable and ambulatory with steady even gait on dispo.

## 2022-12-10 LAB — URINE CULTURE: Culture: >=100000 — AB

## 2022-12-11 NOTE — Progress Notes (Unsigned)
      Established patient visit   Patient: Kathy Pacheco   DOB: 04-Oct-1931   89 y.o. Female  MRN: [id number] Visit Date: 12/12/2022  Today's healthcare provider: Sherlyn Hay, DO   No chief complaint on file.  Subjective    HPI   ***  Recently seen in ER for syncope secondary to UTI on 12/07/2022    Medications: Outpatient Medications Prior to Visit  Medication Sig   acetaminophen (TYLENOL) 500 MG tablet Take 500 mg by mouth every 6 (six) hours as needed for mild pain.   albuterol (VENTOLIN HFA) 108 (90 Base) MCG/ACT inhaler Inhale 2 puffs into the lungs every 6 (six) hours as needed for wheezing or shortness of breath.   amLODipine (NORVASC) 5 MG tablet Take 1 tablet (5 mg total) by mouth daily.   aspirin EC 81 MG tablet Take 81 mg by mouth daily. Swallow whole.   cephALEXin (KEFLEX) 500 MG capsule Take 1 capsule (500 mg total) by mouth 2 (two) times daily for 5 days.   famotidine (PEPCID) 40 MG tablet Take 40 mg by mouth at bedtime.   ketoconazole (NIZORAL) 2 % shampoo 2 to 3 times per week lather on scalp, leave on 5-8 minutes, rinse out.   mometasone (ELOCON) 0.1 % cream Apply to aa's shoulders and arms QAM Monday through Friday.   pantoprazole (PROTONIX) 40 MG tablet 40 mg 2 (two) times daily. 30 minutes before meals   rosuvastatin (CRESTOR) 10 MG tablet TAKE 1 TABLET BY MOUTH EVERY DAY   sertraline (ZOLOFT) 25 MG tablet TAKE 1 TABLET (25 MG TOTAL) BY MOUTH DAILY.   triamcinolone cream (KENALOG) 0.1 % twice daily to affected areas on body 2 weeks on 2 weeks off as needed for itching. Avoid applying to face, groin, and axilla.   No facility-administered medications prior to visit.    Review of Systems  ***  {Insert previous labs (optional):23779} {See past labs  Heme  Chem  Endocrine  Serology  Results Review (optional):1}   Objective    There were no vitals taken for this visit. {Insert last BP/Wt (optional):23777}{See vitals history  (optional):1}   Physical Exam  ***  No results found for any visits on 12/12/22.  Assessment & Plan    There are no diagnoses linked to this encounter.   ***  No follow-ups on file.      I discussed the assessment and treatment plan with the patient  The patient was provided an opportunity to ask questions and all were answered. The patient agreed with the plan and demonstrated an understanding of the instructions.   The patient was advised to call back or seek an in-person evaluation if the symptoms worsen or if the condition fails to improve as anticipated.    Sherlyn Hay, DO  [facility] [phone] (phone) [phone] (fax)  [facility]

## 2022-12-12 ENCOUNTER — Encounter: Payer: Self-pay | Admitting: Family Medicine

## 2022-12-12 ENCOUNTER — Ambulatory Visit (INDEPENDENT_AMBULATORY_CARE_PROVIDER_SITE_OTHER): Payer: Medicare Other | Admitting: Family Medicine

## 2022-12-12 VITALS — BP 149/65 | HR 65 | Wt 115.7 lb

## 2022-12-12 DIAGNOSIS — E7800 Pure hypercholesterolemia, unspecified: Secondary | ICD-10-CM | POA: Diagnosis not present

## 2022-12-12 DIAGNOSIS — Z09 Encounter for follow-up examination after completed treatment for conditions other than malignant neoplasm: Secondary | ICD-10-CM | POA: Insufficient documentation

## 2022-12-12 DIAGNOSIS — D649 Anemia, unspecified: Secondary | ICD-10-CM | POA: Diagnosis not present

## 2022-12-12 DIAGNOSIS — G3184 Mild cognitive impairment, so stated: Secondary | ICD-10-CM | POA: Diagnosis not present

## 2022-12-12 DIAGNOSIS — I10 Essential (primary) hypertension: Secondary | ICD-10-CM | POA: Diagnosis not present

## 2022-12-12 DIAGNOSIS — F339 Major depressive disorder, recurrent, unspecified: Secondary | ICD-10-CM

## 2022-12-12 DIAGNOSIS — E538 Deficiency of other specified B group vitamins: Secondary | ICD-10-CM | POA: Insufficient documentation

## 2022-12-12 MED ORDER — SERTRALINE HCL 50 MG PO TABS: 50.0000 mg | ORAL_TABLET | Freq: Every day | ORAL | 0 refills | Status: DC

## 2022-12-12 NOTE — Assessment & Plan Note (Signed)
Patient continues to exhibit no interest in engaging with other people and leaving her house if not necessary, where she was previously a very social person.  This likely is reflective of continued depression.   Per discussion with her daughter, who is her legal guardian, with her use but there is still a lot of we will go ahead and increase her sertraline as noted below.

## 2022-12-12 NOTE — Assessment & Plan Note (Signed)
BP elevated today. However, per discussion with patient's daughter, we will continue on her current dosing of amlodipine 5 mg due to risk of fall and fracture if her blood pressure drops too low and/or she becomes dizzy related to it.

## 2022-12-12 NOTE — Patient Instructions (Addendum)
Academic librarian https://[facility].com/refer/ 1-610-960-4540 or [phone]

## 2022-12-12 NOTE — Assessment & Plan Note (Signed)
Doing well. Has almost completed antibiotic (1 pill left) No acute concerns.

## 2022-12-12 NOTE — Assessment & Plan Note (Addendum)
Patient has history of low vitamin B-12 of 194 on 06/12/2019. She never received supplementation and it has not been checked since. Will recheck vitamin D B12 level today.

## 2022-12-12 NOTE — Assessment & Plan Note (Signed)
Taking vitamin B12 and iron panel today, as low levels may be contributing to patient's decrease in cognition.  Will supplement if appropriate.

## 2022-12-12 NOTE — Assessment & Plan Note (Signed)
Patient has history of normocytic anemia with no iron panel on file.  Will go ahead and check an iron panel today. Will discuss supplementation with patient's daughter if appropriate.

## 2022-12-13 LAB — IRON,TIBC AND FERRITIN PANEL
Ferritin: 19 ng/mL (ref 15–150)
Iron Saturation: 13 % — ABNORMAL LOW (ref 15–55)
Iron: 49 ug/dL (ref 27–139)
Total Iron Binding Capacity: 369 ug/dL (ref 250–450)
UIBC: 320 ug/dL (ref 118–369)

## 2022-12-13 LAB — LIPID PANEL
Chol/HDL Ratio: 2.8 ratio (ref 0.0–4.4)
Cholesterol, Total: 164 mg/dL (ref 100–199)
HDL: 58 mg/dL (ref >39)
LDL Chol Calc (NIH): 89 mg/dL (ref 0–99)
Triglycerides: 94 mg/dL (ref 0–149)
VLDL Cholesterol Cal: 17 mg/dL (ref 5–40)

## 2022-12-13 LAB — COMPREHENSIVE METABOLIC PANEL
ALT: 9 [IU]/L (ref 0–32)
AST: 14 [IU]/L (ref 0–40)
Albumin: 4.1 g/dL (ref 3.7–4.7)
Alkaline Phosphatase: 101 [IU]/L (ref 44–121)
BUN/Creatinine Ratio: 24 (ref 12–28)
BUN: 29 mg/dL — ABNORMAL HIGH (ref 8–27)
Bilirubin Total: 0.3 mg/dL (ref 0.0–1.2)
CO2: 23 mmol/L (ref 20–29)
Calcium: 9.9 mg/dL (ref 8.7–10.3)
Chloride: 102 mmol/L (ref 96–106)
Creatinine, Ser: 1.23 mg/dL — ABNORMAL HIGH (ref 0.57–1.00)
Globulin, Total: 3 g/dL (ref 1.5–4.5)
Glucose: 83 mg/dL (ref 70–99)
Potassium: 4.9 mmol/L (ref 3.5–5.2)
Sodium: 138 mmol/L (ref 134–144)
Total Protein: 7.1 g/dL (ref 6.0–8.5)
eGFR: 42 mL/min/{1.73_m2} — ABNORMAL LOW (ref >59)

## 2022-12-13 LAB — VITAMIN B12: Vitamin B-12: 237 pg/mL (ref 232–1245)

## 2022-12-15 ENCOUNTER — Telehealth: Payer: Self-pay

## 2022-12-15 NOTE — Telephone Encounter (Signed)
Kathy Pacheco is calling in wanting to speak with pt's PCP about her blood work. Kathy Pacheco wants to know is there anything they need to pick up from the pharmacy or what the plan is moving forward since some of pt's vitamin levels were low.

## 2022-12-15 NOTE — Telephone Encounter (Unsigned)
Copied from CRM [id number]. Topic: General - Other >> Dec 15, 2022  8:33 AM Phill Myron wrote: Kathy Pacheco ask Please call regarding her blood work

## 2022-12-16 ENCOUNTER — Encounter: Payer: Self-pay | Admitting: Dermatology

## 2022-12-19 ENCOUNTER — Telehealth: Payer: Self-pay | Admitting: Family Medicine

## 2022-12-19 ENCOUNTER — Ambulatory Visit: Payer: Self-pay | Admitting: Licensed Clinical Social Worker

## 2022-12-19 NOTE — Telephone Encounter (Signed)
Patients daughter is requesting a call back about her moms labs results so she'll know what needs to be done from here. Please f/u with patient

## 2022-12-19 NOTE — Patient Outreach (Signed)
  Care Coordination   Follow Up Visit Note   12/19/2022 Name: Kathy Pacheco MRN: [id number] DOB: Apr 18, 1931  Kathy Pacheco is a 89 y.o. year old female who sees Pardue, Monico Blitz, DO for primary care. I spoke with  Kathy Pacheco / Kathy Pacheco, daughter of client via phone today.  What matters to the patients health and wellness today? patient has anxiety issues in some social settings. She is not wanting to appear as if she needs help with daily activities.    Goals Addressed             This Visit's Progress    patient has anxiety issues in some social settings. She is not wanting to appear as if she needs help with daily activities.       Interventions:  Spoke with  Kathy Pacheco, daughter of client, about client needs and status Client has care provider that helps her a few hours per week as scheduled.  Discussed medication procurement of client Discussed skin care of client. Client seems to pick at certain areas of skin when she is anxious. [name] said MD has increased client dosage of Zoloft to 50 mg. [name] is hoping that this will help with client behavior and qualify of life Reviewed upcoming client appointments [name] said client is taking medications as prescribed.   Client is attending scheduled medical appointments. Client has no transportation needs Discussed anxiety issues of client. [name] said client sometimes gets anxious when speaking of family issues from the past; and, client may start picking at her skin during these times Discussed sleeping issues of client Discussed pain issues of client.  Discussed vision of client.  Discussed ambulation of client. [name] did not mention any client ambulation issues Discussed mood of client.  Client mood is stable at present (per Kathy Pacheco) Discussed client use of hearing aids. Hearing aids are very helpful to client.  Discussed client interactions with caregiver in the home  Thanked Kathy Pacheco for phone call with  LCSW Encouraged client or Kathy Pacheco to call LCSW as needed for SW support for client at [phone].           SDOH assessments and interventions completed:  Yes  SDOH Interventions Today    Flowsheet Row Most Recent Value  SDOH Interventions   Depression Interventions/Treatment  Counseling, Medication  Physical Activity Interventions Other (Comments)  [occasional mobility challenges]  Stress Interventions Other (Comment)  [client has stress related to managing medical needs]        Care Coordination Interventions:  Yes, provided   Interventions Today    Flowsheet Row Most Recent Value  Chronic Disease   Chronic disease during today's visit Other  [spoke with Kathy Pacheco, daughter of client, about client needs]  General Interventions   General Interventions Discussed/Reviewed General Interventions Discussed, Community Resources  Education Interventions   Education Provided Provided Education  Provided Verbal Education On [employer]  Mental Health Interventions   Mental Health Discussed/Reviewed Coping Strategies  [using coping skills to try to manage anxiety issues. likes to take a nap,  likes to watch news channel]  Nutrition Interventions   Nutrition Discussed/Reviewed Nutrition Discussed  Pharmacy Interventions   Pharmacy Dicussed/Reviewed Pharmacy Topics Discussed       Follow up plan: Follow up call scheduled for 01/30/23 at 10:00 AM    Encounter Outcome:  Patient Visit Completed   Kelton Pillar.Jad Johansson MSW, LCSW Licensed Visual merchandiser [facility] Care Management [phone]

## 2022-12-19 NOTE — Patient Instructions (Signed)
Visit Information  Thank you for taking time to visit with me today. Please don't hesitate to contact me if I can be of assistance to you.   Following are the goals we discussed today:   Goals Addressed             This Visit's Progress    patient has anxiety issues in some social settings. She is not wanting to appear as if she needs help with daily activities.       Interventions:  Spoke with  Kathy Pacheco, daughter of client, about client needs and status Client has care provider that helps her a few hours per week as scheduled.  Discussed medication procurement of client Discussed skin care of client. Client seems to pick at certain areas of skin when she is anxious. [name] said MD has increased client dosage of Zoloft to 50 mg. [name] is hoping that this will help with client behavior and qualify of life Reviewed upcoming client appointments [name] said client is taking medications as prescribed.   Client is attending scheduled medical appointments. Client has no transportation needs Discussed anxiety issues of client. [name] said client sometimes gets anxious when speaking of family issues from the past; and, client may start picking at her skin during these times Discussed sleeping issues of client Discussed pain issues of client.  Discussed vision of client.  Discussed ambulation of client. [name] did not mention any client ambulation issues Discussed mood of client.  Client mood is stable at present (per Kathy Pacheco) Discussed client use of hearing aids. Hearing aids are very helpful to client.  Discussed client interactions with caregiver in the home  Thanked Kathy Pacheco for phone call with LCSW Encouraged client or Kathy Pacheco to call LCSW as needed for SW support for client at [phone].           Our next appointment is by telephone on 01/30/23 at 10:00 AM   Please call the care guide team at [phone] if you need to cancel or reschedule your appointment.    If you are experiencing a Mental Health or Behavioral Health Crisis or need someone to talk to, please go to [facility] [address] [place] [phone])   The patient / Kathy Pacheco, daughter of patient, verbalized understanding of instructions, educational materials, and care plan provided today and DECLINED offer to receive copy of patient instructions, educational materials, and care plan.   The patient / Kathy Pacheco, daughter of patient, has been provided with contact information for the care management team and has been advised to call with any health related questions or concerns.   Kelton Pillar.Rainn Bullinger MSW, LCSW Licensed Visual merchandiser [facility] Care Management [phone]

## 2022-12-20 NOTE — Telephone Encounter (Signed)
Discussed continued hydration and starting a ferrous sulfate 325 mg once daily supplements, as well as a vitamin B12 1000 mcg daily supplement.  Also confirmed for patient's daughter that the organism which grew from the patient's urinary tract infection was sensitive to the antibiotic received at the emergency department.  She notes that her mom does seem to have improved and has become more active lately.

## 2023-01-30 ENCOUNTER — Ambulatory Visit: Payer: Self-pay | Admitting: Licensed Clinical Social Worker

## 2023-01-30 NOTE — Patient Outreach (Signed)
  Care Coordination   Follow Up Visit Note   01/30/2023 Name: Kathy Pacheco MRN: [id number] DOB: [birth year]  Kathy Pacheco is a 89 y.o. year old female who sees Pardue, Monico Blitz, DO for primary care. I spoke with  Kathy Pacheco by phone today.  What matters to the patients health and wellness today? patient has anxiety issues in some social settings. She is not wanting to appear as if she needs help with daily activities.     Goals Addressed             This Visit's Progress    patient has anxiety issues in some social settings. She is not wanting to appear as if she needs help with daily activities.       Interventions:  Spoke with client via phont today about client needs Client said she was doing pretty well at present. She said she is looking forward to holiday time. Client said she has a caregiver who helps her in the home for a few hours weekly as scheduled. Client said caregiver is very helpful to client.  Spoke with client about pain issues of client Spoke with client about appetite of client Discussed medication procurement of client Discussed transport needs of client. Kathy Pacheco said that her daughter, Kathy Pacheco, helps transport Kathy Pacheco to and from appointments for Kathy Pacheco Discussed hearing issues of client. Client said she is using her hearing aids and use of hearing aids is very helpful to client. She said she can communicate well with others when she uses her hearing aids  Discussed ambulation of client. Client said she was not having any walking problems Thanked client for phone call with LCSW today Encouraged client or Kathy Pacheco to call LCSW as needed for SW support for client at [phone].            SDOH assessments and interventions completed:  Yes  SDOH Interventions Today    Flowsheet Row Most Recent Value  SDOH Interventions   Depression Interventions/Treatment  Counseling, Medication  Physical Activity Interventions Other (Comments)  [may have  decreased activities]  Stress Interventions Provide Counseling  [risk of social isolation . Has stress in leaving home environment. Has stress in managing medical needs]        Care Coordination Interventions:  Yes, provided   Interventions Today    Flowsheet Row Most Recent Value  Chronic Disease   Chronic disease during today's visit Other  [spoke with client about client needs]  General Interventions   General Interventions Discussed/Reviewed General Interventions Discussed, Community Resources  Education Interventions   Education Provided Provided Education  Provided Verbal Education On [employer]  Mental Health Interventions   Mental Health Discussed/Reviewed Coping Strategies  [client said she thought her mood was stable at present]  Nutrition Interventions   Nutrition Discussed/Reviewed Nutrition Discussed  Pharmacy Interventions   Pharmacy Dicussed/Reviewed Pharmacy Topics Discussed  Safety Interventions   Safety Discussed/Reviewed Fall Risk        Follow up plan: Follow up call scheduled for 04/09/23 at 9:30 AM    Encounter Outcome:  Patient Visit Completed   Kelton Pillar.[employer] MSW, LCSW Licensed Visual merchandiser [facility] Care Management [phone]

## 2023-01-30 NOTE — Patient Instructions (Signed)
Visit Information  Thank you for taking time to visit with me today. Please don't hesitate to contact me if I can be of assistance to you.   Following are the goals we discussed today:   Goals Addressed             This Visit's Progress    patient has anxiety issues in some social settings. She is not wanting to appear as if she needs help with daily activities.       Interventions:  Spoke with client via phont today about client needs Client said she was doing pretty well at present. She said she is looking forward to holiday time. Client said she has a caregiver who helps her in the home for a few hours weekly as scheduled. Client said caregiver is very helpful to client.  Spoke with client about pain issues of client Spoke with client about appetite of client Discussed medication procurement of client Discussed transport needs of client. Hailen said that her daughter, [name], helps transport Roquel to and from appointments for [name] Discussed hearing issues of client. Client said she is using her hearing aids and use of hearing aids is very helpful to client. She said she can communicate well with others when she uses her hearing aids  Discussed ambulation of client. Client said she was not having any walking problems Thanked client for phone call with LCSW today Encouraged client or [name] to call LCSW as needed for SW support for client at [phone].            Our next appointment is by telephone on 04/09/23 at 9:30 AM   Please call the care guide team at [phone] if you need to cancel or reschedule your appointment.   If you are experiencing a Mental Health or Behavioral Health Crisis or need someone to talk to, please go to [facility] [address], [place] [phone])   The patient verbalized understanding of instructions, educational materials, and care plan provided today and DECLINED offer to receive  copy of patient instructions, educational materials, and care plan.   The patient has been provided with contact information for the care management team and has been advised to call with any health related questions or concerns.   Kelton Pillar.Paulett Kaufhold MSW, LCSW Licensed Visual merchandiser [facility] Care Management [phone]

## 2023-02-06 ENCOUNTER — Other Ambulatory Visit: Payer: Self-pay | Admitting: Physician Assistant

## 2023-02-06 DIAGNOSIS — I10 Essential (primary) hypertension: Secondary | ICD-10-CM

## 2023-02-16 ENCOUNTER — Other Ambulatory Visit: Payer: Self-pay | Admitting: Family Medicine

## 2023-02-18 ENCOUNTER — Other Ambulatory Visit: Payer: Self-pay | Admitting: Family Medicine

## 2023-02-18 DIAGNOSIS — G3184 Mild cognitive impairment, so stated: Secondary | ICD-10-CM

## 2023-02-18 DIAGNOSIS — F339 Major depressive disorder, recurrent, unspecified: Secondary | ICD-10-CM

## 2023-03-01 ENCOUNTER — Encounter

## 2023-03-06 ENCOUNTER — Ambulatory Visit: Payer: Medicare Other

## 2023-03-06 VITALS — BP 120/68 | Ht 62.0 in | Wt 123.1 lb

## 2023-03-06 DIAGNOSIS — Z0000 Encounter for general adult medical examination without abnormal findings: Secondary | ICD-10-CM

## 2023-03-06 NOTE — Progress Notes (Signed)
Subjective:   Kathy Pacheco is a 89 y.o. female who presents for Medicare Annual (Subsequent) preventive examination.  Visit Complete: In person  Cardiac Risk Factors include: advanced age (>2men, >69 women);dyslipidemia;hypertension     Objective:    Today's Vitals   03/06/23 1507  BP: 120/68  Weight: 123 lb 1.6 oz (55.8 kg)  Height: 5\' 2"  (1.575 m)   Body mass index is 22.52 kg/m.     03/06/2023    3:20 PM 12/07/2022    2:14 PM 02/27/2022    1:32 PM 06/18/2021   11:59 AM 02/23/2021    1:48 PM 09/10/2020    9:55 AM 07/21/2020    7:06 AM  Advanced Directives  Does Patient Have a Medical Advance Directive? No No No Yes No No No  Type of Theme park manager;Living will     Would patient like information on creating a medical advance directive? No - Patient declined  No - Patient declined No - Patient declined No - Patient declined  No - Patient declined    Current Medications (verified) Outpatient Encounter Medications as of 03/06/2023  Medication Sig   acetaminophen (TYLENOL) 500 MG tablet Take 500 mg by mouth every 6 (six) hours as needed for mild pain.   amLODipine (NORVASC) 5 MG tablet TAKE 1 TABLET (5 MG TOTAL) BY MOUTH DAILY.   cyanocobalamin (VITAMIN B12) 100 MCG tablet Take 100 mcg by mouth daily.   famotidine (PEPCID) 40 MG tablet Take 40 mg by mouth at bedtime.   ferrous sulfate 325 (65 FE) MG EC tablet Take 325 mg by mouth daily with breakfast.   mometasone (ELOCON) 0.1 % cream Apply to aa's shoulders and arms QAM Monday through Friday.   pantoprazole (PROTONIX) 40 MG tablet 40 mg 2 (two) times daily. 30 minutes before meals   rosuvastatin (CRESTOR) 10 MG tablet TAKE 1 TABLET BY MOUTH EVERY DAY   sertraline (ZOLOFT) 50 MG tablet TAKE 1 TABLET BY MOUTH EVERY DAY   albuterol (VENTOLIN HFA) 108 (90 Base) MCG/ACT inhaler Inhale 2 puffs into the lungs every 6 (six) hours as needed for wheezing or shortness of breath. (Patient not taking:  Reported on 03/06/2023)   No facility-administered encounter medications on file as of 03/06/2023.    Allergies (verified) Ciprofloxacin   History: Past Medical History:  Diagnosis Date   Anxiety    Depression    Diverticulosis    GERD (gastroesophageal reflux disease)    Hyperlipidemia    Hypertension    PONV (postoperative nausea and vomiting)    PVD (peripheral vascular disease) (HCC)    Sleep apnea    Past Surgical History:  Procedure Laterality Date   CATARACT EXTRACTION Bilateral    COLONOSCOPY     ESOPHAGOGASTRODUODENOSCOPY (EGD) WITH PROPOFOL N/A 09/10/2020   Procedure: ESOPHAGOGASTRODUODENOSCOPY (EGD) WITH PROPOFOL;  Surgeon: Earline Mayotte, MD;  Location: [facility] ENDOSCOPY;  Service: Endoscopy;  Laterality: N/A;   ETHMOIDECTOMY Bilateral 03/10/2016   Procedure: ETHMOIDECTOMY;  Surgeon: Linus Salmons, MD;  Location: [facility];  Service: ENT;  Laterality: Bilateral;   FRONTAL SINUS EXPLORATION Bilateral 03/10/2016   Procedure: FRONTAL WITH TISSUE REMOVAL;  Surgeon: Linus Salmons, MD;  Location: [facility];  Service: ENT;  Laterality: Bilateral;   HERNIA REPAIR     IMAGE GUIDED SINUS SURGERY Bilateral 03/10/2016   Procedure: IMAGE GUIDED SINUS SURGERY;  Surgeon: Linus Salmons, MD;  Location: [facility];  Service: ENT;  Laterality: Bilateral;  Need  Disk GAVE DISK TO CECE 1-25 KP   MAXILLARY ANTROSTOMY Bilateral 03/10/2016   Procedure: MAXILLARY ANTROSTOMY;  Surgeon: Linus Salmons, MD;  Location: [facility];  Service: ENT;  Laterality: Bilateral;   NASAL TURBINATE REDUCTION Bilateral 03/10/2016   Procedure: TURBINATE REDUCTION/SUBMUCOSAL RESECTION;  Surgeon: Linus Salmons, MD;  Location: [facility];  Service: ENT;  Laterality: Bilateral;   SEPTOPLASTY Bilateral 03/10/2016   Procedure: SEPTOPLASTY;  Surgeon: Linus Salmons, MD;  Location: [facility];  Service: ENT;  Laterality: Bilateral;   SPHENOIDECTOMY  Bilateral 03/10/2016   Procedure: Selina Cooley WITH TISSUE REMOVAL;  Surgeon: Linus Salmons, MD;  Location: [facility];  Service: ENT;  Laterality: Bilateral;   Family History  Problem Relation Age of Onset   Epilepsy Mother    Osteoporosis Mother    Coronary artery disease Mother    COPD Father    Hypertension Father    CVA Father    Allergies Father    Lung cancer Father    Emphysema Father    Cancer Sister        breast   Breast cancer Sister 47   Osteoporosis Sister    COPD Sister    Coronary artery disease Brother    CVA Brother    COPD Brother    Heart disease Brother    Arthritis Brother    CVA Brother    Cancer Brother        cancer   Healthy Brother    Healthy Brother    Cancer Maternal Grandmother    Hypertension Maternal Grandmother    Social History   Socioeconomic History   Marital status: Widowed    Spouse name: Not on file   Number of children: 1   Years of education: Not on file   Highest education level: High school graduate  Occupational History   Occupation: retired  Tobacco Use   Smoking status: Never   Smokeless tobacco: Never  Vaping Use   Vaping status: Never Used  Substance and Sexual Activity   Alcohol use: No    Alcohol/week: 0.0 standard drinks of alcohol   Drug use: No   Sexual activity: Not on file  Other Topics Concern   Not on file  Social History Narrative   Not on file   Social Drivers of Health   Financial Resource Strain: Low Risk  (03/06/2023)   Overall Financial Resource Strain (CARDIA)    Difficulty of Paying Living Expenses: Not hard at all  Food Insecurity: No Food Insecurity (03/06/2023)   Hunger Vital Sign    Worried About Running Out of Food in the Last Year: Never true    Ran Out of Food in the Last Year: Never true  Transportation Needs: No Transportation Needs (03/06/2023)   PRAPARE - Administrator, Civil Service (Medical): No    Lack of Transportation (Non-Medical): No  Physical  Activity: Sufficiently Active (03/06/2023)   Exercise Vital Sign    Days of Exercise per Week: 6 days    Minutes of Exercise per Session: 30 min  Recent Concern: Physical Activity - Inactive (01/30/2023)   Exercise Vital Sign    Days of Exercise per Week: 0 days    Minutes of Exercise per Session: 0 min  Stress: No Stress Concern Present (03/06/2023)   [employer] of Occupational Health - Occupational Stress Questionnaire    Feeling of Stress : Not at all  Recent Concern: Stress - Stress Concern Present (01/30/2023)   [employer] of  Occupational Health - Occupational Stress Questionnaire    Feeling of Stress : To some extent  Social Connections: Moderately Isolated (03/06/2023)   Social Connection and Isolation Panel [NHANES]    Frequency of Communication with Friends and Family: Three times a week    Frequency of Social Gatherings with Friends and Family: More than three times a week    Attends Religious Services: More than 4 times per year    Active Member of Clubs or Organizations: No    Attends Banker Meetings: Never    Marital Status: Widowed    Tobacco Counseling Counseling given: Not Answered   Clinical Intake:  Pre-visit preparation completed: Yes  Pain : No/denies pain     BMI - recorded: 22.52 Nutritional Status: BMI of 19-24  Normal Nutritional Risks: None Diabetes: No  How often do you need to have someone help you when you read instructions, pamphlets, or other written materials from your doctor or pharmacy?: 1 - Never  Interpreter Needed?: No  Information entered by :: Kennedy Bucker, LPN   Activities of Daily Living    03/06/2023    3:22 PM 11/21/2022    3:53 PM  In your present state of health, do you have any difficulty performing the following activities:  Hearing? 1 1  Vision? 0 0  Difficulty concentrating or making decisions? 1 1  Walking or climbing stairs? 0 0  Dressing or bathing? 0 0  Doing errands, shopping? 0 1   Comment  still drives, but children bring her to appointments; [place]'t [place] on interstate anymore.  Preparing Food and eating ? N   Using the Toilet? N   In the past six months, have you accidently leaked urine? N   Do you have problems with loss of bowel control? N   Managing your Medications? Y   Managing your Finances? Y   Housekeeping or managing your Housekeeping? N     Patient Care Team: Sherlyn Hay, DO as PCP - General (Family Medicine) Linus Salmons, MD (Otolaryngology) Sallee Lange, MD (Ophthalmology) Lady Gary Darlin Priestly, MD as Consulting Physician (Cardiology) Smitty Cords Nolon Bussing., MD (Dentistry) Isla Pence, OD (Optometry)  Indicate any recent Medical Services you may have received from other than [facility] providers in the past year (date may be approximate).     Assessment:   This is a routine wellness examination for Zhanae.  Hearing/Vision screen Hearing Screening - Comments:: WEARS AIDS, BOTH EARS Vision Screening - Comments:: READERS- [facility]   Goals Addressed             This Visit's Progress    Cut out extra servings         Depression Screen    03/06/2023    3:16 PM 01/30/2023    9:54 AM 12/19/2022    2:19 PM 12/12/2022    3:54 PM 12/06/2022   10:39 AM 11/21/2022    3:41 PM 10/31/2022    9:51 AM  PHQ 2/9 Scores  PHQ - 2 Score 2 2 2  0 2  2  PHQ- 9 Score  6 7 0 7  6  Exception Documentation      Patient refusal     Fall Risk    03/06/2023    3:21 PM 12/12/2022    4:03 PM 05/15/2022    1:44 PM 04/05/2022    1:07 PM 02/27/2022    1:33 PM  Fall Risk   Falls in the past year? 0 1 1 1 1   Number falls  in past yr: 0 1 0 0 1  Injury with Fall? 0 0 0 0 0  Risk for fall due to : No Fall Risks History of fall(s) History of fall(s)  History of fall(s);Impaired balance/gait;Mental status change  Follow up Falls prevention discussed;Falls evaluation completed  Falls evaluation completed  Falls evaluation completed;Falls prevention discussed     MEDICARE RISK AT HOME: Medicare Risk at Home Any stairs in or around the home?: Yes If so, are there any without handrails?: No Home free of loose throw rugs in walkways, pet beds, electrical cords, etc?: Yes Adequate lighting in your home to reduce risk of falls?: Yes Life alert?: No Use of a cane, walker or w/c?: No Grab bars in the bathroom?: Yes Shower chair or bench in shower?: Yes Elevated toilet seat or a handicapped toilet?: Yes  TIMED UP AND GO:  Was the test performed?  Yes  Length of time to ambulate 10 feet: 4 sec Gait steady and fast without use of assistive device    Cognitive Function:    02/21/2022    4:48 PM 11/11/2021    2:17 PM 05/17/2021   12:04 PM 11/29/2020   10:49 AM 06/25/2019   10:30 AM  MMSE - Mini Mental State Exam  Orientation to time 4 5 3 4 5   Orientation to Place 5 5 4 5 5   Registration 3 3 3 3 3   Attention/ Calculation 5 5 3 5 5   Recall 0 3 2 0 3  Language- name 2 objects 2 2 2 2 2   Language- repeat 1 1 1 1 1   Language- follow 3 step command 3 3 3 3 3   Language- read & follow direction 1 1 1 1 1   Write a sentence 1 1 1 1 1   Copy design 0 1 1 1 1   Total score 25 30 24 26 30         03/06/2023    3:24 PM 11/21/2022    3:56 PM 02/27/2022    1:41 PM 02/07/2018   10:10 AM  6CIT Screen  What Year? 0 points 0 points 0 points 0 points  What month? 3 points 3 points 0 points 0 points  What time? 0 points 0 points 0 points 0 points  Count back from 20 0 points 2 points 4 points 0 points  Months in reverse 0 points 0 points 4 points 0 points  Repeat phrase 6 points 4 points 10 points 0 points  Total Score 9 points 9 points 18 points 0 points    Immunizations Immunization History  Administered Date(s) Administered   Fluad Quad(high Dose 65+) 10/22/2019, 11/29/2020, 11/16/2021   Fluad Trivalent(High Dose 65+) 11/21/2022   Influenza Split 12/02/2005, 11/19/2010, 12/02/2011   Influenza, High Dose Seasonal PF 10/28/2013, 11/04/2014,  11/04/2015, 11/22/2016, 11/21/2017, 10/30/2018   Influenza,inj,Quad PF,6+ Mos 11/02/2012   PFIZER(Purple Top)SARS-COV-2 Vaccination 02/27/2019, 03/20/2019, 03/10/2020   Pneumococcal Conjugate-13 08/04/2013   Pneumococcal Polysaccharide-23 05/02/2009   Td 09/10/2007   Tdap 09/20/2020    TDAP status: Up to date  Flu Vaccine status: Up to date  Pneumococcal vaccine status: Declined,  Education has been provided regarding the importance of this vaccine but patient still declined. Advised may receive this vaccine at local pharmacy or Health Dept. Aware to provide a copy of the vaccination record if obtained from local pharmacy or Health Dept. Verbalized acceptance and understanding.   Covid-19 vaccine status: Completed vaccines  Qualifies for Shingles Vaccine? Yes   Zostavax completed No  Shingrix Completed?: No.    Education has been provided regarding the importance of this vaccine. Patient has been advised to call insurance company to determine out of pocket expense if they have not yet received this vaccine. Advised may also receive vaccine at local pharmacy or Health Dept. Verbalized acceptance and understanding.  Screening Tests Health Maintenance  Topic Date Due   Zoster Vaccines- Shingrix (1 of 2) Never done   COVID-19 Vaccine (4 - 2024-25 season) 05/07/2023 (Originally 10/08/2022)   DEXA SCAN  11/21/2023 (Originally 04/03/2020)   Medicare Annual Wellness (AWV)  03/05/2024   DTaP/Tdap/Td (3 - Td or Tdap) 09/21/2030   Pneumonia Vaccine 79+ Years old  Completed   INFLUENZA VACCINE  Completed   HPV VACCINES  Aged Out    Health Maintenance  Health Maintenance Due  Topic Date Due   Zoster Vaccines- Shingrix (1 of 2) Never done    Colorectal cancer screening: No longer required.   Mammogram status: No longer required due to AGE.   Lung Cancer Screening: (Low Dose CT Chest recommended if Age 70-80 years, 20 pack-year currently smoking OR have quit w/in 15years.) does not  qualify.    Additional Screening:  Hepatitis C Screening: does not qualify; Completed NO  Vision Screening: Recommended annual ophthalmology exams for early detection of glaucoma and other disorders of the eye. Is the patient up to date with their annual eye exam?  Yes  Who is the provider or what is the name of the office in which the patient attends annual eye exams? [facility] If pt is not established with a provider, would they like to be referred to a provider to establish care? No .   Dental Screening: Recommended annual dental exams for proper oral hygiene   Community Resource Referral / Chronic Care Management: CRR required this visit?  No   CCM required this visit?  No     Plan:     I have personally reviewed and noted the following in the patient's chart:   Medical and social history Use of alcohol, tobacco or illicit drugs  Current medications and supplements including opioid prescriptions. Patient is not currently taking opioid prescriptions. Functional ability and status Nutritional status Physical activity Advanced directives List of other physicians Hospitalizations, surgeries, and ER visits in previous 12 months Vitals Screenings to include cognitive, depression, and falls Referrals and appointments  In addition, I have reviewed and discussed with patient certain preventive protocols, quality metrics, and best practice recommendations. A written personalized care plan for preventive services as well as general preventive health recommendations were provided to patient.     Hal Hope, LPN   1/61/0960   After Visit Summary: (In Person-Declined) Patient declined AVS at this time.  Nurse Notes: NONE

## 2023-03-06 NOTE — Patient Instructions (Addendum)
Kathy Pacheco , Thank you for taking time to come for your Medicare Wellness Visit. I appreciate your ongoing commitment to your health goals. Please review the following plan we discussed and let me know if I can assist you in the future.   Referrals/Orders/Follow-Ups/Clinician Recommendations: NONE  This is a list of the screening recommended for you and due dates:  Health Maintenance  Topic Date Due   Zoster (Shingles) Vaccine (1 of 2) Never done   COVID-19 Vaccine (4 - 2024-25 season) 05/07/2023*   DEXA scan (bone density measurement)  11/21/2023*   Medicare Annual Wellness Visit  03/05/2024   DTaP/Tdap/Td vaccine (3 - Td or Tdap) 09/21/2030   Pneumonia Vaccine  Completed   Flu Shot  Completed   HPV Vaccine  Aged Out  *Topic was postponed. The date shown is not the original due date.    Advanced directives: (ACP Link)Information on Advanced Care Planning can be found at [facility] Advance Health Care Directives Advance Health Care Directives ([email or web address]   Next Medicare Annual Wellness Visit scheduled for next year: Yes   03/11/24 @ 1:50 PM IN PERSON

## 2023-03-14 ENCOUNTER — Ambulatory Visit: Payer: Self-pay | Admitting: Family Medicine

## 2023-04-09 ENCOUNTER — Ambulatory Visit: Payer: Self-pay | Admitting: Licensed Clinical Social Worker

## 2023-04-09 NOTE — Patient Instructions (Signed)
 Visit Information  Thank you for taking time to visit with me today. Please don't hesitate to contact me if I can be of assistance to you.   Following are the goals we discussed today:   Goals Addressed             This Visit's Progress    patient has anxiety issues in some social settings. She is not wanting to appear as if she needs help with daily activities.       Interventions:  Spoke with client via phont today about client needs and status Client said she was doing pretty well at present. She said she likes to walk occasionally for exercise.   Client has a caregiver who helps her in the home for  a total of 10 hours weekly. Aide helps client with client bathing, helps client with meal procurement, and occasionally takes client on a short outing away from home of client. Client enjoys these short outings away from home Spoke with client about pain issues of client. Client did not mention any pain issues Spoke with client about appetite of client.  Client feels that she is eating well Discussed medication procurement of client Discussed transport needs of client. Aideen said that her daughter, [name], helps transport Ioana to and from appointments for [name] Discussed support from PCP. Client has her next appointment with PCP in April of 2025 Discussed hearing needs of client. She said she wears a hearing aid in each ear and can hear well when wearing hearing aids. Discussed ambulation of client. Client said she was not having any walking problems Discussed client support from her daughter, [name]. Discussed client mood. Client said she did not have any mood issues at present. Client thought that her mood was stable at present Thanked client for phone call with LCSW today Encouraged client or [name] to call LCSW as needed for SW support for client at [phone].          Our next appointment is by telephone on 06/12/23 at 10:00 AM   Please call the care  guide team at [phone] if you need to cancel or reschedule your appointment.   If you are experiencing a Mental Health or Behavioral Health Crisis or need someone to talk to, please go to [facility] [address] [place] [phone])   The patient verbalized understanding of instructions, educational materials, and care plan provided today and DECLINED offer to receive copy of patient instructions, educational materials, and care plan.   The patient has been provided with contact information for the care management team and has been advised to call with any health related questions or concerns.    Lorna Few  MSW, LCSW [place] Licensed Clinical Social Worker Direct Dial:  [phone] Fax:  [phone] Website:  [email or web address]

## 2023-04-09 NOTE — Patient Outreach (Signed)
 Care Coordination   Follow Up Visit Note   04/09/2023 Name: CLORIS FLIPPO MRN: [id number] DOB: 06/17/1931  [name] is a 89 y.o. year old female who sees Pardue, Monico Blitz, DO for primary care. I spoke with  [name] by phone today about client needs and status.  What matters to the patients health and wellness today?  patient has anxiety issues in some social settings. She is not wanting to appear as if she needs help with daily activities.     Goals Addressed             This Visit's Progress    patient has anxiety issues in some social settings. She is not wanting to appear as if she needs help with daily activities.       Interventions:  Spoke with client via phont today about client needs and status Client said she was doing pretty well at present. She said she likes to walk occasionally for exercise.   Client has a caregiver who helps her in the home for  a total of 10 hours weekly. Aide helps client with client bathing, helps client with meal procurement, and occasionally takes client on a short outing away from home of client. Client enjoys these short outings away from home Spoke with client about pain issues of client. Client did not mention any pain issues Spoke with client about appetite of client.  Client feels that she is eating well Discussed medication procurement of client Discussed transport needs of client. Skila said that her daughter, [name], helps transport Tiera to and from appointments for [name] Discussed support from PCP. Client has her next appointment with PCP in April of 2025 Discussed hearing needs of client. She said she wears a hearing aid in each ear and can hear well when wearing hearing aids. Discussed ambulation of client. Client said she was not having any walking problems Discussed client support from her daughter, [name]. Discussed client mood. Client said she did not have any mood issues at present. Client thought that her  mood was stable at present Thanked client for phone call with LCSW today Encouraged client or [name] to call LCSW as needed for SW support for client at [phone].          SDOH assessments and interventions completed:  Yes  SDOH Interventions Today    Flowsheet Row Most Recent Value  SDOH Interventions   Depression Interventions/Treatment  Counseling, Medication  Physical Activity Interventions Other (Comments)  [client does walk occasionally for exercise. Sometimes client has low motivation to do activities]  Stress Interventions Other (Comment)  [client has some stress in managing daily activities]        Care Coordination Interventions:  Yes, provided   Interventions Today    Flowsheet Row Most Recent Value  Chronic Disease   Chronic disease during today's visit Other  [spoke with client about client needs]  General Interventions   General Interventions Discussed/Reviewed General Interventions Discussed, Community Resources  Education Interventions   Education Provided Provided Education  Provided Engineer, [employer]  Mental Health Interventions   Mental Health Discussed/Reviewed Coping Strategies  [client did not mention any mood issues. She feels that her mood is stable at present]  Nutrition Interventions   Nutrition Discussed/Reviewed Nutrition Discussed  [client has help for 10 hours weekly as scheduled from home health aide. Aide helps client with meal procurement]  Pharmacy Interventions   Pharmacy Dicussed/Reviewed Pharmacy Topics Discussed  Safety  Interventions   Safety Discussed/Reviewed Fall Risk        Follow up plan: Follow up call scheduled for 06/12/23 at 10:00 AM    Encounter Outcome:  Patient Visit Completed    Lorna Few  MSW, LCSW [place] Licensed Clinical Social Worker Direct Dial:  [phone] Fax:  [phone] Website:  [email or web address]

## 2023-05-17 ENCOUNTER — Other Ambulatory Visit: Payer: Self-pay | Admitting: Family Medicine

## 2023-05-17 NOTE — Telephone Encounter (Signed)
 Requested Prescriptions  Pending Prescriptions Disp Refills   rosuvastatin (CRESTOR) 10 MG tablet [Pharmacy Med Name: ROSUVASTATIN CALCIUM 10 MG TAB] 90 tablet 0    Sig: TAKE 1 TABLET BY MOUTH EVERY DAY     Cardiovascular:  Antilipid - Statins 2 Failed - 05/17/2023  2:46 PM      Failed - Cr in normal range and within 360 days    Creatinine  Date Value Ref Range Status  07/01/2012 0.97 0.60 - 1.30 mg/dL Final   Creatinine, Ser  Date Value Ref Range Status  12/12/2022 1.23 (H) 0.57 - 1.00 mg/dL Final         Failed - Valid encounter within last 12 months    Recent Outpatient Visits   None     Future Appointments             In 5 days Pardue, Monico Blitz, DO [facility]   In 1 month Deirdre Evener, MD [facility]            Failed - Lipid Panel in normal range within the last 12 months    Cholesterol, Total  Date Value Ref Range Status  12/12/2022 164 100 - 199 mg/dL Final   LDL Chol Calc (NIH)  Date Value Ref Range Status  12/12/2022 89 0 - 99 mg/dL Final   HDL  Date Value Ref Range Status  12/12/2022 58 >39 mg/dL Final   Triglycerides  Date Value Ref Range Status  12/12/2022 94 0 - 149 mg/dL Final         Passed - Patient is not pregnant

## 2023-05-22 ENCOUNTER — Ambulatory Visit (INDEPENDENT_AMBULATORY_CARE_PROVIDER_SITE_OTHER): Payer: Self-pay | Admitting: Family Medicine

## 2023-05-22 ENCOUNTER — Encounter: Payer: Self-pay | Admitting: Family Medicine

## 2023-05-22 VITALS — BP 127/58 | HR 74 | Resp 16 | Ht 62.0 in | Wt 126.3 lb

## 2023-05-22 DIAGNOSIS — Z09 Encounter for follow-up examination after completed treatment for conditions other than malignant neoplasm: Secondary | ICD-10-CM

## 2023-05-22 DIAGNOSIS — N1832 Chronic kidney disease, stage 3b: Secondary | ICD-10-CM | POA: Diagnosis not present

## 2023-05-22 DIAGNOSIS — J449 Chronic obstructive pulmonary disease, unspecified: Secondary | ICD-10-CM | POA: Diagnosis not present

## 2023-05-22 DIAGNOSIS — G3184 Mild cognitive impairment, so stated: Secondary | ICD-10-CM

## 2023-05-22 DIAGNOSIS — I10 Essential (primary) hypertension: Secondary | ICD-10-CM

## 2023-05-22 DIAGNOSIS — F339 Major depressive disorder, recurrent, unspecified: Secondary | ICD-10-CM

## 2023-05-22 DIAGNOSIS — E611 Iron deficiency: Secondary | ICD-10-CM

## 2023-05-22 DIAGNOSIS — E7800 Pure hypercholesterolemia, unspecified: Secondary | ICD-10-CM | POA: Diagnosis not present

## 2023-05-22 NOTE — Assessment & Plan Note (Signed)
 Patient's son-in-law reports she is doing well on the increased dose of sertraline. - Continue sertraline 50 mg daily.

## 2023-05-22 NOTE — Assessment & Plan Note (Signed)
Chronic, stable. Continue amlodipine 5mg daily. 

## 2023-05-22 NOTE — Assessment & Plan Note (Signed)
Chronic, stable. Continue rosuvastatin 10mg daily.  

## 2023-05-22 NOTE — Progress Notes (Signed)
 Established patient visit   Patient: Kathy Pacheco   DOB: 26-Apr-1931   89 y.o. Female  MRN: [id number] Visit Date: 05/22/2023  Today's healthcare provider: Carlean Charter, DO   Chief Complaint  Patient presents with   Follow-up   Subjective    HPI Kathy Pacheco is an 89 year old female who presents for a routine follow-up visit.  She feels well and has been compliant with her iron supplements, described as a 'green pill,' and vitamin B12. She received a COVID-19 vaccine within the last six months at a pharmacy. She has not planned to receive the newer shingles vaccine.  She is currently on sertraline  and is doing well with it. She remains active and feels good for her age. She has an inhaler but has not needed to use it recently.  Her daughter-in-law notes that her memory is not great, particularly her short-term memory, but her long-term memory remains intact. She sometimes does not listen or follow directions well, a trait she has had her whole life. Her daughter-in-law manages her medications and bills, and she receives meals from [employer]. She can drive short distances and is described as a 'great driver.'  Her daughter-in-law has installed safety features in her home, including a new shower with a seat and rails, and a tracker in her car to monitor her driving. Cameras are also installed at her home for safety. A caregiver visits three times a week to assist with bathing.  She has a history of repeating grades in school due to not listening, and she is the oldest of eight siblings, with better health than any of them. Her daughter-in-law reports that she sometimes forgets to take her medication, but she reminds her regularly.      Medications: Outpatient Medications Prior to Visit  Medication Sig   acetaminophen  (TYLENOL ) 500 MG tablet Take 500 mg by mouth every 6 (six) hours as needed for mild pain.   albuterol  (VENTOLIN  HFA) 108 (90 Base) MCG/ACT inhaler  Inhale 2 puffs into the lungs every 6 (six) hours as needed for wheezing or shortness of breath.   amLODipine  (NORVASC ) 5 MG tablet TAKE 1 TABLET (5 MG TOTAL) BY MOUTH DAILY.   cyanocobalamin  (VITAMIN B12) 100 MCG tablet Take 100 mcg by mouth daily.   famotidine  (PEPCID ) 40 MG tablet Take 40 mg by mouth at bedtime.   ferrous sulfate 325 (65 FE) MG EC tablet Take 325 mg by mouth daily with breakfast.   mometasone  (ELOCON ) 0.1 % cream Apply to aa's shoulders and arms QAM Monday through Friday.   pantoprazole  (PROTONIX ) 40 MG tablet 40 mg 2 (two) times daily. 30 minutes before meals   rosuvastatin  (CRESTOR ) 10 MG tablet TAKE 1 TABLET BY MOUTH EVERY DAY   sertraline  (ZOLOFT ) 50 MG tablet TAKE 1 TABLET BY MOUTH EVERY DAY   No facility-administered medications prior to visit.        Objective    BP (!) 127/58 (BP Location: Right Arm, Patient Position: Sitting)   Pulse 74   Resp 16   Ht 5\' 2"  (1.575 m)   Wt 126 lb 4.8 oz (57.3 kg)   SpO2 98%   BMI 23.10 kg/m     Physical Exam Constitutional:      Appearance: Normal appearance.  HENT:     Head: Normocephalic and atraumatic.  Eyes:     General: No scleral icterus.    Extraocular Movements: Extraocular movements intact.     Conjunctiva/sclera:  Conjunctivae normal.  Cardiovascular:     Rate and Rhythm: Normal rate and regular rhythm.     Pulses: Normal pulses.     Heart sounds: Normal heart sounds.  Pulmonary:     Effort: Pulmonary effort is normal. No respiratory distress.     Breath sounds: Normal breath sounds.  Abdominal:     General: Bowel sounds are normal. There is no distension.     Palpations: Abdomen is soft. There is no mass.     Tenderness: There is no abdominal tenderness. There is no guarding.  Musculoskeletal:     Right lower leg: No edema.     Left lower leg: No edema.  Skin:    General: Skin is warm and dry.  Neurological:     Mental Status: She is alert and oriented to person, place, and time. Mental  status is at baseline.  Psychiatric:        Mood and Affect: Mood normal.        Behavior: Behavior normal.      No results found for any visits on 05/22/23.  Assessment & Plan    Follow-up exam, 3-6 months since previous exam  Depression, recurrent (HCC) Assessment & Plan: Patient's son-in-law reports she is doing well on the increased dose of sertraline . - Continue sertraline  50 mg daily.   COPD, mild (HCC) Assessment & Plan: No acute concerns.  Continue to monitor.    Essential hypertension Assessment & Plan: Chronic, stable. Continue amlodipine  5mg  daily.   Hypercholesteremia Assessment & Plan: Chronic, stable. Continue rosuvastatin  10 mg daily.   Iron deficiency  MCI (mild cognitive impairment)  Stage 3b chronic kidney disease (HCC)    Iron Deficiency Iron saturation low. Iron supplements expected to improve levels. - Continue iron supplementation. - Recheck blood work at next visit.  Mild cognitive impairment Short-term memory issues present. Safety measures in place by family. - Continue current safety measures, including tracker and cameras. - Monitor cognitive function and reassess as needed.  Depression Mood well-managed on sertraline . Dosage increased since November. - Continue sertraline  at current dosage.  Stage 3b chronic kidney disease Noted.  No acute concerns.  Continue to monitor. - Continue to optimize blood pressure and cholesterol levels.  General Health Maintenance Received COVID-19 vaccine recently. Declined newer shingles vaccine. - Encourage continued use of vitamin B12 supplements.  Return in about 3 months (around 08/21/2023) for chronic f/u.      I discussed the assessment and treatment plan with the patient  The patient was provided an opportunity to ask questions and all were answered. The patient agreed with the plan and demonstrated an understanding of the instructions.   The patient was advised to call back or seek  an in-person evaluation if the symptoms worsen or if the condition fails to improve as anticipated.    Carlean Charter, DO  [facility] [phone] (phone) [phone] (fax)  [facility]

## 2023-05-22 NOTE — Assessment & Plan Note (Signed)
No acute concerns.  Continue to monitor.

## 2023-05-30 ENCOUNTER — Encounter: Payer: Self-pay | Admitting: Family Medicine

## 2023-05-30 DIAGNOSIS — N1832 Chronic kidney disease, stage 3b: Secondary | ICD-10-CM | POA: Insufficient documentation

## 2023-06-12 ENCOUNTER — Ambulatory Visit: Admitting: Licensed Clinical Social Worker

## 2023-06-12 NOTE — Patient Instructions (Signed)
 Visit Information  Thank you for taking time to visit with me today. Please don't hesitate to contact me if I can be of assistance to you before our next scheduled appointment.  Client has been provided LCSW name and phone number and can call LCSW as needed for SW support  Please call the care guide team at [phone] if you need to cancel or reschedule your appointment.   Following is a copy of your care plan:   Goals Addressed             This Visit's Progress    VBCI Social Work Care Plan       Problems:   Needs occasional help with transportation             Needs help with bathing and meal preparation            Depression symptoms   CSW Clinical Goal(s):   Over the next 30 days the Patient will attend all scheduled medical appointments as evidenced by patient report and care team review of appointment completion in electronic MEDICAL RECORD NUMBER  .               Over next 30 days, the Patient will use coping skills learned to manage depression symptoms AEB patient report of decreased depression symptoms   Interventions:  Spoke with client via phone today about client needs and status             Discussed medication procurement              Discussed ambulation of client. Client likes to walk for exercise             Discussed coping skills of client. She walks for exercise, she likes to read, she likes watch TV, she likes to go to church activities             Discussed mood of client            Discussed client support with PCP            Discussed client support from daughter, Kathy Pacheco             Encouraged client or daughter, Kathy Pacheco, to call LCSW as needed for SW support for client  Patient Goals/Self-Care Activities:  Attend scheduled medical appointments and take medications as prescribed             Continue to walk for exercise, as she is able            Cooperate with home health aide who helps client as needed in the home             Communicate with  daughter Kathy Pacheco, to discuss client needs            Communicate with PCP as needed to discuss medical needs  Plan:   Client has LCSW name and phone number and has been encouraged to call LCSW for SW support as needed        Please go to [facility] [address], [place] [phone]) if you are experiencing a Mental Health or Behavioral Health Crisis or need someone to talk to.  The patient verbalized understanding of instructions, educational materials, and care plan provided today and DECLINED offer to receive copy of patient instructions, educational materials, and care plan.   Patient provided contact information for Care Team. LCSW provided client with LCSW name and phone number  for client to use for SW support for client   Alexandria Angel  MSW, LCSW [place] Licensed Clinical Social Worker Direct Dial:  [phone] Fax:  [phone] Website:  [email or web address]

## 2023-06-12 NOTE — Patient Outreach (Signed)
 Complex Care Management   Visit Note  06/12/2023  Name:  Kathy Pacheco MRN: [id number] DOB: 28-Apr-1931  Situation: Referral received for Complex Care Management related to  client management of ongoing medical needs  I obtained verbal consent from Patient.  Visit completed with patient  on the phone  Background:   Past Medical History:  Diagnosis Date   Anxiety    Depression    Diverticulosis    GERD (gastroesophageal reflux disease)    Hyperlipidemia    Hypertension    PONV (postoperative nausea and vomiting)    PVD (peripheral vascular disease) (HCC)    Sleep apnea     Assessment: Patient Reported Symptoms:  Cognitive    Occasional memory issues    Neurological  Anxiety; depression    HEENT    No issues noted    Cardiovascular  HTN    Respiratory  Takes rest breaks as needed. Likes to walk for exercise    Endocrine  Vitamin B 12 deficiency; CKD    Gastrointestinal    GERD    Genitourinary  No problems noted    Integumentary  Unable to access    Musculoskeletal    Risk of fall; likes to walk for exercise; takes rest breaks as needed      Psychosocial  No some help with meal preparation; needs occasional transport help. Anxiety; Depression; gets nervous in some social settings     Quality of Family Relationships: involved, supportive      06/12/2023   10:34 AM  Depression screen PHQ 2/9  Decreased Interest 1  Down, Depressed, Hopeless 1  PHQ - 2 Score 2  Altered sleeping 1  Tired, decreased energy 1  Change in appetite 0  Feeling bad or failure about yourself  1  Trouble concentrating 0  Moving slowly or fidgety/restless 1  Suicidal thoughts 0  PHQ-9 Score 6  Difficult doing work/chores Somewhat difficult    Vitals:   Within normal range per client Medications Reviewed Today     Reviewed by Afton Horse (Social Worker) on 06/12/23 at 1032  Med List Status: <None>   Medication Order Taking? Sig Documenting Provider Last Dose  Status Informant  acetaminophen  (TYLENOL ) 500 MG tablet [id number] No Take 500 mg by mouth every 6 (six) hours as needed for mild pain. [provider] Taking Active            Med Note Yisroel Hemp Jan 07, 2017  9:38 PM)    albuterol  (VENTOLIN  HFA) 108 (90 Base) MCG/ACT inhaler [id number] No Inhale 2 puffs into the lungs every 6 (six) hours as needed for wheezing or shortness of breath. Trenton Frock, PA-C Taking Active   amLODipine  (NORVASC ) 5 MG tablet [id number] No TAKE 1 TABLET (5 MG TOTAL) BY MOUTH DAILY. Carlean Charter, DO Taking Active   cyanocobalamin  (VITAMIN B12) 100 MCG tablet [id number] No Take 100 mcg by mouth daily. [provider] Taking Active   famotidine  (PEPCID ) 40 MG tablet [id number] No Take 40 mg by mouth at bedtime. [provider] Taking Active   ferrous sulfate 325 (65 FE) MG EC tablet [id number] No Take 325 mg by mouth daily with breakfast. [provider] Taking Active   mometasone  (ELOCON ) 0.1 % cream [id number] No Apply to aa's shoulders and arms QAM Monday through Friday. Elta Halter, MD Taking Active   pantoprazole  (PROTONIX ) 40 MG tablet [id number] No 40 mg 2 (two) times daily. 30 minutes before meals  [provider] Taking Active   rosuvastatin  (CRESTOR ) 10 MG tablet [id number] No TAKE 1 TABLET BY MOUTH EVERY DAY Pardue, Sarah N, DO Taking Active   sertraline  (ZOLOFT ) 50 MG tablet [id number] No TAKE 1 TABLET BY MOUTH EVERY DAY Pardue, Asencion Blacksmith, DO Taking Active             Recommendation:   Client to attend scheduled medical appointments Client to take medications as prescribed Client to walk for exercise, as she is able\ Client to communicate as needed with PCP to discuss medical needs of client Client to communicate with daughter, Kathy Pacheco, about needs and status of client Client to contact LCSW as needed for SW support  Follow Up Plan:   Client has LCSW name and phone number and has been encouraged  to call LCSW for SW support as needed   Alexandria Angel  MSW, LCSW [place] Licensed Clinical Social Worker Direct Dial:  [phone] Fax:  [phone] Website:  [email or web address]

## 2023-06-27 ENCOUNTER — Encounter: Payer: Self-pay | Admitting: Dermatology

## 2023-06-27 ENCOUNTER — Ambulatory Visit (INDEPENDENT_AMBULATORY_CARE_PROVIDER_SITE_OTHER): Admitting: Dermatology

## 2023-06-27 DIAGNOSIS — L814 Other melanin hyperpigmentation: Secondary | ICD-10-CM | POA: Diagnosis not present

## 2023-06-27 DIAGNOSIS — L570 Actinic keratosis: Secondary | ICD-10-CM

## 2023-06-27 DIAGNOSIS — Z1283 Encounter for screening for malignant neoplasm of skin: Secondary | ICD-10-CM

## 2023-06-27 DIAGNOSIS — L821 Other seborrheic keratosis: Secondary | ICD-10-CM

## 2023-06-27 DIAGNOSIS — W908XXA Exposure to other nonionizing radiation, initial encounter: Secondary | ICD-10-CM | POA: Diagnosis not present

## 2023-06-27 DIAGNOSIS — D229 Melanocytic nevi, unspecified: Secondary | ICD-10-CM

## 2023-06-27 DIAGNOSIS — L578 Other skin changes due to chronic exposure to nonionizing radiation: Secondary | ICD-10-CM

## 2023-06-27 DIAGNOSIS — Z872 Personal history of diseases of the skin and subcutaneous tissue: Secondary | ICD-10-CM | POA: Diagnosis not present

## 2023-06-27 DIAGNOSIS — D1801 Hemangioma of skin and subcutaneous tissue: Secondary | ICD-10-CM | POA: Diagnosis not present

## 2023-06-27 NOTE — Patient Instructions (Addendum)
Actinic keratoses are precancerous spots that appear secondary to cumulative UV radiation exposure/sun exposure over time. They are chronic with expected duration over 1 year. A portion of actinic keratoses will progress to squamous cell carcinoma of the skin. It is not possible to reliably predict which spots will progress to skin cancer and so treatment is recommended to prevent development of skin cancer.  Recommend daily broad spectrum sunscreen SPF 30+ to sun-exposed areas, reapply every 2 hours as needed.  Recommend staying in the shade or wearing long sleeves, sun glasses (UVA+UVB protection) and wide brim hats (4-inch brim around the entire circumference of the hat). Call for new or changing lesions.    Cryotherapy Aftercare  Wash gently with soap and water everyday.   Apply Vaseline and Band-Aid daily until healed.     Melanoma ABCDEs  Melanoma is the most dangerous type of skin cancer, and is the leading cause of death from skin disease.  You are more likely to develop melanoma if you: Have light-colored skin, light-colored eyes, or red or blond hair Spend a lot of time in the sun Tan regularly, either outdoors or in a tanning bed Have had blistering sunburns, especially during childhood Have a close family member who has had a melanoma Have atypical moles or large birthmarks  Early detection of melanoma is key since treatment is typically straightforward and cure rates are extremely high if we catch it early.   The first sign of melanoma is often a change in a mole or a new dark spot.  The ABCDE system is a way of remembering the signs of melanoma.  A for asymmetry:  The two halves do not match. B for border:  The edges of the growth are irregular. C for color:  A mixture of colors are present instead of an even brown color. D for diameter:  Melanomas are usually (but not always) greater than 6mm - the size of a pencil eraser. E for evolution:  The spot keeps changing in  size, shape, and color.  Please check your skin once per month between visits. You can use a small mirror in front and a large mirror behind you to keep an eye on the back side or your body.   If you see any new or changing lesions before your next follow-up, please call to schedule a visit.  Please continue daily skin protection including broad spectrum sunscreen SPF 30+ to sun-exposed areas, reapplying every 2 hours as needed when you're outdoors.   Staying in the shade or wearing long sleeves, sun glasses (UVA+UVB protection) and wide brim hats (4-inch brim around the entire circumference of the hat) are also recommended for sun protection.    Due to recent changes in healthcare laws, you may see results of your pathology and/or laboratory studies on MyChart before the doctors have had a chance to review them. We understand that in some cases there may be results that are confusing or concerning to you. Please understand that not all results are received at the same time and often the doctors may need to interpret multiple results in order to provide you with the best plan of care or course of treatment. Therefore, we ask that you please give [place] 2 business days to thoroughly review all your results before contacting the office for clarification. Should we see a critical lab result, you will be contacted sooner.   If You Need Anything After Your Visit  If you have any questions or concerns for  your doctor, please call our main line at [phone] and press option 4 to reach your doctor's medical assistant. If no one answers, please leave a voicemail as directed and we will return your call as soon as possible. Messages left after 4 pm will be answered the following business day.   You may also send [place] a message via MyChart. We typically respond to MyChart messages within 1-2 business days.  For prescription refills, please ask your pharmacy to contact our office. Our fax number is  [phone].  If you have an urgent issue when the clinic is closed that cannot wait until the next business day, you can page your doctor at the number below.    Please note that while we do our best to be available for urgent issues outside of office hours, we are not available 24/7.   If you have an urgent issue and are unable to reach [place], you may choose to seek medical care at your doctor's office, retail clinic, urgent care center, or emergency room.  If you have a medical emergency, please immediately call 911 or go to the emergency department.  Pager Numbers  - Dr. Gwen Pounds: [phone]  - Dr. Roseanne Reno: [phone]  - Dr. Katrinka Blazing: [phone]   In the event of inclement weather, please call our main line at [phone] for an update on the status of any delays or closures.  Dermatology Medication Tips: Please keep the boxes that topical medications come in in order to help keep track of the instructions about where and how to use these. Pharmacies typically print the medication instructions only on the boxes and not directly on the medication tubes.   If your medication is too expensive, please contact our office at [phone] option 4 or send [place] a message through MyChart.   We are unable to tell what your co-pay for medications will be in advance as this is different depending on your insurance coverage. However, we may be able to find a substitute medication at lower cost or fill out paperwork to get insurance to cover a needed medication.   If a prior authorization is required to get your medication covered by your insurance company, please allow [place] 1-2 business days to complete this process.  Drug prices often vary depending on where the prescription is filled and some pharmacies may offer cheaper prices.  The website www.goodrx.com contains coupons for medications through different pharmacies. The prices here do not account for what the cost may be with help from  insurance (it may be cheaper with your insurance), but the website can give you the price if you did not use any insurance.  - You can print the associated coupon and take it with your prescription to the pharmacy.  - You may also stop by our office during regular business hours and pick up a GoodRx coupon card.  - If you need your prescription sent electronically to a different pharmacy, notify our office through [facility] or by phone at [phone] option 4.     Si Usted Necesita Algo Despus de Su Visita  Tambin puede enviarnos un mensaje a travs de Clinical cytogeneticist. Por lo general respondemos a los mensajes de MyChart en el transcurso de 1 a 2 das hbiles.  Para renovar recetas, por favor pida a su farmacia que se ponga en contacto con nuestra oficina. Annie Sable de fax es [place] [phone].  Si tiene un asunto urgente cuando la clnica est cerrada y que no puede esperar Teacher, adult education  el siguiente da hbil, puede llamar/localizar a su doctor(a) al nmero que aparece a continuacin.   Por favor, tenga en cuenta que aunque hacemos todo lo posible para estar disponibles para asuntos urgentes fuera del horario de [place], no estamos disponibles las 24 horas del da, los 7 [address]  [address] de la [place].   Si tiene un problema urgente y no puede comunicarse con nosotros, puede optar por buscar atencin mdica  en el consultorio de su doctor(a), en una clnica privada, en un centro de atencin urgente o en una sala de emergencias.  Si tiene Engineer, drilling, por favor llame inmediatamente al 911 o vaya a la sala de emergencias.  Nmeros de bper  - Dr. Gwen Pounds: [phone]  - Dra. Roseanne Reno: [phone]  - Dr. Katrinka Blazing: [phone]   En caso de inclemencias del tiempo, por favor llame a Lacy Duverney principal al [phone] para una actualizacin sobre el [place] de cualquier retraso o cierre.  Consejos para la medicacin en dermatologa: Por favor, guarde las cajas en las que vienen los  medicamentos de uso tpico para ayudarle a seguir las instrucciones sobre dnde y cmo usarlos. Las farmacias generalmente imprimen las instrucciones del medicamento slo en las cajas y no directamente en los tubos del [place].   Si su medicamento es muy caro, por favor, pngase en contacto con Rolm Gala llamando al [phone] y presione la opcin 4 o envenos un mensaje a travs de Clinical cytogeneticist.   No podemos decirle cul ser su copago por los medicamentos por adelantado ya que esto es diferente dependiendo de la cobertura de su seguro. Sin embargo, es posible que podamos encontrar un medicamento sustituto a Audiological scientist un formulario para que el seguro cubra el medicamento que se considera necesario.   Si se requiere una autorizacin previa para que su compaa de seguros [place] su medicamento, por favor permtanos de 1 a 2 das hbiles para completar [address]  Los precios de los medicamentos varan con frecuencia dependiendo del Environmental consultant de dnde se surte la receta y alguna farmacias pueden ofrecer precios ms baratos.  El sitio web www.goodrx.com tiene cupones para medicamentos de Health and safety inspector. Los precios aqu no tienen en cuenta lo que podra costar con la ayuda del seguro (puede ser ms barato con su seguro), pero el sitio web puede darle el precio si no utiliz Tourist information centre manager.  - Puede imprimir el cupn correspondiente y llevarlo con su receta a la farmacia.  - Tambin puede pasar por nuestra oficina durante el horario de atencin regular y Education officer, museum una tarjeta de cupones de GoodRx.  - Si necesita que su receta se enve electrnicamente a una farmacia diferente, informe a nuestra oficina a travs de MyChart de [place] o por telfono llamando al [phone] y presione la opcin 4.

## 2023-06-27 NOTE — Progress Notes (Signed)
 Follow-Up Visit   Subjective  Kathy Pacheco is a 89 y.o. 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 The patient has spots, moles and lesions to be evaluated, some may be new or changing and the patient may have concern these could be cancer.  The following portions of the chart were reviewed this encounter and updated as appropriate: medications, allergies, medical history  Review of Systems:  No other skin or systemic complaints except as noted in HPI or Assessment and Plan.  Objective  Well appearing patient in no apparent distress; mood and affect are within normal limits.  A full examination was performed including scalp, head, eyes, ears, nose, lips, neck, chest, axillae, abdomen, back, buttocks, bilateral upper extremities, bilateral lower extremities, hands, feet, fingers, toes, fingernails, and toenails. All findings within normal limits unless otherwise noted below.   Relevant physical exam findings are noted in the Assessment and Plan.  right cheek x 1, left cheek x 1 (2) Erythematous thin papules/macules with gritty scale.   Assessment & Plan   SKIN CANCER SCREENING PERFORMED TODAY.  ACTINIC DAMAGE - Chronic condition, secondary to cumulative UV/sun exposure - diffuse scaly erythematous macules with underlying dyspigmentation - Recommend daily broad spectrum sunscreen SPF 30+ to sun-exposed areas, reapply every 2 hours as needed.  - Staying in the shade or wearing long sleeves, sun glasses (UVA+UVB protection) and wide brim hats (4-inch brim around the entire circumference of the hat) are also recommended for sun protection.  - Call for new or changing lesions.  LENTIGINES, SEBORRHEIC KERATOSES, HEMANGIOMAS - Benign normal skin lesions -  Benign-appearing - Call for any changes  MELANOCYTIC NEVI - Tan-brown and/or pink-flesh-colored symmetric macules and papules - Benign appearing on exam today - Observation - Call clinic for new or changing moles - Recommend daily use of broad spectrum spf 30+ sunscreen to sun-exposed areas.   ACTINIC KERATOSIS (2) right cheek x 1, left cheek x 1 (2) Actinic keratoses are precancerous spots that appear secondary to cumulative UV radiation exposure/sun exposure over time. They are chronic with expected duration over 1 year. A portion of actinic keratoses will progress to squamous cell carcinoma of the skin. It is not possible to reliably predict which spots will progress to skin cancer and so treatment is recommended to prevent development of skin cancer.  Recommend daily broad spectrum sunscreen SPF 30+ to sun-exposed areas, reapply every 2 hours as needed.  Recommend staying in the shade or wearing long sleeves, sun glasses (UVA+UVB protection) and wide brim hats (4-inch brim around the entire circumference of the hat). Call for new or changing lesions. Destruction of lesion - right cheek x 1, left cheek x 1 (2) Complexity: simple   Destruction method: cryotherapy   Informed consent: discussed and consent obtained   Timeout:  patient name, date of birth, surgical site, and procedure verified Lesion destroyed using liquid nitrogen: Yes   Region frozen until ice ball extended beyond lesion: Yes   Outcome: patient tolerated procedure well with no complications   Post-procedure details: wound care instructions given   Return in about 1 year (around 06/26/2024) for TBSE.  Laurelyn Ponder, CMA, am acting as scribe for Celine Collard, MD.   Documentation: I have reviewed the above documentation  for accuracy and completeness, and I agree with the above.  Celine Collard, MD

## 2023-07-17 DIAGNOSIS — N281 Cyst of kidney, acquired: Secondary | ICD-10-CM | POA: Diagnosis not present

## 2023-07-17 DIAGNOSIS — F411 Generalized anxiety disorder: Secondary | ICD-10-CM | POA: Diagnosis not present

## 2023-07-17 DIAGNOSIS — R8290 Unspecified abnormal findings in urine: Secondary | ICD-10-CM | POA: Diagnosis not present

## 2023-07-17 DIAGNOSIS — N1832 Chronic kidney disease, stage 3b: Secondary | ICD-10-CM | POA: Diagnosis not present

## 2023-07-17 DIAGNOSIS — I10 Essential (primary) hypertension: Secondary | ICD-10-CM | POA: Diagnosis not present

## 2023-07-17 DIAGNOSIS — K219 Gastro-esophageal reflux disease without esophagitis: Secondary | ICD-10-CM | POA: Diagnosis not present

## 2023-08-22 ENCOUNTER — Other Ambulatory Visit: Payer: Self-pay | Admitting: Family Medicine

## 2023-08-22 DIAGNOSIS — I10 Essential (primary) hypertension: Secondary | ICD-10-CM

## 2023-08-22 DIAGNOSIS — F339 Major depressive disorder, recurrent, unspecified: Secondary | ICD-10-CM

## 2023-08-22 DIAGNOSIS — G3184 Mild cognitive impairment, so stated: Secondary | ICD-10-CM

## 2023-09-07 ENCOUNTER — Ambulatory Visit: Admitting: Family Medicine

## 2023-09-26 ENCOUNTER — Ambulatory Visit (INDEPENDENT_AMBULATORY_CARE_PROVIDER_SITE_OTHER): Admitting: Family Medicine

## 2023-09-26 ENCOUNTER — Encounter: Payer: Self-pay | Admitting: Family Medicine

## 2023-09-26 VITALS — BP 115/59 | HR 74 | Temp 98.1°F | Ht 63.0 in | Wt 126.8 lb

## 2023-09-26 DIAGNOSIS — E538 Deficiency of other specified B group vitamins: Secondary | ICD-10-CM

## 2023-09-26 DIAGNOSIS — E611 Iron deficiency: Secondary | ICD-10-CM

## 2023-09-26 DIAGNOSIS — N1832 Chronic kidney disease, stage 3b: Secondary | ICD-10-CM | POA: Diagnosis not present

## 2023-09-26 DIAGNOSIS — M67441 Ganglion, right hand: Secondary | ICD-10-CM | POA: Diagnosis not present

## 2023-09-26 DIAGNOSIS — I10 Essential (primary) hypertension: Secondary | ICD-10-CM | POA: Diagnosis not present

## 2023-09-26 NOTE — Progress Notes (Signed)
 Established patient visit   Patient: Kathy Pacheco   DOB: November 17, 1931   89 y.o. Female  MRN: [id number] Visit Date: 09/26/2023  Today's healthcare provider: LAURAINE LOISE BUOY, DO   Chief Complaint  Patient presents with   Hypertension    Hypertension Patient is here for follow-up of elevated blood pressure. She is exercising, walking up and down the drive way. Patient is adherent to a low-salt diet.  Reports that she does monitor her blood pressure at home and well controlled at home.   Medical Management of Chronic Issues    Has a knot on the right hand in the joint of her thumb finger, she states that it doesn't hurt.     Subjective    Hypertension Pertinent negatives include no chest pain, headaches, palpitations or shortness of breath.   Kathy Pacheco is an 89 year old female who presents for a routine follow-up visit. She is accompanied by her son-in-law.  She has a small, non-painful lump on her thumb, which she believes developed after a fall. She describes it as a 'knot' on her hand but states it does not bother her.  No dizziness, lightheadedness, chest pain, shortness of breath, or headaches.  She reports her blood pressure readings at home have been stable, and she has an aide who checks her blood pressure regularly. She uses an albuterol  inhaler as needed but reports no recent breathing difficulties. She is compliant with her medications, including iron and vitamin B12 supplements, and takes blood pressure medication.  She has a history of hearing loss, which she attributes to her family history. She has been using hearing aids for over a year and charges them nightly. She feels they work well for her, though she believes the battery sometimes starts to lose too much power during the day.  She maintains a good appetite and prefers non-preserved foods, often cooking for herself. She receives meals on wheels and describes herself as a 'fairly good cook.' She denies any  recent falls and reports being careful, especially since she does not have stairs to navigate. She does not consume alcohol . No issues with mood, stating she feels 'pretty good for my age' and denies any recent feelings of depression or anxiety.  She lives alone but has supportive neighbors and family, including a daughter, her son-in-law and a lady who comes in during the day to help care for her. She occasionally drives short distances, such as to [employer].        Medications: Outpatient Medications Prior to Visit  Medication Sig   acetaminophen  (TYLENOL ) 500 MG tablet Take 500 mg by mouth every 6 (six) hours as needed for mild pain.   albuterol  (VENTOLIN  HFA) 108 (90 Base) MCG/ACT inhaler Inhale 2 puffs into the lungs every 6 (six) hours as needed for wheezing or shortness of breath.   amLODipine  (NORVASC ) 5 MG tablet TAKE 1 TABLET (5 MG TOTAL) BY MOUTH DAILY.   cyanocobalamin  (VITAMIN B12) 100 MCG tablet Take 100 mcg by mouth daily.   famotidine  (PEPCID ) 40 MG tablet Take 40 mg by mouth at bedtime.   ferrous sulfate 325 (65 FE) MG EC tablet Take 325 mg by mouth daily with breakfast.   mometasone  (ELOCON ) 0.1 % cream Apply to aa's shoulders and arms QAM Monday through Friday.   pantoprazole  (PROTONIX ) 40 MG tablet 40 mg 2 (two) times daily. 30 minutes before meals   rosuvastatin  (CRESTOR ) 10 MG tablet TAKE 1 TABLET BY MOUTH EVERY  DAY   sertraline  (ZOLOFT ) 50 MG tablet TAKE 1 TABLET BY MOUTH EVERY DAY   No facility-administered medications prior to visit.    Review of Systems  Respiratory:  Negative for chest tightness and shortness of breath.   Cardiovascular:  Negative for chest pain and palpitations.  Gastrointestinal:  Negative for abdominal pain, nausea and vomiting.  Neurological:  Negative for dizziness, weakness and headaches.        Objective    BP (!) 115/59 (BP Location: Right Arm, Patient Position: Sitting, Cuff Size: Normal)   Pulse 74   Temp 98.1 F (36.7 C)  (Oral)   Ht 5' 3 (1.6 m)   Wt 126 lb 12.8 oz (57.5 kg)   SpO2 97%   BMI 22.46 kg/m     Physical Exam Constitutional:      Appearance: Normal appearance.  HENT:     Head: Normocephalic and atraumatic.  Eyes:     General: No scleral icterus.    Extraocular Movements: Extraocular movements intact.     Conjunctiva/sclera: Conjunctivae normal.  Cardiovascular:     Rate and Rhythm: Normal rate and regular rhythm.     Pulses: Normal pulses.     Heart sounds: Normal heart sounds.  Pulmonary:     Effort: Pulmonary effort is normal. No respiratory distress.     Breath sounds: Normal breath sounds.  Abdominal:     General: Bowel sounds are normal. There is no distension.     Palpations: Abdomen is soft. There is no mass.     Tenderness: There is no abdominal tenderness. There is no guarding.  Musculoskeletal:     Right lower leg: No edema.     Left lower leg: No edema.  Skin:    General: Skin is warm and dry.     Comments: Small (approx. 7mm x 5mm) mobile, nontender cyst to base of right thumb.  Neurological:     Mental Status: She is alert and oriented to person, place, and time. Mental status is at baseline.  Psychiatric:        Mood and Affect: Mood normal.        Behavior: Behavior normal.      No results found for any visits on 09/26/23.  Assessment & Plan    Essential hypertension  Vitamin B12 deficiency -     Vitamin B12  Iron deficiency -     Iron, TIBC and Ferritin Panel  Stage 3b chronic kidney disease (HCC) -     VITAMIN D 25 Hydroxy (Vit-D Deficiency, Fractures)  Ganglion cyst of finger of right hand      Essential hypertension Blood pressure well-controlled with current medication. No recent dizziness, lightheadedness, chest pain, or shortness of breath. - Continue current blood pressure medication -amlodipine  5 mg daily. - Monitor blood pressure at home and report dizziness or hypotension.  Chronic kidney disease stage 3B Managed by  nephrologist, with recent visit 07/17/2023.  Defer to specialist management. - Order blood work through [facility] to be obtained at next nephrology appointment.  Informed patient's son-in-law of this as well.  Ganglion cyst of hand Ganglion cyst on the thumb, likely benign, not causing pain or discomfort. - Consider surgical removal if cyst becomes bothersome.  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 Difficulty hearing higher-pitched voices noted.  Iron deficiency on oral supplementation Managed with oral iron supplementation. - Continue current iron supplementation. - Recheck iron panel at next nephrology appointment.  Order placed and  requisition form given to patient (patient's son-in-law also informed)    Return in about 6 months (around 03/28/2024) for Chronic f/u.      I discussed the assessment and treatment plan with the patient  The patient was provided an opportunity to ask questions and all were answered. The patient agreed with the plan and demonstrated an understanding of the instructions.   The patient was advised to call back or seek an in-person evaluation if the symptoms worsen or if the condition fails to improve as anticipated.    LAURAINE LOISE BUOY, DO  [facility] [phone] (phone) [phone] (fax)  [facility]

## 2023-10-24 ENCOUNTER — Telehealth: Payer: Self-pay | Admitting: Family Medicine

## 2023-10-24 NOTE — Telephone Encounter (Signed)
 Sending Disability Placard form back for completion.  Call daughter Dagoberto Hummer when completed   [phone]

## 2023-10-26 ENCOUNTER — Other Ambulatory Visit: Payer: Self-pay | Admitting: Family Medicine

## 2023-11-29 DIAGNOSIS — R110 Nausea: Secondary | ICD-10-CM | POA: Diagnosis not present

## 2023-11-29 DIAGNOSIS — K2100 Gastro-esophageal reflux disease with esophagitis, without bleeding: Secondary | ICD-10-CM | POA: Diagnosis not present

## 2023-12-11 ENCOUNTER — Telehealth: Payer: Self-pay

## 2023-12-11 NOTE — Telephone Encounter (Signed)
 Called patient's daughter back and advised her Dr. Darlena recommendations.

## 2023-12-11 NOTE — Telephone Encounter (Signed)
 Patient's daughter,  Dagoberto Hummer called and left voicemail asking for suggestions/recommendations on how to protect neck from rope on life line patient wears around her neck. Please advise

## 2023-12-20 DIAGNOSIS — E872 Acidosis, unspecified: Secondary | ICD-10-CM | POA: Diagnosis not present

## 2023-12-20 DIAGNOSIS — F411 Generalized anxiety disorder: Secondary | ICD-10-CM | POA: Diagnosis not present

## 2023-12-20 DIAGNOSIS — I10 Essential (primary) hypertension: Secondary | ICD-10-CM | POA: Diagnosis not present

## 2023-12-20 DIAGNOSIS — K219 Gastro-esophageal reflux disease without esophagitis: Secondary | ICD-10-CM | POA: Diagnosis not present

## 2023-12-20 DIAGNOSIS — E611 Iron deficiency: Secondary | ICD-10-CM | POA: Diagnosis not present

## 2023-12-20 DIAGNOSIS — N1832 Chronic kidney disease, stage 3b: Secondary | ICD-10-CM | POA: Diagnosis not present

## 2023-12-20 DIAGNOSIS — E538 Deficiency of other specified B group vitamins: Secondary | ICD-10-CM | POA: Diagnosis not present

## 2023-12-20 DIAGNOSIS — R8290 Unspecified abnormal findings in urine: Secondary | ICD-10-CM | POA: Diagnosis not present

## 2023-12-20 DIAGNOSIS — N281 Cyst of kidney, acquired: Secondary | ICD-10-CM | POA: Diagnosis not present

## 2023-12-20 LAB — BASIC METABOLIC PANEL WITH GFR
BUN: 28 — AB (ref 4–21)
CO2: 30 — AB (ref 13–22)
Chloride: 103 (ref 99–108)
Creatinine: 1.2 — AB (ref 0.5–1.1)
Glucose: 87
Potassium: 4.2 meq/L (ref 3.5–5.1)
Sodium: 139 (ref 137–147)

## 2023-12-20 LAB — PROTEIN / CREATININE RATIO, URINE: Creatinine, Urine: 125

## 2023-12-20 LAB — CBC AND DIFFERENTIAL
HCT: 39 (ref 36–46)
Hemoglobin: 12.5 (ref 12.0–16.0)
Neutrophils Absolute: 3926
Platelets: 308 10*3/uL (ref 150–400)
WBC: 6.5

## 2023-12-20 LAB — CBC: RBC: 4.18 (ref 3.87–5.11)

## 2023-12-20 LAB — COMPREHENSIVE METABOLIC PANEL WITH GFR
Albumin: 3.9 (ref 3.5–5.0)
Calcium: 9.6 (ref 8.7–10.7)
eGFR: 43

## 2023-12-20 LAB — VITAMIN D 25 HYDROXY (VIT D DEFICIENCY, FRACTURES): Vit D, 25-Hydroxy: 34

## 2023-12-20 LAB — IRON,TIBC AND FERRITIN PANEL
%SAT: 21
Iron: 67
TIBC: 318

## 2023-12-20 LAB — VITAMIN B12: Vitamin B-12: 472

## 2023-12-26 DIAGNOSIS — R809 Proteinuria, unspecified: Secondary | ICD-10-CM | POA: Diagnosis not present

## 2023-12-26 DIAGNOSIS — I10 Essential (primary) hypertension: Secondary | ICD-10-CM | POA: Diagnosis not present

## 2023-12-26 DIAGNOSIS — N1832 Chronic kidney disease, stage 3b: Secondary | ICD-10-CM | POA: Diagnosis not present

## 2023-12-26 DIAGNOSIS — E785 Hyperlipidemia, unspecified: Secondary | ICD-10-CM | POA: Diagnosis not present

## 2023-12-27 ENCOUNTER — Other Ambulatory Visit: Payer: Self-pay | Admitting: Family Medicine

## 2024-03-09 ENCOUNTER — Other Ambulatory Visit: Payer: Self-pay | Admitting: Family Medicine

## 2024-03-09 DIAGNOSIS — I10 Essential (primary) hypertension: Secondary | ICD-10-CM

## 2024-03-09 DIAGNOSIS — G3184 Mild cognitive impairment, so stated: Secondary | ICD-10-CM

## 2024-03-09 DIAGNOSIS — F339 Major depressive disorder, recurrent, unspecified: Secondary | ICD-10-CM

## 2024-03-10 ENCOUNTER — Telehealth: Payer: Self-pay

## 2024-03-10 NOTE — Progress Notes (Unsigned)
" °  ° ° °  Established patient visit   Patient: Kathy Pacheco   DOB: [birth year]   89 y.o. Female  MRN: [id number] Visit Date: 03/11/2024  Today's healthcare provider: LAURAINE LOISE BUOY, DO   No chief complaint on file.  Subjective    HPI     Encourage respite care while daughter is out of the country!  [place]  UA for cog changes *** Patient's daughter Dagoberto is wanting to get patient into respite care at [facility] while they are out of the country. PCP will need to fill out FL2 form and encourage this with patient to be followed by patinent. Dagoberto is leaving country 2/24 and returning 3/11. She is planning to discuss this during the visit tomorrow (03/11/24), but she is worried about being able to get out of the driveway with the weather. They have been speaking to [facility] at [place].     {History (Optional):23778}  Medications: Show/hide medication list[1]  Review of Systems ***  {Insert previous labs (optional):23779} {See past labs  Heme  Chem  Endocrine  Serology  Results Review (optional):1}   Objective    There were no vitals taken for this visit. {Insert last BP/Wt (optional):23777}{See vitals history (optional):1}   Physical Exam   No results found for any visits on 03/11/24.  Assessment & Plan    Cognitive changes    ***  No follow-ups on file.      I discussed the assessment and treatment plan with the patient  The patient was provided an opportunity to ask questions and all were answered. The patient agreed with the plan and demonstrated an understanding of the instructions.   The patient was advised to call back or seek an in-person evaluation if the symptoms worsen or if the condition fails to improve as anticipated.    LAURAINE LOISE BUOY, DO  [facility] [phone] (phone) [phone] (fax)   Medical Group    [1]  Outpatient Medications Prior to Visit  Medication Sig   acetaminophen  (TYLENOL )  500 MG tablet Take 500 mg by mouth every 6 (six) hours as needed for mild pain.   albuterol  (VENTOLIN  HFA) 108 (90 Base) MCG/ACT inhaler Inhale 2 puffs into the lungs every 6 (six) hours as needed for wheezing or shortness of breath.   amLODipine  (NORVASC ) 5 MG tablet TAKE 1 TABLET (5 MG TOTAL) BY MOUTH DAILY.   cyanocobalamin  (VITAMIN B12) 100 MCG tablet Take 100 mcg by mouth daily.   famotidine  (PEPCID ) 40 MG tablet Take 40 mg by mouth at bedtime.   ferrous sulfate 325 (65 FE) MG EC tablet Take 325 mg by mouth daily with breakfast.   mometasone  (ELOCON ) 0.1 % cream Apply to aa's shoulders and arms QAM Monday through Friday.   pantoprazole  (PROTONIX ) 40 MG tablet 40 mg 2 (two) times daily. 30 minutes before meals   rosuvastatin  (CRESTOR ) 10 MG tablet TAKE 1 TABLET BY MOUTH EVERY DAY   sertraline  (ZOLOFT ) 50 MG tablet TAKE 1 TABLET BY MOUTH EVERY DAY   No facility-administered medications prior to visit.   "

## 2024-03-10 NOTE — Telephone Encounter (Signed)
 Copied from CRM [id number]. Topic: General - Other >> Mar 10, 2024 10:16 AM Corin V wrote: Reason for CRM: Patient's daughter Dagoberto is wanting to get patient into respite care at [facility] while they are out of the country. PCP will need to fill out FL2 form and encourage this with patient to be followed by patinent. Dagoberto is leaving country 2/24 and returning 3/11. She is planning to discuss this during the visit tomorrow (03/11/24), but she is worried about being able to get out of the driveway with the weather. They have been speaking to [place] at [facility].

## 2024-03-11 ENCOUNTER — Ambulatory Visit: Payer: Self-pay

## 2024-03-11 ENCOUNTER — Encounter: Payer: Self-pay | Admitting: Family Medicine

## 2024-03-11 ENCOUNTER — Ambulatory Visit: Admitting: Family Medicine

## 2024-03-11 VITALS — BP 110/68 | Ht 62.0 in | Wt 128.4 lb

## 2024-03-11 VITALS — BP 110/68 | HR 68 | Temp 97.5°F | Ht 62.0 in | Wt 128.4 lb

## 2024-03-11 DIAGNOSIS — J449 Chronic obstructive pulmonary disease, unspecified: Secondary | ICD-10-CM

## 2024-03-11 DIAGNOSIS — Z0000 Encounter for general adult medical examination without abnormal findings: Secondary | ICD-10-CM

## 2024-03-11 DIAGNOSIS — D649 Anemia, unspecified: Secondary | ICD-10-CM | POA: Diagnosis not present

## 2024-03-11 DIAGNOSIS — F3341 Major depressive disorder, recurrent, in partial remission: Secondary | ICD-10-CM | POA: Diagnosis not present

## 2024-03-11 DIAGNOSIS — M810 Age-related osteoporosis without current pathological fracture: Secondary | ICD-10-CM

## 2024-03-11 DIAGNOSIS — H9190 Unspecified hearing loss, unspecified ear: Secondary | ICD-10-CM | POA: Diagnosis not present

## 2024-03-11 DIAGNOSIS — E7800 Pure hypercholesterolemia, unspecified: Secondary | ICD-10-CM | POA: Diagnosis not present

## 2024-03-11 DIAGNOSIS — I10 Essential (primary) hypertension: Secondary | ICD-10-CM | POA: Diagnosis not present

## 2024-03-11 DIAGNOSIS — R4189 Other symptoms and signs involving cognitive functions and awareness: Secondary | ICD-10-CM | POA: Diagnosis not present

## 2024-03-11 DIAGNOSIS — F03A4 Unspecified dementia, mild, with anxiety: Secondary | ICD-10-CM

## 2024-03-11 DIAGNOSIS — N1832 Chronic kidney disease, stage 3b: Secondary | ICD-10-CM | POA: Diagnosis not present

## 2024-03-11 DIAGNOSIS — K219 Gastro-esophageal reflux disease without esophagitis: Secondary | ICD-10-CM

## 2024-03-11 LAB — POCT URINALYSIS DIPSTICK
Bilirubin, UA: NEGATIVE
Blood, UA: NEGATIVE
Glucose, UA: NEGATIVE
Ketones, UA: NEGATIVE
Nitrite, UA: NEGATIVE
Protein, UA: POSITIVE — AB
Spec Grav, UA: 1.025
Urobilinogen, UA: 0.2 U/dL
pH, UA: 6

## 2024-03-11 NOTE — Patient Instructions (Addendum)
 Kathy Pacheco,  Thank you for taking the time for your Medicare Wellness Visit. I appreciate your continued commitment to your health goals. Please review the care plan we discussed, and feel free to reach out if I can assist you further.  Please note that Annual Wellness Visits do not include a physical exam. Some assessments may be limited, especially if the visit was conducted virtually. If needed, we may recommend an in-person follow-up with your provider.  Ongoing Care Seeing your primary care provider every 3 to 6 months helps us  monitor your health and provide consistent, personalized care. 03/11/24 @ 2:40 PM APPT W/ DR.PARDUE  Referrals If a referral was made during today's visit and you haven't received any updates within two weeks, please contact the referred provider directly to check on the status.  Recommended Screenings:  Health Maintenance  Topic Date Due   Osteoporosis screening with Bone Density Scan  04/03/2020   COVID-19 Vaccine (4 - 2025-26 season) 10/08/2023   Medicare Annual Wellness Visit  03/05/2024   Flu Shot  05/06/2024*   Zoster (Shingles) Vaccine (1 of 2) 05/21/2024*   DTaP/Tdap/Td vaccine (3 - Td or Tdap) 09/21/2030   Pneumococcal Vaccine for age over 54  Completed   Meningitis B Vaccine  Aged Out  *Topic was postponed. The date shown is not the original due date.     Vision: Annual vision screenings are recommended for early detection of glaucoma, cataracts, and diabetic retinopathy. These exams can also reveal signs of chronic conditions such as diabetes and high blood pressure.  Dental: Annual dental screenings help detect early signs of oral cancer, gum disease, and other conditions linked to overall health, including heart disease and diabetes.  Please see the attached documents for additional preventive care recommendations.   NEXT AWV 03/17/25 @ 2:30 PM IN PERSON

## 2024-03-12 ENCOUNTER — Encounter: Payer: Self-pay | Admitting: Family Medicine

## 2024-03-12 ENCOUNTER — Telehealth: Payer: Self-pay

## 2024-03-12 NOTE — Telephone Encounter (Signed)
 Copied from CRM [id number]. Topic: Clinical - Lab/Test Results >> Mar 12, 2024  4:27 PM Everette C wrote: Reason for CRM: The patient's daughter would like to be contacted to review their most recent urinalysis results when possible with a member of practice clinical staff

## 2024-03-13 LAB — URINE CULTURE

## 2024-03-28 ENCOUNTER — Ambulatory Visit: Admitting: Family Medicine

## 2024-07-02 ENCOUNTER — Ambulatory Visit: Admitting: Dermatology

## 2024-09-09 ENCOUNTER — Encounter

## 2025-03-17 ENCOUNTER — Ambulatory Visit
